# Patient Record
Sex: Female | Born: 1995 | Race: Black or African American | Hispanic: No | Marital: Single | Smoking: Never smoker
Health system: Southern US, Community
[De-identification: ages and names within clinical notes are randomized; demographics above are authoritative.]

## PROBLEM LIST (undated history)

## (undated) DIAGNOSIS — D150 Benign neoplasm of thymus: Secondary | ICD-10-CM

## (undated) DIAGNOSIS — J189 Pneumonia, unspecified organism: Secondary | ICD-10-CM

## (undated) DIAGNOSIS — D4989 Neoplasm of unspecified behavior of other specified sites: Secondary | ICD-10-CM

## (undated) DIAGNOSIS — E282 Polycystic ovarian syndrome: Secondary | ICD-10-CM

## (undated) DIAGNOSIS — G7000 Myasthenia gravis without (acute) exacerbation: Secondary | ICD-10-CM

## (undated) DIAGNOSIS — J40 Bronchitis, not specified as acute or chronic: Secondary | ICD-10-CM

## (undated) HISTORY — DX: Benign neoplasm of thymus: D15.0

## (undated) HISTORY — DX: Neoplasm of unspecified behavior of other specified sites: D49.89

## (undated) HISTORY — PX: OTHER SURGICAL HISTORY: SHX169

## (undated) HISTORY — DX: Myasthenia gravis without (acute) exacerbation: G70.00

## (undated) HISTORY — DX: Polycystic ovarian syndrome: E28.2

---
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
## 2016-02-04 ENCOUNTER — Encounter (HOSPITAL_COMMUNITY): Payer: Self-pay | Admitting: Emergency Medicine

## 2016-02-04 DIAGNOSIS — N946 Dysmenorrhea, unspecified: Secondary | ICD-10-CM | POA: Insufficient documentation

## 2016-02-04 LAB — URINE MICROSCOPIC-ADD ON

## 2016-02-04 LAB — COMPREHENSIVE METABOLIC PANEL
ALK PHOS: 65 U/L (ref 38–126)
ALT: 13 U/L — AB (ref 14–54)
ANION GAP: 7 (ref 5–15)
AST: 21 U/L (ref 15–41)
Albumin: 3.6 g/dL (ref 3.5–5.0)
BUN: 7 mg/dL (ref 6–20)
CALCIUM: 9.1 mg/dL (ref 8.9–10.3)
CHLORIDE: 106 mmol/L (ref 101–111)
CO2: 23 mmol/L (ref 22–32)
CREATININE: 0.7 mg/dL (ref 0.44–1.00)
Glucose, Bld: 170 mg/dL — ABNORMAL HIGH (ref 65–99)
Potassium: 4 mmol/L (ref 3.5–5.1)
Sodium: 136 mmol/L (ref 135–145)
Total Bilirubin: 0.2 mg/dL — ABNORMAL LOW (ref 0.3–1.2)
Total Protein: 7.3 g/dL (ref 6.5–8.1)

## 2016-02-04 LAB — CBC
HCT: 39.4 % (ref 36.0–46.0)
Hemoglobin: 12.8 g/dL (ref 12.0–15.0)
MCH: 27.8 pg (ref 26.0–34.0)
MCHC: 32.5 g/dL (ref 30.0–36.0)
MCV: 85.7 fL (ref 78.0–100.0)
PLATELETS: 278 10*3/uL (ref 150–400)
RBC: 4.6 MIL/uL (ref 3.87–5.11)
RDW: 13.4 % (ref 11.5–15.5)
WBC: 6.1 10*3/uL (ref 4.0–10.5)

## 2016-02-04 LAB — URINALYSIS, ROUTINE W REFLEX MICROSCOPIC
Bilirubin Urine: NEGATIVE
Glucose, UA: NEGATIVE mg/dL
Ketones, ur: 15 mg/dL — AB
Nitrite: NEGATIVE
PROTEIN: 100 mg/dL — AB
Specific Gravity, Urine: 1.009 (ref 1.005–1.030)
pH: 6.5 (ref 5.0–8.0)

## 2016-02-04 LAB — LIPASE, BLOOD: LIPASE: 30 U/L (ref 11–51)

## 2016-02-04 LAB — POC URINE PREG, ED: PREG TEST UR: NEGATIVE

## 2016-02-04 NOTE — ED Triage Notes (Signed)
Pt. reports low abdominal pain with nausea onset today , denies emesis or diarrhea , no fever or chills.

## 2016-02-05 ENCOUNTER — Emergency Department (HOSPITAL_COMMUNITY)
Admission: EM | Admit: 2016-02-05 | Discharge: 2016-02-05 | Disposition: A | Discharge status: Home / Self Care | Payer: PRIVATE HEALTH INSURANCE | Attending: Emergency Medicine | Admitting: Emergency Medicine

## 2016-02-05 DIAGNOSIS — N946 Dysmenorrhea, unspecified: Secondary | ICD-10-CM

## 2016-02-05 MED ORDER — KETOROLAC TROMETHAMINE 60 MG/2ML IM SOLN
60.0000 mg | Freq: Once | INTRAMUSCULAR | Status: AC
  Administered 2016-02-05: 60 mg via INTRAMUSCULAR
  Filled 2016-02-05: qty 2

## 2016-02-05 NOTE — ED Provider Notes (Signed)
[facility] DEPT Provider Note   CSN: [id number] Arrival date & time: 02/04/16  2231     History   Chief Complaint Chief Complaint  Patient presents with  . Abdominal Pain    HPI Suzanne Nguyen is a 20 y.o. female.  HPI   Patient to the ER with PMH of bronchitis and pneumonia.  She has hx of PCOS, says her PCP put her on birth control 1 month ago and this is her first menstrual cycle. She is concerned because she has been having cramping with her periods, while in the shower she passed a large clot and is concerned her so she came to the ER. She has not continued to bleed a lot and she has not had any more significant cramping. She has not had vaginal discharge or n/v/d. She has never had sex before.   Past Medical History:  Diagnosis Date  . Bronchitis   . Pneumonia     There are no active problems to display for this patient.   History reviewed. No pertinent surgical history.  OB History    No data available       Home Medications    Prior to Admission medications   Medication Sig Start Date End Date Taking? Authorizing Provider  acetaminophen (TYLENOL) 500 MG tablet Take 1,000 mg by mouth every 6 (six) hours as needed for mild pain.   Yes Historical Provider, MD  albuterol (PROVENTIL HFA;VENTOLIN HFA) 108 (90 BASE) MCG/ACT inhaler Inhale 2 puffs into the lungs every 4 (four) hours as needed for wheezing or shortness of breath. 04/06/15  Yes Melony Overly, MD  desogestrel-ethinyl estradiol (APRI) 0.15-30 MG-MCG tablet Take 1 tablet by mouth daily.   Yes Historical Provider, MD  Multiple Vitamin (MULTIVITAMIN WITH MINERALS) TABS tablet Take 1 tablet by mouth daily.   Yes Historical Provider, MD    Family History No family history on file.  Social History Social History  Substance Use Topics  . Smoking status: Never Smoker  . Smokeless tobacco: Not on file  . Alcohol use No     Allergies   Review of patient's allergies indicates no known  allergies.   Review of Systems Review of Systems Review of Systems All other systems negative except as documented in the HPI. All pertinent positives and negatives as reviewed in the HPI.   Physical Exam Updated Vital Signs BP 143/93 (BP Location: Left Arm)   Pulse 76   Temp 98.3 F (36.8 C) (Oral)   Resp 16   Ht 5\' 3"  (1.6 m)   Wt 98 kg   LMP 02/02/2016   SpO2 97%   BMI 38.26 kg/m   Physical Exam  Constitutional: She appears well-developed and well-nourished. No distress.  HENT:  Head: Normocephalic and atraumatic.  Eyes: Pupils are equal, round, and reactive to light.  Neck: Normal range of motion. Neck supple.  Cardiovascular: Normal rate and regular rhythm.   Pulmonary/Chest: Effort normal.  Abdominal: Soft.  Neurological: She is alert.  Skin: Skin is warm and dry.  Nursing note and vitals reviewed.    ED Treatments / Results  Labs (all labs ordered are listed, but only abnormal results are displayed) Labs Reviewed  COMPREHENSIVE METABOLIC PANEL - Abnormal; Notable for the following:       Result Value   Glucose, Bld 170 (*)    ALT 13 (*)    Total Bilirubin 0.2 (*)    All other components within normal limits  URINALYSIS, ROUTINE W REFLEX MICROSCOPIC (  NOT AT [facility]) - Abnormal; Notable for the following:    Color, Urine RED (*)    APPearance CLOUDY (*)    Hgb urine dipstick LARGE (*)    Ketones, ur 15 (*)    Protein, ur 100 (*)    Leukocytes, UA SMALL (*)    All other components within normal limits  URINE MICROSCOPIC-ADD ON - Abnormal; Notable for the following:    Squamous Epithelial / LPF 0-5 (*)    Bacteria, UA RARE (*)    All other components within normal limits  WET PREP, GENITAL  LIPASE, BLOOD  CBC  POC URINE PREG, ED  GC/CHLAMYDIA PROBE AMP ([place]) NOT AT [facility]    EKG  EKG Interpretation None       Radiology No results found.  Procedures Procedures (including critical care time)  Medications Ordered in ED Medications   ketorolac (TORADOL) injection 60 mg (not administered)     Initial Impression / Assessment and Plan / ED Course  I have reviewed the triage vital signs and the nursing notes.  Pertinent labs & imaging results that were available during my care of the patient were reviewed by me and considered in my medical decision making (see chart for details).  Clinical Course   Patient says she is afraid a pelvic will hurt and refuses pelvic exam, she also refuses to do single swabs instead of speculum because she said she is afraid it will hurt and says the process "is too weird". Her lab work is unremarkable. I advise the patient to call her PCP on Monday to let them know she is cramping. I recommend Ibuprofen and Tylenol for pain. -- discussed with Dr. Tamera Punt prior to discharge.  Medications  ketorolac (TORADOL) injection 60 mg (not administered)   I discussed results, diagnoses and plan with Henry Bias. They voice there understanding and questions were answered. We discussed follow-up recommendations and return precautions.   Final Clinical Impressions(s) / ED Diagnoses   Final diagnoses:  Dysmenorrhea    New Prescriptions New Prescriptions   No medications on file     Delos Haring, PA-C 02/05/16 0151    Malvin Johns, MD 02/05/16 [phone]

## 2016-07-11 ENCOUNTER — Encounter: Payer: Self-pay | Admitting: Physician Assistant

## 2016-07-11 ENCOUNTER — Ambulatory Visit (INDEPENDENT_AMBULATORY_CARE_PROVIDER_SITE_OTHER): Payer: Managed Care, Other (non HMO) | Admitting: Physician Assistant

## 2016-07-11 VITALS — BP 106/76 | HR 79 | Temp 98.3°F | Resp 20 | Ht 64.0 in | Wt 217.4 lb

## 2016-07-11 DIAGNOSIS — E282 Polycystic ovarian syndrome: Secondary | ICD-10-CM

## 2016-07-11 MED ORDER — DESOGESTREL-ETHINYL ESTRADIOL 0.15-30 MG-MCG PO TABS: 1.0000 | ORAL_TABLET | Freq: Every day | ORAL | 4 refills | Status: DC

## 2016-07-11 NOTE — Patient Instructions (Signed)
My fitness PAL to help with eating   Sign up for mychart as that will be a way for you to contact me if you need me.

## 2016-07-11 NOTE — Progress Notes (Signed)
   Suzanne Nguyen  MRN: [id number] DOB: 10-10-1995  Subjective:  Pt presents to clinic for birth control refill - she is on OCP for PCOS - it controls her menses and helps decrease her flow due to monthly cycles.  She has never been sexually active.  She has not been able to get back with the gyn who started her OCP - she ran out of her OCP in November and she has had really heavy menses since then.  She has started to exercise and she is having chest wall pain with movement that is intermittent and she is interested in getting some guidance on good eating habits.  Review of Systems  Constitutional: Negative for chills and fever.  Genitourinary: Positive for menstrual problem. Negative for dysuria, frequency, urgency and vaginal discharge.   Patient Active Problem List   Diagnosis Date Noted  . Polycystic ovarian syndrome 12/27/2015  . Hirsutism 12/24/2015    Current Outpatient Prescriptions on File Prior to Visit  Medication Sig Dispense Refill  . albuterol (PROVENTIL HFA;VENTOLIN HFA) 108 (90 BASE) MCG/ACT inhaler Inhale 2 puffs into the lungs every 4 (four) hours as needed for wheezing or shortness of breath. 1 Inhaler 0  . Multiple Vitamin (MULTIVITAMIN WITH MINERALS) TABS tablet Take 1 tablet by mouth daily.     No current facility-administered medications on file prior to visit.     No Known Allergies  Pt patients past, family and social history were reviewed and updated.   Objective:  BP 106/76 (BP Location: Left Arm, Patient Position: Sitting, Cuff Size: Large)   Pulse 79   Temp 98.3 F (36.8 C)   Resp 20   Ht 5\' 4"  (1.626 m)   Wt 217 lb 6.4 oz (98.6 kg)   LMP 07/04/2015   SpO2 98%   BMI 37.32 kg/m   Physical Exam  Constitutional: She is oriented to person, place, and time and well-developed, well-nourished, and in no distress.  HENT:  Head: Normocephalic and atraumatic.  Right Ear: Hearing and external ear normal.  Left Ear: Hearing and external ear normal.   Eyes: Conjunctivae are normal.  Neck: Normal range of motion.  Cardiovascular: Normal rate, regular rhythm and normal heart sounds.   No murmur heard. Pulmonary/Chest: Effort normal and breath sounds normal. She has no wheezes.  Neurological: She is alert and oriented to person, place, and time. Gait normal.  Skin: Skin is warm and dry.  Psychiatric: Mood, memory, affect and judgment normal.  Vitals reviewed.   Assessment and Plan :  PCOS (polycystic ovarian syndrome) - Plan: desogestrel-ethinyl estradiol (APRI) 0.15-30 MG-MCG tablet - refill OCP - pt has been out but she can do a quick start and that was discussed with patient - she was given guidance on healthy eating esp for weight loss - she should continue to exercise and try to increase her water as that has helped that pain she has had in the past.  Windell Hummingbird PA-C  [facility] 07/11/2016 6:14 PM

## 2017-08-19 ENCOUNTER — Encounter (HOSPITAL_COMMUNITY): Payer: Self-pay | Admitting: *Deleted

## 2017-08-19 ENCOUNTER — Other Ambulatory Visit: Payer: Self-pay

## 2017-08-19 ENCOUNTER — Emergency Department (HOSPITAL_COMMUNITY)
Admission: EM | Admit: 2017-08-19 | Discharge: 2017-08-19 | Disposition: A | Discharge status: Home / Self Care | Payer: Managed Care, Other (non HMO) | Attending: Emergency Medicine | Admitting: Emergency Medicine

## 2017-08-19 DIAGNOSIS — M6281 Muscle weakness (generalized): Secondary | ICD-10-CM | POA: Diagnosis not present

## 2017-08-19 DIAGNOSIS — Z5321 Procedure and treatment not carried out due to patient leaving prior to being seen by health care provider: Secondary | ICD-10-CM | POA: Insufficient documentation

## 2017-08-19 DIAGNOSIS — R51 Headache: Secondary | ICD-10-CM | POA: Insufficient documentation

## 2017-08-19 NOTE — ED Notes (Signed)
Patient called and no answer. 

## 2017-08-19 NOTE — ED Notes (Signed)
Called patient and no answer.

## 2017-08-19 NOTE — ED Triage Notes (Signed)
Pt states she has possible rt ring trigger finger, rt arm weakness with poor motor skills, which are not new but has not followed up with her PMD, Pt states she has a headache, pt continues to ramble in conversation and difficult to assess the reason she is here, Symptoms since Feb

## 2017-08-21 ENCOUNTER — Observation Stay (HOSPITAL_COMMUNITY)
Admission: EM | Admit: 2017-08-21 | Discharge: 2017-08-24 | Disposition: A | Discharge status: Home / Self Care | Payer: Managed Care, Other (non HMO) | Attending: Internal Medicine | Admitting: Internal Medicine

## 2017-08-21 ENCOUNTER — Emergency Department (HOSPITAL_COMMUNITY): Payer: Managed Care, Other (non HMO)

## 2017-08-21 ENCOUNTER — Encounter (HOSPITAL_COMMUNITY): Payer: Self-pay | Admitting: Emergency Medicine

## 2017-08-21 ENCOUNTER — Other Ambulatory Visit: Payer: Self-pay

## 2017-08-21 DIAGNOSIS — H539 Unspecified visual disturbance: Secondary | ICD-10-CM

## 2017-08-21 DIAGNOSIS — Z6839 Body mass index (BMI) 39.0-39.9, adult: Secondary | ICD-10-CM | POA: Insufficient documentation

## 2017-08-21 DIAGNOSIS — R296 Repeated falls: Secondary | ICD-10-CM | POA: Diagnosis not present

## 2017-08-21 DIAGNOSIS — H02409 Unspecified ptosis of unspecified eyelid: Secondary | ICD-10-CM | POA: Diagnosis present

## 2017-08-21 DIAGNOSIS — H02401 Unspecified ptosis of right eyelid: Principal | ICD-10-CM | POA: Insufficient documentation

## 2017-08-21 DIAGNOSIS — H532 Diplopia: Secondary | ICD-10-CM | POA: Diagnosis present

## 2017-08-21 DIAGNOSIS — R531 Weakness: Secondary | ICD-10-CM | POA: Diagnosis not present

## 2017-08-21 DIAGNOSIS — R2681 Unsteadiness on feet: Secondary | ICD-10-CM | POA: Diagnosis not present

## 2017-08-21 DIAGNOSIS — R222 Localized swelling, mass and lump, trunk: Secondary | ICD-10-CM | POA: Insufficient documentation

## 2017-08-21 DIAGNOSIS — E282 Polycystic ovarian syndrome: Secondary | ICD-10-CM | POA: Diagnosis present

## 2017-08-21 DIAGNOSIS — E669 Obesity, unspecified: Secondary | ICD-10-CM | POA: Diagnosis present

## 2017-08-21 DIAGNOSIS — R29898 Other symptoms and signs involving the musculoskeletal system: Secondary | ICD-10-CM | POA: Diagnosis present

## 2017-08-21 LAB — URINALYSIS, ROUTINE W REFLEX MICROSCOPIC
BILIRUBIN URINE: NEGATIVE
Glucose, UA: NEGATIVE mg/dL
Hgb urine dipstick: NEGATIVE
KETONES UR: NEGATIVE mg/dL
LEUKOCYTES UA: NEGATIVE
NITRITE: NEGATIVE
Protein, ur: NEGATIVE mg/dL
Specific Gravity, Urine: 1.005 (ref 1.005–1.030)
pH: 6 (ref 5.0–8.0)

## 2017-08-21 LAB — RAPID URINE DRUG SCREEN, HOSP PERFORMED
Amphetamines: NOT DETECTED
BENZODIAZEPINES: NOT DETECTED
Barbiturates: NOT DETECTED
COCAINE: NOT DETECTED
OPIATES: NOT DETECTED
Tetrahydrocannabinol: NOT DETECTED

## 2017-08-21 LAB — BASIC METABOLIC PANEL
Anion gap: 9 (ref 5–15)
CO2: 28 mmol/L (ref 22–32)
CREATININE: 0.59 mg/dL (ref 0.44–1.00)
Calcium: 9.7 mg/dL (ref 8.9–10.3)
Chloride: 103 mmol/L (ref 101–111)
GFR calc Af Amer: >60 mL/min (ref >60)
GFR calc non Af Amer: >60 mL/min (ref >60)
GLUCOSE: 105 mg/dL — AB (ref 65–99)
POTASSIUM: 4 mmol/L (ref 3.5–5.1)
SODIUM: 140 mmol/L (ref 135–145)

## 2017-08-21 LAB — CBC
HEMATOCRIT: 40.2 % (ref 36.0–46.0)
Hemoglobin: 13 g/dL (ref 12.0–15.0)
MCH: 27.5 pg (ref 26.0–34.0)
MCHC: 32.3 g/dL (ref 30.0–36.0)
MCV: 85.2 fL (ref 78.0–100.0)
PLATELETS: 284 10*3/uL (ref 150–400)
RBC: 4.72 MIL/uL (ref 3.87–5.11)
RDW: 13.1 % (ref 11.5–15.5)
WBC: 8.2 10*3/uL (ref 4.0–10.5)

## 2017-08-21 LAB — POC URINE PREG, ED: Preg Test, Ur: NEGATIVE

## 2017-08-21 MED ORDER — LORAZEPAM 2 MG/ML IJ SOLN
1.0000 mg | Freq: Once | INTRAMUSCULAR | Status: AC | PRN
  Administered 2017-08-21: 1 mg via INTRAMUSCULAR
  Filled 2017-08-21: qty 1

## 2017-08-21 NOTE — ED Provider Notes (Signed)
Patient placed in Quick Look pathway, seen and evaluated   Chief Complaint:   HPI:   22 year old female presenting with dizziness over the last 4 days, right eyelid lagging, bilateral diplopia and headache over the last 2 days.  She reports 2 falls since the onset of symptoms. She denies hitting her head or LOC. No history of similar.  She denies decreased hearing, tinnitus, neck pain or stiffness, N/V, fever, or chills. No recent trauma or injury.  She was seen earlier today by her PCP and was advised to come to the emergency department for further workup and evaluation.  PCP Dr. Lenox Ahr  ROS: Diplopia, Ptosis in right eye, dizziness, diplopia, headache  Physical Exam:   Gen: No distress  Neuro: Awake and Alert  Skin: Warm    Focused Exam: Symmetric tandem gait. Negative Romberg. 5/5 the bilateral upper and lower extremities.  Sensation is intact throughout. Right eyelid ptosis. PEERL. CN III palsy, cranial nerves are otherwise grossly intact.  Initiation of care has begun. The patient has been counseled on the process, plan, and necessity for staying for the completion/evaluation, and the remainder of the medical screening examination    Joanne Gavel, PA-C 08/21/17 [place], [place], DO 08/22/17 1458

## 2017-08-21 NOTE — ED Provider Notes (Addendum)
11:04 PM Patient unable to tolerate MRI to evaluate for new onset MS; will likely require more extensive sedation for this type of imaging. Case discussed with Dr. Denton Brick of [facility] regarding admission for further work up. TRH to admit and have requested formal Neurology consultation. This was discussed with Dr. Leonel Ramsay who will formally evaluate the patient in the department.  Vitals:   08/21/17 2000 08/21/17 2030 08/21/17 2223 08/21/17 2224  BP: 119/84 120/67 113/66   Pulse:  94  92  Resp:      Temp:      TempSrc:      SpO2: 100% 100%  97%     Antonietta Breach, PA-C 08/21/17 2305   12:11 AM Dr. Leonel Ramsay has seen and evaluated the patient.  He does not see indication for empiric initiation of steroids.  He does think that noncontrast CT may be good to rule out mass, but suspects this will likely be negative.  CT ordered for completion.   Antonietta Breach, PA-C 08/22/17 0012    Margette Fast, MD 08/22/17 1426

## 2017-08-21 NOTE — ED Triage Notes (Addendum)
Pt states that she has been falling lately, fell Friday, Sunday, while trying to get up, went to eye dr today for "droopy eye since Sunday" Dr. Lenox Ahr -- sent here for brain scan.  Right upper eyelid is droopy, states has double vision also

## 2017-08-21 NOTE — ED Notes (Signed)
MRI called pt coming back to room pt crying and could not tolerate MRI, PA informed

## 2017-08-21 NOTE — ED Notes (Signed)
Patient transported to MRI 

## 2017-08-21 NOTE — ED Provider Notes (Signed)
[facility] EMERGENCY DEPARTMENT Provider Note   CSN: [id number] Arrival date & time: 08/21/17  1426     History   Chief Complaint Chief Complaint  Patient presents with  . Ptosis  . Headache    HPI Suzanne Nguyen is a 22 y.o. female ith history of PCOS is sent to the ED by ophthalmologist (Dr. Lenox Ahr) for evaluation of evaluation of right eye lid ptosis and double vision . Patient states that on March 7 she felt like her right eye vision was doubled and blurred, felt like her right eye was crossed.  States she was doing her makeup and noticed that she couldn't focus her vision out of her right eye. Three days later when she was driving again she noticed that she had difficulty focusing both of her eyes on the road, had to keep closing one of her eyes to focus her vision on the road.   Since patient has noticed a right-sided headache described as "pressure". Since January she has noted more right upper extremity weakness, states she has a hard time picking heavy objects with her right hand.  Noticed numbness to her right ring finger, intermittent. Today she tried to pick a quarter off the ground but couldn't grab it with her right hand, felt weak and fell. She has been falling more frequently since January.   No fevers, chills, neck pain or stiffness, chest pain, shortness of breath, nausea, vomiting, unilateral numbness. No history of DVT/PE. She is on oral birth control medications. HPI  Past Medical History:  Diagnosis Date  . Bronchitis   . PCOS (polycystic ovarian syndrome)   . Pneumonia     Patient Active Problem List   Diagnosis Date Noted  . Polycystic ovarian syndrome 12/27/2015  . Hirsutism 12/24/2015    History reviewed. No pertinent surgical history.  OB History    No data available       Home Medications    Prior to Admission medications   Medication Sig Start Date End Date Taking? Authorizing Provider  ibuprofen (ADVIL,MOTRIN)  200 MG tablet Take 200 mg by mouth every 6 (six) hours as needed for mild pain.   Yes [provider]  albuterol (PROVENTIL HFA;VENTOLIN HFA) 108 (90 BASE) MCG/ACT inhaler Inhale 2 puffs into the lungs every 4 (four) hours as needed for wheezing or shortness of breath. Patient not taking: Reported on 08/21/2017 04/06/15   Melony Overly, MD  desogestrel-ethinyl estradiol (APRI) 0.15-30 MG-MCG tablet Take 1 tablet by mouth daily. Patient not taking: Reported on 08/21/2017 07/11/16   Mancel Bale, PA-C    Family History Family History  Problem Relation Age of Onset  . Gastric cancer Mother   . Hypertension Mother   . Diabetes Father   . Hypertension Father   . Heart disease Father   . Cancer Maternal Grandmother   . Heart disease Maternal Grandmother   . Lung cancer Maternal Grandfather   . Heart disease Maternal Grandfather   . Stroke Maternal Grandfather   . Diabetes Maternal Grandfather   . Diabetes Paternal Grandmother     Social History Social History   Tobacco Use  . Smoking status: Never Smoker  . Smokeless tobacco: Never Used  Substance Use Topics  . Alcohol use: No  . Drug use: No     Allergies   Patient has no known allergies.   Review of Systems Review of Systems  Eyes: Positive for visual disturbance.       Right  eye lag  Neurological: Positive for weakness and numbness.  All other systems reviewed and are negative.    Physical Exam Updated Vital Signs BP 120/67   Pulse 94   Temp 98.1 F (36.7 C)   Resp 17   SpO2 100%   Physical Exam  Constitutional: She is oriented to person, place, and time. She appears well-developed and well-nourished. No distress.  Non toxic  HENT:  Head: Normocephalic and atraumatic.  Nose: Nose normal.  Mouth/Throat: No oropharyngeal exudate.  Moist mucous membranes   Eyes: Conjunctivae and EOM are normal. Pupils are equal, round, and reactive to light.  Right eye ptosis. PERRL. No nystagmus.   Neck: Normal  range of motion.  Cardiovascular: Normal rate, regular rhythm and intact distal pulses.  No murmur heard. 2+ DP and radial pulses bilaterally. No LE edema.   Pulmonary/Chest: Effort normal and breath sounds normal. No respiratory distress. She has no wheezes. She has no rales.  Abdominal: Soft. Bowel sounds are normal. There is no tenderness.  No G/R/R. No suprapubic or CVA tenderness.   Musculoskeletal: Normal range of motion. She exhibits no deformity.  Neurological: She is alert and oriented to person, place, and time.  Alert and oriented to self, place, time and event.  Speech is fluent without obvious dysarthria or dysphasia. Strength 5/5 with hand grip and ankle F/E.   Sensation to light touch intact in hands and feet. Normal gait. Normal tandem walk. No pronator drift. No leg drop.  Normal finger-to-nose and finger tapping.  CN I and VIII not tested. CN II-XII grossly intact bilaterally, R eyelid ptosis. Knee DTR symmetric. No ankle clonus.   Skin: Skin is warm and dry. Capillary refill takes less than 2 seconds.  Psychiatric: She has a normal mood and affect. Her behavior is normal. Judgment and thought content normal.  Nursing note and vitals reviewed.    ED Treatments / Results  Labs (all labs ordered are listed, but only abnormal results are displayed) Labs Reviewed  BASIC METABOLIC PANEL - Abnormal; Notable for the following components:      Result Value   Glucose, Bld 105 (*)    BUN <5 (*)    All other components within normal limits  URINALYSIS, ROUTINE W REFLEX MICROSCOPIC - Abnormal; Notable for the following components:   Color, Urine STRAW (*)    All other components within normal limits  CBC  RAPID URINE DRUG SCREEN, HOSP PERFORMED  POC URINE PREG, ED    EKG  EKG Interpretation None       Radiology No results found.  Procedures Procedures (including critical care time)  Medications Ordered in ED Medications  LORazepam (ATIVAN) injection 1 mg  (1 mg Intramuscular Given 08/21/17 2113)     Initial Impression / Assessment and Plan / ED Course  I have reviewed the triage vital signs and the nursing notes.  Pertinent labs & imaging results that were available during my care of the patient were reviewed by me and considered in my medical decision making (see chart for details).    Given constellation of symptoms, concern for MS.  Neuro exam grossly intact but pt has obvious right ptosis. Lab work, urine, urine drug screen reviewed and unremarkable. Awaiting MRI brain without contrast.  Will hand off patient to oncoming EDP who will f/u on MRI results, reassess and determine disposition.   Final Clinical Impressions(s) / ED Diagnoses   Final diagnoses:  None    ED Discharge Orders    None  Kinnie Feil, PA-C 08/21/17 2157    Carmin Muskrat, MD 08/23/17 [phone]

## 2017-08-22 ENCOUNTER — Encounter (HOSPITAL_COMMUNITY): Payer: Self-pay | Admitting: *Deleted

## 2017-08-22 ENCOUNTER — Observation Stay (HOSPITAL_COMMUNITY): Payer: Managed Care, Other (non HMO)

## 2017-08-22 ENCOUNTER — Emergency Department (HOSPITAL_COMMUNITY): Payer: Managed Care, Other (non HMO)

## 2017-08-22 DIAGNOSIS — R29898 Other symptoms and signs involving the musculoskeletal system: Secondary | ICD-10-CM

## 2017-08-22 DIAGNOSIS — H02401 Unspecified ptosis of right eyelid: Secondary | ICD-10-CM

## 2017-08-22 DIAGNOSIS — H02409 Unspecified ptosis of unspecified eyelid: Secondary | ICD-10-CM | POA: Diagnosis present

## 2017-08-22 DIAGNOSIS — H539 Unspecified visual disturbance: Secondary | ICD-10-CM | POA: Diagnosis not present

## 2017-08-22 DIAGNOSIS — H532 Diplopia: Secondary | ICD-10-CM

## 2017-08-22 DIAGNOSIS — E669 Obesity, unspecified: Secondary | ICD-10-CM | POA: Diagnosis present

## 2017-08-22 LAB — TSH: TSH: 0.98 u[IU]/mL (ref 0.350–4.500)

## 2017-08-22 LAB — SEDIMENTATION RATE: SED RATE: 28 mm/h — AB (ref 0–22)

## 2017-08-22 LAB — HIV ANTIBODY (ROUTINE TESTING W REFLEX): HIV Screen 4th Generation wRfx: NONREACTIVE

## 2017-08-22 LAB — C-REACTIVE PROTEIN: CRP: 3.3 mg/dL — AB (ref <1.0)

## 2017-08-22 MED ORDER — ALBUTEROL SULFATE (2.5 MG/3ML) 0.083% IN NEBU: 2.5000 mg | INHALATION_SOLUTION | Freq: Four times a day (QID) | RESPIRATORY_TRACT | Status: DC | PRN

## 2017-08-22 MED ORDER — GADOBENATE DIMEGLUMINE 529 MG/ML IV SOLN
20.0000 mL | Freq: Once | INTRAVENOUS | Status: AC | PRN
  Administered 2017-08-22: 20 mL via INTRAVENOUS

## 2017-08-22 MED ORDER — ENOXAPARIN SODIUM 40 MG/0.4ML ~~LOC~~ SOLN
40.0000 mg | SUBCUTANEOUS | Status: DC
  Administered 2017-08-24: 40 mg via SUBCUTANEOUS
  Filled 2017-08-22 (×2): qty 0.4

## 2017-08-22 MED ORDER — IOPAMIDOL (ISOVUE-370) INJECTION 76%
INTRAVENOUS | Status: AC
  Administered 2017-08-22: 50 mL
  Filled 2017-08-22: qty 50

## 2017-08-22 MED ORDER — ALBUTEROL SULFATE HFA 108 (90 BASE) MCG/ACT IN AERS: 2.0000 | INHALATION_SPRAY | RESPIRATORY_TRACT | Status: DC | PRN

## 2017-08-22 MED ORDER — ENOXAPARIN SODIUM 40 MG/0.4ML ~~LOC~~ SOLN: 40.0000 mg | SUBCUTANEOUS | Status: DC

## 2017-08-22 NOTE — H&P (Addendum)
History and Physical    Suzanne Nguyen DOB: 1996-04-03 DOA: 08/21/2017  PCP: Patient, No Pcp Per   Patient coming from: Home  Chief Complaint: Right eyelid droop, diplopia, right hand weakness and falls  HPI: Suzanne Nguyen is a 22 y.o. female with medical history significant for PCOs, who presented to the ED with complaints of droopiness of the right eye for 5 days duration that has progressively worsened, also with reported double vision.  Also dizziness of 4 days duration with subsequent falls twice over the past 4 days.  Patient also reports a headache over the past 2 days that has improved.  Patient reports a right upper extremity with heaviness that has improved.  Patient was in the ED 317/19 for left.  Was to see an ophthalmologist today for droopiness of the right eye patient was subsequently sent to the ED for brain imaging. Patient denies weakness of her other extremities. Patient denies family history of multiple sclerosis, or similar symptoms in relatives.  No symptoms to suggest recent viral illness.  Patient very occasional alcohol intake, denies IV drug use.  ED Course: Stable vitals, BMP CBC unremarkable, clean UA, UDS negative.  CT head without contrast-partially empty pituitary sella, no acute intracranial abnormality.  MRI could not be obtained-patient claustrophobic, and after 1 mg of Ativan given, which patient said did not help with anxiety.  Neurology was consulted in the ED, recommendations for MRI brain with and without contrast, ESR, CRP, ANA.   Review of Systems: As per HPI otherwise 10 point review of systems negative.   Past Medical History:  Diagnosis Date  . Bronchitis   . PCOS (polycystic ovarian syndrome)   . Pneumonia    History reviewed. No pertinent surgical history.   reports that  has never smoked. she has never used smokeless tobacco. She reports that she does not drink alcohol or use drugs.  No Known Allergies  Family  History  Problem Relation Age of Onset  . Gastric cancer Mother   . Hypertension Mother   . Diabetes Father   . Hypertension Father   . Heart disease Father   . Cancer Maternal Grandmother   . Heart disease Maternal Grandmother   . Lung cancer Maternal Grandfather   . Heart disease Maternal Grandfather   . Stroke Maternal Grandfather   . Diabetes Maternal Grandfather   . Diabetes Paternal Grandmother     Prior to Admission medications   Medication Sig Start Date End Date Taking? Authorizing Provider  ibuprofen (ADVIL,MOTRIN) 200 MG tablet Take 200 mg by mouth every 6 (six) hours as needed for mild pain.   Yes [provider]  albuterol (PROVENTIL HFA;VENTOLIN HFA) 108 (90 BASE) MCG/ACT inhaler Inhale 2 puffs into the lungs every 4 (four) hours as needed for wheezing or shortness of breath. Patient not taking: Reported on 08/21/2017 04/06/15   Melony Overly, MD  desogestrel-ethinyl estradiol (APRI) 0.15-30 MG-MCG tablet Take 1 tablet by mouth daily. Patient not taking: Reported on 08/21/2017 07/11/16   Mancel Bale, PA-C    Physical Exam: Vitals:   08/21/17 2230 08/21/17 2330 08/22/17 0000 08/22/17 0015  BP: 120/62 133/73 (!) 139/96 (!) 128/95  Pulse: 80 91 91 89  Resp:      Temp:      TempSrc:      SpO2: 96% 98% 99% 94%    Constitutional: calm, comfortable, obese  Vitals:   08/21/17 2230 08/21/17 2330 08/22/17 0000 08/22/17 0015  BP: 120/62 133/73 (!) 139/96 Marland Kitchen)  128/95  Pulse: 80 91 91 89  Resp:      Temp:      TempSrc:      SpO2: 96% 98% 99% 94%   Eyes: PERRL, Mild- mod ptosis right eye, and conjunctivae normal ENMT: Mucous membranes are moist. Posterior pharynx clear of any exudate or lesions.Normal dentition.  Neck: normal, supple, no masses, no thyromegaly Respiratory: clear to auscultation bilaterally, no wheezing, no crackles. Normal respiratory effort. No accessory muscle use.  Cardiovascular: Regular rate and rhythm, no murmurs / rubs / gallops. No  extremity edema. 2+ pedal pulses. No carotid bruits.  Abdomen: no tenderness, no masses palpated. No hepatosplenomegaly. Bowel sounds positive.  Musculoskeletal: no clubbing / cyanosis. No joint deformity upper and lower extremities. Good ROM, no contractures. Normal muscle tone.  Skin: no rashes, lesions, ulcers. No induration Neurologic: ptosis of right eye, otherwise no gross cranial nerve abnormalities. Sensation intact, DTR normal. Strength 5/5 in all 4.  Finger to nose intact Psychiatric: Normal judgment and insight. Alert and oriented x 3. Normal mood.   Labs on Admission: I have personally reviewed following labs and imaging studies  CBC: Recent Labs  Lab 08/21/17 1556  WBC 8.2  HGB 13.0  HCT 40.2  MCV 85.2  PLT 449   Basic Metabolic Panel: Recent Labs  Lab 08/21/17 1556  NA 140  K 4.0  CL 103  CO2 28  GLUCOSE 105*  BUN <5*  CREATININE 0.59  CALCIUM 9.7   Urine analysis:    Component Value Date/Time   COLORURINE STRAW (A) 08/21/2017 1800   APPEARANCEUR CLEAR 08/21/2017 1800   LABSPEC 1.005 08/21/2017 1800   PHURINE 6.0 08/21/2017 1800   GLUCOSEU NEGATIVE 08/21/2017 1800   HGBUR NEGATIVE 08/21/2017 1800   BILIRUBINUR NEGATIVE 08/21/2017 1800   KETONESUR NEGATIVE 08/21/2017 1800   PROTEINUR NEGATIVE 08/21/2017 1800   NITRITE NEGATIVE 08/21/2017 1800   LEUKOCYTESUR NEGATIVE 08/21/2017 1800    Radiological Exams on Admission: Ct Head Wo Contrast  Result Date: 08/22/2017 CLINICAL DATA:  Drooping right eyelid and double vision since Sunday. EXAM: CT HEAD WITHOUT CONTRAST TECHNIQUE: Contiguous axial images were obtained from the base of the skull through the vertex without intravenous contrast. COMPARISON:  None. FINDINGS: BRAIN: The ventricles and sulci are normal. No intraparenchymal hemorrhage, mass effect nor midline shift. No acute large vascular territory infarcts. Grey-white matter distinction is maintained. The basal ganglia are unremarkable. No abnormal  extra-axial fluid collections. Basal cisterns are not effaced and midline. The brainstem and cerebellar hemispheres are without acute abnormalities. Anatomic variant partially empty pituitary sella. VASCULAR: Unremarkable. SKULL/SOFT TISSUES: No skull fracture. No significant soft tissue swelling. ORBITS/SINUSES: The included ocular globes and orbital contents are normal.The mastoid air cells are clear. The included paranasal sinuses are well-aerated. OTHER: None. IMPRESSION: Anatomic variant partially empty pituitary sella. No acute intracranial abnormality. Electronically Signed   By: Ashley Royalty M.D.   On: 08/22/2017 01:21    EKG: None  Assessment/Plan Principal Problem:   Right hand weakness Active Problems:   Ptosis- right eye   Diplopia   Obesity (BMI 35.0-39.9 without comorbidity)   Right hand weakness-resolving but with persistent ptosis, also diplopia, and falls.  Differentials include multiple sclerosis-and denies family history of, autoimmune disease.  Head CT negative for acute abnormality, partially empty sella.  MRI not obtained due to claustrophobia.  - Neuro recommendations appreciated- ANA, CRP, ESR, brain MRI w wo contrast - Pt would like to try MRI brain in a.m, '1mg'$  IM Ativan was  not effective. - PT eval   DVT prophylaxis: Lovenox Code Status: Full Family Communication: None at bedside  Disposition Plan: > 1 day Consults called: Neurology Admission status: Obs, med surg   Bethena Roys MD [facility] Pager [phone] From 6PM-2AM.  Otherwise please contact night-coverage [email or web address] Password [email or web address]  08/22/2017, 1:09 AM

## 2017-08-22 NOTE — ED Notes (Signed)
Patient transported to CT 

## 2017-08-22 NOTE — Progress Notes (Signed)
Patient admitted after midnight, please see H&P.  MRI negative except partially empty sella.  CT scan of neck pending.  Multiple lab studies pending.  Patient is a Electronics engineer.  PT/OT pending Denies chiropractor visits.  Eulogio Bear DO

## 2017-08-22 NOTE — Consult Note (Addendum)
Neurology Consultation Reason for Consult: Facial weakness Referring Physician: Vanita Panda, R  CC: Facial weakness  History is obtained from: Patient  HPI: Suzanne Nguyen is a 22 y.o. female he states that she has been having some unusual symptoms for the past few weeks.  Around March 1, she noticed that her face that seem to be completely normal.  Over the last few days, her right eyelid has been drooping and she has been complaining of some diplopia.  She does have a history of migraines and has had increased frequency in these migraines over the past few weeks.  She also noticed that she had some numbness in her right hand as well.  She denies any history of vision changes suggestive of optic neuritis, episodes of numbness or weakness, Lhermitte's sign.  She denies any history of recent illness.  ROS: A 14 point ROS was performed and is negative except as noted in the HPI.   Past Medical History:  Diagnosis Date  . Bronchitis   . PCOS (polycystic ovarian syndrome)   . Pneumonia      Family History  Problem Relation Age of Onset  . Gastric cancer Mother   . Hypertension Mother   . Diabetes Father   . Hypertension Father   . Heart disease Father   . Cancer Maternal Grandmother   . Heart disease Maternal Grandmother   . Lung cancer Maternal Grandfather   . Heart disease Maternal Grandfather   . Stroke Maternal Grandfather   . Diabetes Maternal Grandfather   . Diabetes Paternal Grandmother      Social History:  reports that  has never smoked. she has never used smokeless tobacco. She reports that she does not drink alcohol or use drugs.   Exam: Current vital signs: BP (!) 128/95   Pulse 89   Temp 98.1 F (36.7 C)   Resp 17   SpO2 94%  Vital signs in last 24 hours: Temp:  [98.1 F (36.7 C)] 98.1 F (36.7 C) (03/19 1717) Pulse Rate:  [68-94] 89 (03/20 0015) Resp:  [17] 17 (03/19 1534) BP: (101-141)/(57-96) 128/95 (03/20 0015) SpO2:  [94 %-100 %] 94 %  (03/20 0015)   Physical Exam  Constitutional: Appears well-developed and well-nourished.  Psych: Affect appropriate to situation Eyes: No scleral injection HENT: No OP obstrucion Head: Normocephalic.  Cardiovascular: Normal rate and regular rhythm.  Respiratory: Effort normal, non-labored breathing GI: Soft.  No distension. There is no tenderness.  Skin: WDI  Neuro: Mental Status: Patient is awake, alert, oriented to person, place, month, year, and situation. Patient is able to give a clear and coherent history. No signs of aphasia or neglect Cranial Nerves: II: Visual Fields are full. Pupils are equal, round, and reactive to light.   III,IV, VI: She has impaired adduction of the left eye when looking to the right. V: Facial sensation is symmetric to temperature VII: Facial movement with mild forehead, as well as lower facial weakness VIII: hearing is intact to voice X: Uvula elevates symmetrically XI: Shoulder shrug is symmetric. XII: tongue is midline without atrophy or fasciculations.  Motor: Tone is normal. Bulk is normal.  She has decreased strength in her left triceps, otherwise good strength throughout Sensory: Sensation is diminished on the ulnar aspect of the right hand. Deep Tendon Reflexes: 2+ and symmetric in the biceps and patellae.  Plantars: Toes are downgoing bilaterally.  Cerebellar: FNF and HKS are intact bilaterally   I have reviewed labs in epic and the results pertinent to  this consultation are: BMP-unremarkable  I have reviewed the images obtained:CT head - no acute findings.    Impression: 22 year old female with vague sensory complaints as well as facial weakness. Possibilities include multiple sclerosis, sarcoid, Autoimmune disease. MRI would be the most helpful study here, but unfortunately she is not able to tolerate it due to claustrophobia.  Recommendations: 1) MRI brain with and without contrast 2) ESR, CRP, ANA, SSA, SSB, ACE 3) further  recommendations based on imaging results   Roland Rack, MD [facility] [phone]  If 7pm- 7am, please page neurology on call as listed in [place].

## 2017-08-22 NOTE — ED Notes (Addendum)
Informed pt that IV is normally required to go up to floor, pt does not need any IV medications at this time and does not want IV placed. Pt states that if she needs an IV later for medications she will accept at the floor

## 2017-08-22 NOTE — ED Notes (Signed)
Patient transported to MRI 

## 2017-08-22 NOTE — Progress Notes (Addendum)
Neurology Progress Note  S:// Doing well this morning. Feels that her double vision and drooping eyelid have not improved. No other complaints.   O:// Current vital signs: Vitals:   08/22/17 0600 08/22/17 0700  BP: 109/78 125/83  Pulse: 84 95  Resp: 14 16  Temp:    SpO2: 97% 98%    Vital signs in last 24 hours: Temp:  [98.1 F (36.7 C)] 98.1 F (36.7 C) (03/19 1717) Pulse Rate:  [68-95] 95 (03/20 0700) Resp:  [14-17] 16 (03/20 0700) BP: (101-141)/(57-96) 125/83 (03/20 0700) SpO2:  [94 %-100 %] 98 % (03/20 0700)  GENERAL: Awake, alert in NAD HEENT: - Normocephalic and atraumatic, dry mm, no LN++, Prominent thyroid LUNGS - Breathing comfortably on RA ABDOMEN - Soft, nontender, nondistended with normoactive BS Ext: warm, well perfused, intact peripheral pulses, no edema  NEURO:  Mental Status: AA&Ox3  Language: speech is fluent.  Naming, repetition, and comprehension intact. Cranial Nerves: Right ptosis. PERRL. Dysconjugate gaze, visual fields full, no facial asymmetry, facial sensation intact, hearing intact, tongue/uvula/soft palate midline, normal sternocleidomastoid and trapezius muscle strength. No evidence of tongue atrophy or fibrillations Motor: 5/5 upper and lower extremity strength bilaterally  Tone: is normal and bulk is normal Sensation- Intact to light touch bilaterally Coordination: FTN intact bilaterally, no ataxia in BLE. Gait- normal   Medications No current facility-administered medications on file prior to encounter.    Current Outpatient Medications on File Prior to Encounter  Medication Sig Dispense Refill  . ibuprofen (ADVIL,MOTRIN) 200 MG tablet Take 200 mg by mouth every 6 (six) hours as needed for mild pain.    Marland Kitchen albuterol (PROVENTIL HFA;VENTOLIN HFA) 108 (90 BASE) MCG/ACT inhaler Inhale 2 puffs into the lungs every 4 (four) hours as needed for wheezing or shortness of breath. (Patient not taking: Reported on 08/21/2017) 1 Inhaler 0  .  desogestrel-ethinyl estradiol (APRI) 0.15-30 MG-MCG tablet Take 1 tablet by mouth daily. (Patient not taking: Reported on 08/21/2017) 3 Package 4    Labs CBC    Component Value Date/Time   WBC 8.2 08/21/2017 1556   RBC 4.72 08/21/2017 1556   HGB 13.0 08/21/2017 1556   HCT 40.2 08/21/2017 1556   PLT 284 08/21/2017 1556   MCV 85.2 08/21/2017 1556   MCH 27.5 08/21/2017 1556   MCHC 32.3 08/21/2017 1556   RDW 13.1 08/21/2017 1556    CMP     Component Value Date/Time   NA 140 08/21/2017 1556   K 4.0 08/21/2017 1556   CL 103 08/21/2017 1556   CO2 28 08/21/2017 1556   GLUCOSE 105 (H) 08/21/2017 1556   BUN <5 (L) 08/21/2017 1556   CREATININE 0.59 08/21/2017 1556   CALCIUM 9.7 08/21/2017 1556   PROT 7.3 02/04/2016 2306   ALBUMIN 3.6 02/04/2016 2306   AST 21 02/04/2016 2306   ALT 13 (L) 02/04/2016 2306   ALKPHOS 65 02/04/2016 2306   BILITOT 0.2 (L) 02/04/2016 2306   GFRNONAA >60 08/21/2017 1556   GFRAA >60 08/21/2017 1556     Imaging I have reviewed images in epic and the results pertinent to this consultation are:  CT-scan of the brain IMPRESSION: Anatomic variant partially empty pituitary sella. No acute intracranial abnormality  MRI examination of the brain IMPRESSION: 1. Negative brain MRI. No acute intracranial abnormality identified. 2. Partially empty sella.  Assessment: 22 obese F with pmhx of PCOS & migraines presenting with complaints of droopy eyelid and blurry vision x 2 weeks. On exam she has right ptosis,  dysconjugate gaze, and prominent thyroid. CT and MRI are negative for any findings to explain her symptoms. CRP and SED rate are mildly elevated. CBC and CMP unremarkable. Other work up including sjogrens antibodies, angiotensin converting enzyme, ANA, and HIV ab are pending.   Impression: Unclear etiology of patient's symptoms. Unlikely MS with negative MRI. Possible complex migraine vs myasthenia gravis as patients symptoms progressively worsen throughout  the day.   Recommendations: -- F/u ANA, SSA, SSB, ACE, HIV -- TSH  -- Acetylcholine Receptor abs  -- CTA Head & Neck r/o aneurysm, dissection  -- Will follow   Velna Ochs, M.D. - PGY2 08/22/2017, 9:18 AM    Attending addendum I have seen and examined the patient with the resident this morning. Briefly 22 year old female who has been having unusual symptoms of right eyelid drooping, diplopia and right facial weakness along with increased frequency of her migraines, sent to the hospital for further evaluation from ophthalmologist. On further questioning, she says that she has some numbness in her right hand as well as her symptoms tend to get worse as the day progresses long.  Especially her ptosis and diplopia do tend to get worse with activity and with time.  She says she is at her best when she wakes up in the morning and as the day progresses, her weakness progresses. She does report that most of the times the symptoms have been associated with a headache. Her examination is significant for right eye ptosis, disconjugate gaze with inability to Adduct the left eye causing diplopia, questionable mild right full face weakness compared to the left with decreased sensation on the right hand mainly in the ulnar distribution. She is currently being worked up for autoimmune versus MS etiology for her symptoms. MRI of the brain was done.  I have personally reviewed the MRI of the brain and it is not consistent with multiple sclerosis.  It is essentially an unremarkable normal looking MRI of the brain. Given the symptoms of her ptosis and diplopia, I would favor looking at her head and neck vasculature for dissection or aneurysm. I would also check in addition to the autoimmune panel, TSH and acetylcholine receptor antibodies   Impression -Less likely MS -Evaluate for cervical arterial dissection or cerebral aneurysm - Evaluate for myasthenia gravis -Evaluate for autoimmune  disorders  Recommendations I discussed the recommendation of the resident and they are as above in the resident note. We will follow-up after the testing becomes available.  -- Amie Portland, MD [facility] Pager: [phone] If 7pm to 7am, please call on call as listed on AMION.

## 2017-08-22 NOTE — Plan of Care (Signed)
  Activity: Risk for activity intolerance will decrease 08/22/2017 1119 - Progressing by Williams Che, RN   Nutrition: Adequate nutrition will be maintained 08/22/2017 1119 - Progressing by Williams Che, RN   Elimination: Will not experience complications related to bowel motility 08/22/2017 1119 - Progressing by Williams Che, RN   Safety: Ability to remain free from injury will improve 08/22/2017 1119 - Progressing by Williams Che, RN

## 2017-08-22 NOTE — Evaluation (Addendum)
Physical Therapy Evaluation Patient Details Name: Suzanne Nguyen MRN: [id number] DOB: 1996-03-16 Today's Date: 08/22/2017   History of Present Illness  Pt is a 22 y/o female admitted secondary to R hand numbness, Ptosis of R eye, diplopia, and multiple falls. CT and MRI negative for acute abnormality, however, CT angio revealed Anterior mediastinal mass, most consistent with thymoma; supports the clinical concern for myasthenia gravis. PMH includes polycystic ovarian syndrome.   Clinical Impression  Pt admitted secondary to problem above with deficits below. PTA, pt very independent and attending college. Upon eval, pt reliant on RW and min A secondary to unsteadiness, and also presenting with visual deficits (diplopia and R eye ptosis) which limited mobility tolerance. Pt also presenting with R hand numbness/weakness. Pt with multiple falls recently at school secondary to BLE weakness, so feel pt is a fall risk. Feel pt is an excellent CIR candidate given current deficits, to increase independence to PLOF. She is very motivated to regain independence and return to school. Will continue to follow acutely to maximize functional mobility independence and safety.     Follow Up Recommendations CIR    Equipment Recommendations  Other (comment)(TBD )    Recommendations for Other Services Rehab consult;OT consult     Precautions / Restrictions Precautions Precautions: Fall Precaution Comments: Has had multiple falls at home when bending down. Reports feeling like her legs are giving out on her.  Restrictions Weight Bearing Restrictions: No      Mobility  Bed Mobility Overal bed mobility: Needs Assistance Bed Mobility: Supine to Sit;Sit to Supine     Supine to sit: Supervision Sit to supine: Supervision   General bed mobility comments: Supervision for safety.   Transfers Overall transfer level: Needs assistance Equipment used: Rolling walker (2 wheeled) Transfers: Sit to/from  Stand Sit to Stand: Min guard         General transfer comment: Min guard for steadying assist.   Ambulation/Gait Ambulation/Gait assistance: Min assist Ambulation Distance (Feet): 50 Feet Assistive device: Rolling walker (2 wheeled) Gait Pattern/deviations: Step-through pattern;Decreased stride length;Drifts right/left;Narrow base of support Gait velocity: Decreased  Gait velocity interpretation: Below normal speed for age/gender General Gait Details: Slow, very guarded gait. Pt with very narrow BOS, almost scissoring type gait. Unsteadiness noted and required use of RW and min A. Pt complaining of diplopia and dizziness which limited gait distance. Verbal cues for increasing width of BOS and cues for sequencing using RW.   Stairs            Wheelchair Mobility    Modified Rankin (Stroke Patients Only)       Balance Overall balance assessment: Needs assistance Sitting-balance support: No upper extremity supported;Feet supported Sitting balance-Leahy Scale: Fair     Standing balance support: Bilateral upper extremity supported;During functional activity Standing balance-Leahy Scale: Poor Standing balance comment: Reliant on BUE support.                              Pertinent Vitals/Pain Pain Assessment: No/denies pain    Home Living Family/patient expects to be discharged to:: Private residence Living Arrangements: Non-relatives/Friends Available Help at Discharge: Friend(s);Available PRN/intermittently;Family Type of Home: Other(Comment)(dorm at [employer] ) Home Access: Elevator     Home Layout: One level Home Equipment: None Additional Comments: Reports she could go stay with other family if needed.     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 Noted decreased grip strength.  RUE Sensation: decreased light touch    Lower Extremity Assessment Lower Extremity Assessment: Generalized weakness    Cervical / Trunk Assessment Cervical / Trunk Assessment: Normal  Communication   Communication: No difficulties  Cognition Arousal/Alertness: Awake/alert Behavior During Therapy: WFL for tasks assessed/performed Overall Cognitive Status: Within Functional Limits for tasks assessed                                        General Comments General comments (skin integrity, edema, etc.): Pt with noted ptosis of R eye; pt reports it had gotten worse as the day went on. Also reports trouble focusing and double vision. Will need more in depth visual testing.     Exercises     Assessment/Plan    PT Assessment Patient needs continued PT services  PT Problem List Decreased strength;Decreased balance;Decreased activity tolerance;Decreased mobility;Decreased knowledge of use of DME;Impaired sensation       PT Treatment Interventions DME instruction;Gait training;Stair training;Functional mobility training;Therapeutic activities;Neuromuscular re-education;Therapeutic exercise;Balance training;Patient/family education    PT Goals (Current goals can be found in the Care Plan section)  Acute Rehab PT Goals Patient Stated Goal: to get better  PT Goal Formulation: With patient Time For Goal Achievement: 09/05/17 Potential to Achieve Goals: Good    Frequency Min 3X/week   Barriers to discharge        Co-evaluation               AM-PAC PT "6 Clicks" Daily Activity  Outcome Measure Difficulty turning over in bed (including adjusting bedclothes, sheets and blankets)?: A Little Difficulty moving from lying on back to sitting on the side of the bed? : A Little Difficulty sitting down on and standing up from a chair with  arms (e.g., wheelchair, bedside commode, etc,.)?: Unable Help needed moving to and from a bed to chair (including a wheelchair)?: A Little Help needed walking in hospital room?: A Little Help needed climbing 3-5 steps with a railing? : A Lot 6 Click Score: 15    End of Session Equipment Utilized During Treatment: Gait belt Activity Tolerance: Other (comment)(limited by dizziness and double vision ) Patient left: in bed;with call bell/phone within reach Nurse Communication: Mobility status PT Visit Diagnosis: Unsteadiness on feet (R26.81);Other abnormalities of gait and mobility (R26.89);Other symptoms and signs involving the nervous system (R29.898);Difficulty in walking, not elsewhere classified (R26.2)    Time: 2694-8546 PT Time Calculation (min) (ACUTE ONLY): 23 min   Charges:   PT Evaluation $PT Eval Moderate Complexity: 1 Mod PT Treatments $Gait Training: 8-22 mins   PT G Codes:        Leighton Ruff, PT, DPT  Acute Rehabilitation Services  Pager: [phone]   Rudean Hitt 08/22/2017, 6:53 PM

## 2017-08-23 DIAGNOSIS — H02401 Unspecified ptosis of right eyelid: Secondary | ICD-10-CM | POA: Diagnosis not present

## 2017-08-23 DIAGNOSIS — E669 Obesity, unspecified: Secondary | ICD-10-CM | POA: Diagnosis not present

## 2017-08-23 DIAGNOSIS — H532 Diplopia: Secondary | ICD-10-CM | POA: Diagnosis not present

## 2017-08-23 DIAGNOSIS — R29898 Other symptoms and signs involving the musculoskeletal system: Secondary | ICD-10-CM | POA: Diagnosis not present

## 2017-08-23 LAB — ANA W/REFLEX IF POSITIVE: Anti Nuclear Antibody(ANA): NEGATIVE

## 2017-08-23 LAB — SJOGRENS SYNDROME-B EXTRACTABLE NUCLEAR ANTIBODY: SSB (La) (ENA) Antibody, IgG: <0.2 AI (ref 0.0–0.9)

## 2017-08-23 LAB — ANGIOTENSIN CONVERTING ENZYME: Angiotensin-Converting Enzyme: 62 U/L (ref 14–82)

## 2017-08-23 LAB — SJOGRENS SYNDROME-A EXTRACTABLE NUCLEAR ANTIBODY

## 2017-08-23 MED ORDER — PYRIDOSTIGMINE BROMIDE 60 MG PO TABS
30.0000 mg | ORAL_TABLET | Freq: Three times a day (TID) | ORAL | Status: DC
  Administered 2017-08-23 – 2017-08-24 (×4): 30 mg via ORAL
  Filled 2017-08-23 (×5): qty 0.5

## 2017-08-23 NOTE — Progress Notes (Signed)
Physical Therapy Treatment Patient Details Name: Suzanne Nguyen MRN: [id number] DOB: 04-25-1996 Today's Date: 08/23/2017    History of Present Illness Pt is a 22 y/o female admitted secondary to R hand numbness, Ptosis of R eye, diplopia, and multiple falls. CT and MRI negative for acute abnormality, however, CT angio revealed Anterior mediastinal mass, most consistent with thymoma; supports the clinical concern for myasthenia gravis. PMH includes polycystic ovarian syndrome.     PT Comments    Patient is progressing well toward mobility goals. Pt scored 19/24 on DGI. Pt continues to present with R ptosis and reported feeling of "lock jaw" at times. Pt did report that her vision appears to have improved and with no c/o diplopia this session. Recommending Outpatient PT upon d/c.    Follow Up Recommendations  Outpatient PT     Equipment Recommendations  None recommended by PT    Recommendations for Other Services Rehab consult;OT consult     Precautions / Restrictions Precautions Precautions: Fall Precaution Comments: Has had multiple falls at home when bending down.   Restrictions Weight Bearing Restrictions: No    Mobility  Bed Mobility Overal bed mobility: Independent             General bed mobility comments: HOB elevated, no assist needed   Transfers Overall transfer level: Independent   Transfers: Sit to/from Stand Sit to Stand: Supervision         General transfer comment: supervision for safety, stood from EOB and toilet   Ambulation/Gait Ambulation/Gait assistance: Modified independent (Device/Increase time) Ambulation Distance (Feet): 220 Feet Assistive device: Rolling walker (2 wheeled) Gait Pattern/deviations: Step-through pattern;Decreased stride length   Gait velocity interpretation: Below normal speed for age/gender General Gait Details: guarded movements; no LOB noted   Stairs Stairs: Yes   Stair Management: One rail Left;Alternating  pattern;Forwards Number of Stairs: 6    Wheelchair Mobility    Modified Rankin (Stroke Patients Only)       Balance Overall balance assessment: Needs assistance Sitting-balance support: No upper extremity supported;Feet supported Sitting balance-Leahy Scale: Good     Standing balance support: Bilateral upper extremity supported;During functional activity Standing balance-Leahy Scale: Fair Standing balance comment: pt completing functional mobility at supervision-minguard level                  Standardized Balance Assessment Standardized Balance Assessment : Dynamic Gait Index   Dynamic Gait Index Level Surface: Normal Change in Gait Speed: Mild Impairment Gait with Horizontal Head Turns: Mild Impairment Gait with Vertical Head Turns: Mild Impairment Gait and Pivot Turn: Normal Step Over Obstacle: Mild Impairment Step Around Obstacles: Normal Steps: Mild Impairment Total Score: 19      Cognition Arousal/Alertness: Awake/alert Behavior During Therapy: WFL for tasks assessed/performed Overall Cognitive Status: Within Functional Limits for tasks assessed                                        Exercises      General Comments        Pertinent Vitals/Pain Pain Assessment: No/denies pain    Home Living Family/patient expects to be discharged to:: Private residence Living Arrangements: Non-relatives/Friends Available Help at Discharge: Friend(s);Available PRN/intermittently;Family Type of Home: Other(Comment)(dorm at [employer]) Home Access: Elevator   Home Layout: One level Home Equipment: None Additional Comments: Reports she could go stay with other family if needed.     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    PT Goals (current goals can now be found in the care plan section) Acute Rehab PT Goals Patient Stated Goal: to address visual deficits  PT Goal Formulation: With  patient Time For Goal Achievement: 09/05/17 Potential to Achieve Goals: Good Progress towards PT goals: Progressing toward goals    Frequency    Min 3X/week      PT Plan Discharge plan needs to be updated    Co-evaluation              AM-PAC PT "6 Clicks" Daily Activity  Outcome Measure  Difficulty turning over in bed (including adjusting bedclothes, sheets and blankets)?: None Difficulty moving from lying on back to sitting on the side of the bed? : None Difficulty sitting down on and standing up from a chair with arms (e.g., wheelchair, bedside commode, etc,.)?: A Little Help needed moving to and from a bed to chair (including a wheelchair)?: None Help needed walking in hospital room?: None Help needed climbing 3-5 steps with a railing? : A Little 6 Click Score: 22    End of Session Equipment Utilized During Treatment: Gait belt Activity Tolerance: Patient tolerated treatment well Patient left: with call bell/phone within reach Nurse Communication: Mobility status PT Visit Diagnosis: Unsteadiness on feet (R26.81);Other abnormalities of gait and mobility (R26.89);Other symptoms and signs involving the nervous system (R29.898);Difficulty in walking, not elsewhere classified (R26.2)     Time: 1610-9604 PT Time Calculation (min) (ACUTE ONLY): 12 min  Charges:  $Gait Training: 8-22 mins                    G Codes:       Earney Navy, PTA Pager: [phone]     Darliss Cheney 08/23/2017, 4:13 PM

## 2017-08-23 NOTE — Evaluation (Signed)
Occupational Therapy Evaluation Patient Details Name: Suzanne Nguyen MRN: [id number] DOB: 1996-01-16 Today's Date: 08/23/2017    History of Present Illness Pt is a 22 y/o female admitted secondary to R hand numbness, Ptosis of R eye, diplopia, and multiple falls. CT and MRI negative for acute abnormality, however, CT angio revealed Anterior mediastinal mass, most consistent with thymoma; supports the clinical concern for myasthenia gravis. PMH includes polycystic ovarian syndrome.    Clinical Impression   This 22 y/o F presents with the above. 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 Pt completing room level functional mobility without AD with overall supervision-minguard, requires minguard assist for LB ADLs. Pt presenting with visual deficits including ptosis and reports of dipolopia impacting her overall performance. Will benefit from continued acute OT services and recommend additional OT services after discharge to address pt's visual deficits, as well as to maximize her overall safety and independence with ADLs and mobility.     Follow Up Recommendations  Outpatient OT(outpatient neuro)    Equipment Recommendations  None recommended by OT           Precautions / Restrictions Precautions Precautions: Fall Precaution Comments: Has had multiple falls at home when bending down. Reports feeling like her legs are giving out on her.  Restrictions Weight Bearing Restrictions: No      Mobility Bed Mobility Overal bed mobility: Independent             General bed mobility comments: HOB elevated, no assist needed   Transfers Overall transfer level: Independent   Transfers: Sit to/from Stand Sit to Stand: Supervision         General transfer comment: supervision for safety, stood from EOB and toilet     Balance Overall balance assessment: Needs assistance Sitting-balance support: No upper extremity supported;Feet  supported Sitting balance-Leahy Scale: Good     Standing balance support: Bilateral upper extremity supported;During functional activity Standing balance-Leahy Scale: Fair Standing balance comment: pt completing functional mobility at supervision-minguard level                  Standardized Balance Assessment Standardized Balance Assessment : Dynamic Gait Index   Dynamic Gait Index Level Surface: Normal Change in Gait Speed: Mild Impairment Gait with Horizontal Head Turns: Mild Impairment Gait with Vertical Head Turns: Mild Impairment Gait and Pivot Turn: Normal Step Over Obstacle: Mild Impairment Step Around Obstacles: Normal Steps: Mild Impairment Total Score: 19     ADL either performed or assessed with clinical judgement   ADL Overall ADL's : Needs assistance/impaired Eating/Feeding: Modified independent   Grooming: Standing;Min guard   Upper Body Bathing: Sitting;Min guard   Lower Body Bathing: Min guard;Sit to/from stand   Upper Body Dressing : Min guard;Sitting   Lower Body Dressing: Min guard;Sit to/from stand   Toilet Transfer: Min guard;Ambulation;Regular Museum/gallery exhibitions officer and Hygiene: Min guard;Sit to/from stand Toileting - Clothing Manipulation Details (indicate cue type and reason): pt performing clothing management and peri-care with MinGuard assist      Functional mobility during ADLs: Min guard General ADL Comments: pt completing room level functional mobility, toileting, and standing grooming ADLs with Minguard throughout without AD; pt's mother calling during session with questions regarding POC and pt hospital stay, provided explanation/education regarding reason behind OT/PT consult and therapy's purpose in relation to POC, pt's mother verbalizing preference not to proceed with therapy at this time until clarification obtained regarding Pt's diagnosis, RN notified/MD paged  to answer additional questions PRN; provided pt  with option to complete OT eval (to complete visual assessment as pt with reports of visual deficits) and pt agreeable to complete this aspect of eval; pt reports mostly wanting to address visual deficits at this time      Vision Baseline Vision/History: Wears glasses Wears Glasses: (during computer/phone use ) Patient Visual Report: Diplopia;Other (comment)(ptosis ) Vision Assessment?: Yes Eye Alignment: Impaired (comment)(ptosis of R eye ) Ocular Range of Motion: Restricted on the right Alignment/Gaze Preference: Within Defined Limits Tracking/Visual Pursuits: Decreased smoothness of horizontal tracking;Impaired - to be further tested in functional context(R eye during horizontal tracking) Visual Fields: No apparent deficits Diplopia Assessment: Disappears with one eye closed(as well as when pt is able to maintain R eye completely open )                Pertinent Vitals/Pain Pain Assessment: No/denies pain     Hand Dominance Right   Extremity/Trunk Assessment Upper Extremity Assessment Upper Extremity Assessment: RUE deficits/detail;LUE deficits/detail RUE Deficits / Details: pt reports recently having difficulty grasping items, though reports this has improved; denies numbness/tingling; grip strength equal in R/L UE and finger opposition appears WFL LUE Deficits / Details: pt reports recently having difficulty grasping items, though reports this has improved; denies numbness/tingling; grip strength equal in R/L UE and finger opposition appears [facility]   Lower Extremity Assessment Lower Extremity Assessment: Defer to PT evaluation   Cervical / Trunk Assessment Cervical / Trunk Assessment: Normal   Communication Communication Communication: No difficulties   Cognition Arousal/Alertness: Awake/alert Behavior During Therapy: WFL for tasks assessed/performed Overall Cognitive Status: Within Functional Limits for tasks assessed                                                       Home Living Family/patient expects to be discharged to:: Private residence Living Arrangements: Non-relatives/Friends Available Help at Discharge: Friend(s);Available PRN/intermittently;Family Type of Home: Other(Comment)(dorm at [employer]) Home Access: Elevator     Home Layout: One level     Bathroom Shower/Tub: Occupational psychologist: Standard     Home Equipment: None   Additional Comments: Reports she could go stay with other family if needed.       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         OT Problem List: Decreased strength;Impaired vision/perception      OT Treatment/Interventions: Therapeutic activities;Balance training;Visual/perceptual remediation/compensation    OT Goals(Current goals can be found in the care plan section) Acute Rehab OT Goals Patient Stated Goal: to address visual deficits  OT Goal Formulation: With patient Time For Goal Achievement: 09/06/17 Potential to Achieve Goals: Good  OT Frequency: Min 2X/week                             AM-PAC PT "6 Clicks" Daily Activity     Outcome Measure Help from another person eating meals?: None Help from another person taking care of personal grooming?: None Help from another person toileting, which includes using toliet, bedpan, or urinal?: A Little Help from another person bathing (including washing, rinsing, drying)?: A Little Help from another person to put on and taking off regular upper  body clothing?: None Help from another person to put on and taking off regular lower body clothing?: A Little 6 Click Score: 21   End of Session Nurse Communication: Mobility status;Other (comment)(pt mother with questions)  Activity Tolerance: Patient tolerated treatment well Patient left: Other (comment);with call bell/phone within reach(sitting EOB )  OT Visit Diagnosis: Other symptoms  and signs involving the nervous system (R29.898)                Time: 1054(and 1430-1439 (visual assessment))-1112 OT Time Calculation (min): 18 min Charges:  OT General Charges $OT Visit: 1 Visit OT Evaluation $OT Eval Moderate Complexity: 1 Mod G-Codes:     Lou Cal, OT Pager [phone] 08/23/2017   Raymondo Band 08/23/2017, 4:35 PM

## 2017-08-23 NOTE — Progress Notes (Addendum)
Inpatient Rehabilitation  Per PT request, patient was screened by Gunnar Fusi for appropriateness for an Inpatient Acute Rehab consult.  At this time we are recommending an Inpatient Rehab consult.  Text paged MD to notify; please order if you are agreeable.    Addendum: Discussed case with attending MD and will hold on consult at this time to better determine therapy needs after new treatment is initiated.    Carmelia Roller., CCC/SLP Admission Coordinator  [place]  Cell [phone]

## 2017-08-23 NOTE — Progress Notes (Addendum)
PROGRESS NOTE    Suzanne Nguyen  NIO:[id number] DOB: Jan 03, 1996 DOA: 08/21/2017 PCP: Patient, No Pcp Per   Outpatient Specialists: Dr. Posey Pronto (opthamologist)     Brief Narrative:  Suzanne Nguyen is a 22 y.o. 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.  She has past medical history significant for PCOs, who presented to the ED with complaints of droopiness of the right eye for 5 days duration that has progressively worsened, also with reported double vision.  Also, has had dizziness of 4 days duration with subsequent falls twice over the past 4 days.  Patient also reports a headache over the past 2 days that has improved. Patient went to see an ophthalmologist  for droopiness of the right eye and was subsequently sent to the ED for brain imaging. 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  Labs were sent for work up of suspected myasthenia gravis but will not result until Monday.  Spoke with neurology and plan is to do trial of mestinon and watch for improvement.       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  If not, will need further work up -currently not safe to go home as falling and unable to get around.  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 for improvement  Obesity BMI: 37.44    DVT prophylaxis:  Lovenox   Code Status: Full Code   Family Communication: Spoke with mom on phone 3/20  Disposition Plan:     Consultants:  neuro  Subjective: Had some dizziness when she awoke this  AM  Objective: Vitals:   08/22/17 0700 08/22/17 1100 08/22/17 1950 08/23/17 0457  BP: 125/83 114/76 130/74 (!) 104/50  Pulse: 95 95 70 73  Resp: 16 16 16 16   Temp:  98.2 F (36.8 C) 98 F (36.7 C) 98.4 F (36.9 C)  TempSrc:  Oral Oral Oral  SpO2: 98% 100% 100% 100%   No intake or output data in the 24 hours ending 08/23/17 0902 There were no vitals filed for this visit.  Examination:  General exam: on side of bed, watching you tube vidoes  Respiratory system: no wheezing, no increased work of breathing Cardiovascular system: rrr Gastrointestinal system: +BS, soft Central nervous system: Alert Extremities: moves all 4 ext Skin: No rashes, lesions or ulcers Psychiatry: Judgement and insight appear normal. Mood & affect appropriate.     Data Reviewed: I have personally reviewed following labs and imaging studies  CBC: Recent Labs  Lab 08/21/17 1556  WBC 8.2  HGB 13.0  HCT 40.2  MCV 85.2  PLT 818   Basic Metabolic Panel: Recent Labs  Lab 08/21/17 1556  NA 140  K 4.0  CL 103  CO2 28  GLUCOSE 105*  BUN <5*  CREATININE 0.59  CALCIUM 9.7   GFR: Estimated Creatinine Clearance: 131.7 mL/min (by C-G formula based on SCr of 0.59 mg/dL). Liver Function Tests: No results for input(s): AST, ALT, ALKPHOS, BILITOT, PROT, ALBUMIN in the last 168 hours. No results for input(s): LIPASE, AMYLASE in the last 168 hours. No  results for input(s): AMMONIA in the last 168 hours. Coagulation Profile: No results for input(s): INR, PROTIME in the last 168 hours. Cardiac Enzymes: No results for input(s): CKTOTAL, CKMB, CKMBINDEX, TROPONINI in the last 168 hours. BNP (last 3 results) No results for input(s): PROBNP in the last 8760 hours. HbA1C: No results for input(s): HGBA1C in the last 72 hours. CBG: No results for input(s): GLUCAP in the last 168 hours. Lipid Profile: No results for input(s): CHOL, HDL, LDLCALC, TRIG, CHOLHDL, LDLDIRECT in the last 72 hours. Thyroid  Function Tests: Recent Labs    08/22/17 1126  TSH 0.980   Anemia Panel: No results for input(s): VITAMINB12, FOLATE, FERRITIN, TIBC, IRON, RETICCTPCT in the last 72 hours. Urine analysis:    Component Value Date/Time   COLORURINE STRAW (A) 08/21/2017 1800   APPEARANCEUR CLEAR 08/21/2017 1800   LABSPEC 1.005 08/21/2017 1800   PHURINE 6.0 08/21/2017 1800   GLUCOSEU NEGATIVE 08/21/2017 1800   HGBUR NEGATIVE 08/21/2017 1800   BILIRUBINUR NEGATIVE 08/21/2017 1800   KETONESUR NEGATIVE 08/21/2017 1800   PROTEINUR NEGATIVE 08/21/2017 1800   NITRITE NEGATIVE 08/21/2017 1800   LEUKOCYTESUR NEGATIVE 08/21/2017 1800     )No results found for this or any previous visit (from the past 240 hour(s)).    Anti-infectives (From admission, onward)   None       Radiology Studies: Ct Angio Head W Or Wo Contrast  Result Date: 08/22/2017 CLINICAL DATA:  Right eye ptosis and disc conjugate gaze. Right facial weakness. History of migraines. EXAM: CT ANGIOGRAPHY HEAD AND NECK TECHNIQUE: Multidetector CT imaging of the head and neck was performed using the standard protocol during bolus administration of intravenous contrast. Multiplanar CT image reconstructions and MIPs were obtained to evaluate the vascular anatomy. Carotid stenosis measurements (when applicable) are obtained utilizing NASCET criteria, using the distal internal carotid diameter as the denominator. CONTRAST:  44mL ISOVUE-370 IOPAMIDOL (ISOVUE-370) INJECTION 76% COMPARISON:  Brain MRI 08/22/2017 FINDINGS: CTA NECK FINDINGS Aortic arch: There is no calcific atherosclerosis of the aortic arch. There is no aneurysm, dissection or hemodynamically significant stenosis of the visualized ascending aorta and aortic arch. Conventional 3 vessel aortic branching pattern. The visualized proximal subclavian arteries are widely patent. Right carotid system: --Common carotid artery: Widely patent origin without common carotid artery dissection or  aneurysm. --Internal carotid artery: No dissection, occlusion or aneurysm. No hemodynamically significant stenosis. --External carotid artery: No acute abnormality. Left carotid system: --Common carotid artery: Widely patent origin without common carotid artery dissection or aneurysm. --Internal carotid artery:No dissection, occlusion or aneurysm. No hemodynamically significant stenosis. --External carotid artery: No acute abnormality. Vertebral arteries: Left dominant configuration. Both origins are normal. No dissection, occlusion or flow-limiting stenosis to the vertebrobasilar confluence. Skeleton: There is no bony spinal canal stenosis. No lytic or blastic lesion. Other neck: The nasopharynx is clear. The oropharynx and hypopharynx are normal. The epiglottis is normal. The supraglottic larynx, glottis and subglottic larynx are normal. No retropharyngeal collection. The parapharyngeal spaces are preserved. The parotid and submandibular glands are normal. No sialolithiasis or salivary ductal dilatation. The thyroid gland is normal. There is no cervical lymphadenopathy. Upper chest: There is a homogeneous predominantly hypoattenuating anterior mediastinal mass that measures 9.9 x 5.0 cm. This is incompletely visualized, as it is at the bottom of the field of view. CTA HEAD FINDINGS Anterior circulation: --Intracranial internal carotid arteries: Normal. --Anterior cerebral arteries: Normal. --Middle cerebral arteries: Normal. --Posterior communicating arteries: Present bilaterally. Posterior circulation: --Basilar artery: Normal. --Posterior cerebral arteries: Normal. --Superior  cerebellar arteries: Normal. --Inferior cerebellar arteries: Normal anterior and posterior inferior cerebellar arteries. Venous sinuses: As permitted by contrast timing, patent. Anatomic variants: None Delayed phase: No parenchymal contrast enhancement. Review of the MIP images confirms the above findings. IMPRESSION: 1. Anterior  mediastinal mass, most consistent with thymoma. This supports the clinical concern for myasthenia gravis. 2. No arterial abnormality of the head or neck. 3. No intracranial abnormality. Electronically Signed   By: Ulyses Jarred M.D.   On: 08/22/2017 14:00   Ct Head Wo Contrast  Result Date: 08/22/2017 CLINICAL DATA:  Drooping right eyelid and double vision since Sunday. EXAM: CT HEAD WITHOUT CONTRAST TECHNIQUE: Contiguous axial images were obtained from the base of the skull through the vertex without intravenous contrast. COMPARISON:  None. FINDINGS: BRAIN: The ventricles and sulci are normal. No intraparenchymal hemorrhage, mass effect nor midline shift. No acute large vascular territory infarcts. Grey-white matter distinction is maintained. The basal ganglia are unremarkable. No abnormal extra-axial fluid collections. Basal cisterns are not effaced and midline. The brainstem and cerebellar hemispheres are without acute abnormalities. Anatomic variant partially empty pituitary sella. VASCULAR: Unremarkable. SKULL/SOFT TISSUES: No skull fracture. No significant soft tissue swelling. ORBITS/SINUSES: The included ocular globes and orbital contents are normal.The mastoid air cells are clear. The included paranasal sinuses are well-aerated. OTHER: None. IMPRESSION: Anatomic variant partially empty pituitary sella. No acute intracranial abnormality. Electronically Signed   By: Ashley Royalty M.D.   On: 08/22/2017 01:21   Ct Angio Neck W Or Wo Contrast  Result Date: 08/22/2017 CLINICAL DATA:  Right eye ptosis and disc conjugate gaze. Right facial weakness. History of migraines. EXAM: CT ANGIOGRAPHY HEAD AND NECK TECHNIQUE: Multidetector CT imaging of the head and neck was performed using the standard protocol during bolus administration of intravenous contrast. Multiplanar CT image reconstructions and MIPs were obtained to evaluate the vascular anatomy. Carotid stenosis measurements (when applicable) are obtained  utilizing NASCET criteria, using the distal internal carotid diameter as the denominator. CONTRAST:  29mL ISOVUE-370 IOPAMIDOL (ISOVUE-370) INJECTION 76% COMPARISON:  Brain MRI 08/22/2017 FINDINGS: CTA NECK FINDINGS Aortic arch: There is no calcific atherosclerosis of the aortic arch. There is no aneurysm, dissection or hemodynamically significant stenosis of the visualized ascending aorta and aortic arch. Conventional 3 vessel aortic branching pattern. The visualized proximal subclavian arteries are widely patent. Right carotid system: --Common carotid artery: Widely patent origin without common carotid artery dissection or aneurysm. --Internal carotid artery: No dissection, occlusion or aneurysm. No hemodynamically significant stenosis. --External carotid artery: No acute abnormality. Left carotid system: --Common carotid artery: Widely patent origin without common carotid artery dissection or aneurysm. --Internal carotid artery:No dissection, occlusion or aneurysm. No hemodynamically significant stenosis. --External carotid artery: No acute abnormality. Vertebral arteries: Left dominant configuration. Both origins are normal. No dissection, occlusion or flow-limiting stenosis to the vertebrobasilar confluence. Skeleton: There is no bony spinal canal stenosis. No lytic or blastic lesion. Other neck: The nasopharynx is clear. The oropharynx and hypopharynx are normal. The epiglottis is normal. The supraglottic larynx, glottis and subglottic larynx are normal. No retropharyngeal collection. The parapharyngeal spaces are preserved. The parotid and submandibular glands are normal. No sialolithiasis or salivary ductal dilatation. The thyroid gland is normal. There is no cervical lymphadenopathy. Upper chest: There is a homogeneous predominantly hypoattenuating anterior mediastinal mass that measures 9.9 x 5.0 cm. This is incompletely visualized, as it is at the bottom of the field of view. CTA HEAD FINDINGS Anterior  circulation: --Intracranial internal carotid arteries: Normal. --Anterior cerebral arteries: Normal. --  Middle cerebral arteries: Normal. --Posterior communicating arteries: Present bilaterally. Posterior circulation: --Basilar artery: Normal. --Posterior cerebral arteries: Normal. --Superior cerebellar arteries: Normal. --Inferior cerebellar arteries: Normal anterior and posterior inferior cerebellar arteries. Venous sinuses: As permitted by contrast timing, patent. Anatomic variants: None Delayed phase: No parenchymal contrast enhancement. Review of the MIP images confirms the above findings. IMPRESSION: 1. Anterior mediastinal mass, most consistent with thymoma. This supports the clinical concern for myasthenia gravis. 2. No arterial abnormality of the head or neck. 3. No intracranial abnormality. Electronically Signed   By: Ulyses Jarred M.D.   On: 08/22/2017 14:00   Mr Jeri Cos ZE Contrast  Result Date: 08/22/2017 CLINICAL DATA:  Initial evaluation for acute dizziness, diplopia, right eyelid lacking, headache. EXAM: MRI HEAD WITHOUT AND WITH CONTRAST TECHNIQUE: Multiplanar, multiecho pulse sequences of the brain and surrounding structures were obtained without and with intravenous contrast. CONTRAST:  63mL MULTIHANCE GADOBENATE DIMEGLUMINE 529 MG/ML IV SOLN COMPARISON:  Prior CT from earlier the same day. FINDINGS: Brain: Cerebral volume within normal limits. No focal parenchymal signal abnormality. No abnormal foci of restricted diffusion to suggest acute or subacute ischemia. Gray-white matter differentiation maintained. No encephalomalacia to suggest chronic infarction. No mass lesion, midline shift or mass effect. No hydrocephalus. No extra-axial fluid collection. Major dural sinuses grossly patent. No abnormal enhancement. Incidental note made of a partially empty sella. Suprasellar region normal. Midline structures intact. Vascular: Major intracranial vascular flow voids are maintained and are normal in  appearance. Skull and upper cervical spine: Craniocervical junction within normal limits. Upper cervical spine unremarkable. Bone marrow signal intensity within normal limits. No scalp soft tissue abnormality. Sinuses/Orbits: Globes and orbital soft tissues within normal limits. Paranasal sinuses are clear. No mastoid effusion. Inner ear structures normal. Other: None. IMPRESSION: 1. Negative brain MRI. No acute intracranial abnormality identified. 2. Partially empty sella. Electronically Signed   By: Jeannine Boga M.D.   On: 08/22/2017 06:00        Scheduled Meds: . enoxaparin (LOVENOX) injection  40 mg Subcutaneous Q24H   Continuous Infusions:   LOS: 0 days    Time spent: 35 min    Geradine Girt, DO [facility] Pager [phone]  If 7PM-7AM, please contact night-coverage [email or web address] Password [email or web address] 08/23/2017, 9:02 AM

## 2017-08-23 NOTE — Progress Notes (Addendum)
Neurology Progress Note   S:// No acute changes.   O:// Current vital signs: BP (!) 104/50 (BP Location: Right Arm)   Pulse 73   Temp 98.4 F (36.9 C) (Oral)   Resp 16   SpO2 100%  Vital signs in last 24 hours: Temp:  [98 F (36.7 C)-98.4 F (36.9 C)] 98.4 F (36.9 C) (03/21 0457) Pulse Rate:  [70-95] 73 (03/21 0457) Resp:  [16] 16 (03/21 0457) BP: (104-130)/(50-76) 104/50 (03/21 0457) SpO2:  [100 %] 100 % (03/21 0457) Unchanged exam  GENERAL: Awake, alert in NAD HEENT: - Normocephalic and atraumatic, dry mm, no LN++, Prominent thyroid LUNGS - Breathing comfortably on RA ABDOMEN - Soft, nontender, nondistended with normoactive BS Ext: warm, well perfused, intact peripheral pulses, no edema  NEURO:  Mental Status: AA&Ox3  Language: speech is fluent.  Naming, repetition, and comprehension intact. Cranial Nerves: Right ptosis. PERRL. Dysconjugate gaze with inability to Adduct the left eye, visual fields full, no facial asymmetry, facial sensation intact, hearing intact, tongue/uvula/soft palate midline, normal sternocleidomastoid and trapezius muscle strength. No evidence of tongue atrophy or fibrillations Motor: 5/5 upper and lower extremity strength bilaterally  Tone: is normal and bulk is normal Sensation- Intact to light touch bilaterally Coordination: FTN intact bilaterally, no ataxia in BLE. Gait- normal   Medications  Current Facility-Administered Medications:  .  albuterol (PROVENTIL) (2.5 MG/3ML) 0.083% nebulizer solution 2.5 mg, 2.5 mg, Nebulization, Q6H PRN, Vann, Jessica U, DO .  enoxaparin (LOVENOX) injection 40 mg, 40 mg, Subcutaneous, Q24H, Greta Doom, MD Labs CBC    Component Value Date/Time   WBC 8.2 08/21/2017 1556   RBC 4.72 08/21/2017 1556   HGB 13.0 08/21/2017 1556   HCT 40.2 08/21/2017 1556   PLT 284 08/21/2017 1556   MCV 85.2 08/21/2017 1556   MCH 27.5 08/21/2017 1556   MCHC 32.3 08/21/2017 1556   RDW 13.1 08/21/2017 1556     CMP     Component Value Date/Time   NA 140 08/21/2017 1556   K 4.0 08/21/2017 1556   CL 103 08/21/2017 1556   CO2 28 08/21/2017 1556   GLUCOSE 105 (H) 08/21/2017 1556   BUN <5 (L) 08/21/2017 1556   CREATININE 0.59 08/21/2017 1556   CALCIUM 9.7 08/21/2017 1556   PROT 7.3 02/04/2016 2306   ALBUMIN 3.6 02/04/2016 2306   AST 21 02/04/2016 2306   ALT 13 (L) 02/04/2016 2306   ALKPHOS 65 02/04/2016 2306   BILITOT 0.2 (L) 02/04/2016 2306   GFRNONAA >60 08/21/2017 1556   GFRAA >60 08/21/2017 1556   Imaging I have reviewed images in epic and the results pertinent to this consultation are:  CT-scan of the brain IMPRESSION: Anatomic variant partially empty pituitary sella. No acute intracranial abnormality.  MRI examination of the brain IMPRESSION: 1. Negative brain MRI. No acute intracranial abnormality identified. 2. Partially empty sella.  CTA Head & Neck IMPRESSION: 1. Anterior mediastinal mass, most consistent with thymoma. This supports the clinical concern for myasthenia gravis. 2. No arterial abnormality of the head or neck. 3. No intracranial abnormality.  Assessment: 22 yo F presenting with right ptosis, diplopia, right facial weakness, and increased frequency of her migraines x 2 weeks. On further questioning, patient reported that her symptoms and blurry vision get worse as the day progresses. Especially her ptosis and diplopia tend to get worse with activity and time. 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 Work up for myasthenia gravis  pending. Ab results wont be available till Monday.  Impression: Suspect myasthenia gravis   Recommendations: -- Will start trial of mestinon - 30 mg q8h x 3 doses total. Will reassess after that. -- She will need to f/u with Dr. Posey Pronto at [facility] on discharge. -- F/u acetylcholine receptor abs -- ANA, SSA, SSB ab pending as well. HIV non reactive, ACE  within normal limits  Velna Ochs, M.D. - PGY2 Pager: [phone] 08/23/2017, 9:33 AM   Attending Neurohospitalist Addendum Patient seen and examined with APP/Resident. Agree with the history and physical as documented above. Agree with the plan as documented, which I helped formulate. I have independently reviewed the chart, obtained history, review of systems and examined the patient.I have personally reviewed pertinent head/neck/spine imaging (CT/MRI).  No aneurysm or dissection on CTA. MRI-no evidence of demyelination or brainstem lesion Possibly thymoma.  I have edited the note above in addition to my addendum below.  Spoke to mother on phone in detail for over 20 min. Explained differentials and plan for mestinon trial. Also told her about thymoma and possible surgical option. She for now is very against concept of mediastinal surgery. Will need to f/u as outpatient. Will also attempt bedside icepack test to assess response. If she has positiv response to mestinon, will discharhe her on mestinon as well as on low dose Prednisone. Will follow.  Please feel free to call with any questions. --- Amie Portland, MD [facility] Pager: [phone]  If 7pm to 7am, please call on call as listed on AMION.

## 2017-08-24 ENCOUNTER — Encounter: Payer: Self-pay | Admitting: Neurology

## 2017-08-24 DIAGNOSIS — E282 Polycystic ovarian syndrome: Secondary | ICD-10-CM | POA: Diagnosis not present

## 2017-08-24 DIAGNOSIS — R29898 Other symptoms and signs involving the musculoskeletal system: Secondary | ICD-10-CM | POA: Diagnosis not present

## 2017-08-24 DIAGNOSIS — H532 Diplopia: Secondary | ICD-10-CM | POA: Diagnosis not present

## 2017-08-24 DIAGNOSIS — H02401 Unspecified ptosis of right eyelid: Secondary | ICD-10-CM | POA: Diagnosis not present

## 2017-08-24 DIAGNOSIS — E669 Obesity, unspecified: Secondary | ICD-10-CM | POA: Diagnosis not present

## 2017-08-24 MED ORDER — PYRIDOSTIGMINE BROMIDE 60 MG PO TABS: 30.0000 mg | ORAL_TABLET | Freq: Three times a day (TID) | ORAL | 0 refills | Status: DC

## 2017-08-24 MED ORDER — PREDNISONE 10 MG PO TABS: 10.0000 mg | ORAL_TABLET | Freq: Every day | ORAL | 0 refills | Status: DC

## 2017-08-24 MED ORDER — PREDNISONE 10 MG PO TABS
10.0000 mg | ORAL_TABLET | Freq: Every day | ORAL | Status: DC
  Filled 2017-08-24: qty 1

## 2017-08-24 NOTE — Progress Notes (Signed)
Occupational Therapy Treatment Patient Details Name: Suzanne Nguyen MRN: [id number] DOB: 12-29-1995 Today's Date: 08/24/2017    History of present illness Pt is a 22 y/o female admitted secondary to R hand numbness, Ptosis of R eye, diplopia, and multiple falls. CT and MRI negative for acute abnormality, however, CT angio revealed Anterior mediastinal mass, most consistent with thymoma; supports the clinical concern for myasthenia gravis. PMH includes polycystic ovarian syndrome.    OT comments  Pt reports feeling better today and being more independent compared to yesterday. Pt reports "I can feel my fingers and I dont have the numbness"  Follow Up Recommendations  Outpatient OT    Equipment Recommendations  None recommended by OT    Recommendations for Other Services      Precautions / Restrictions Precautions Precautions: Fall Precaution Comments: Has had multiple falls at home when bending down. Reports feeling like her legs are giving out on her.        Mobility Bed Mobility Overal bed mobility: Independent                Transfers Overall transfer level: Modified independent                    Balance                                           ADL either performed or assessed with clinical judgement   ADL Overall ADL's : Needs assistance/impaired     Grooming: Wash/dry hands;Standing;Modified independent                                 General ADL Comments: Pt notes seeing double in R lower quadrant this session. pt with glasses on computer task for homework. pt able to use tape on R lens to fix diplopia. pt reports no diplopia at this time. pt educated on taping, provided tape and provided a gold pair of glasses for class. pt educated on study strategies because she self reports feeling stressed about two pending test. pt advised to use classmates to have them read to her and verbalize for studying. Pt has mom at  bedside and advised to let mom discuss informaotion to limit TV and computer screen time. Pt advised to take time to actually close eyes and sit and rest them.      Vision   Additional Comments: see above glasses taping   Perception     Praxis      Cognition Arousal/Alertness: Awake/alert Behavior During Therapy: WFL for tasks assessed/performed Overall Cognitive Status: Within Functional Limits for tasks assessed                                          Exercises     Shoulder Instructions       General Comments      Pertinent Vitals/ Pain       Pain Assessment: No/denies pain  Home Living                                          Prior Functioning/Environment  Frequency  Min 2X/week        Progress Toward Goals  OT Goals(current goals can now be found in the care plan section)  Progress towards OT goals: Progressing toward goals  Acute Rehab OT Goals Patient Stated Goal: to address visual deficits  OT Goal Formulation: With patient Time For Goal Achievement: 09/06/17 Potential to Achieve Goals: Good ADL Goals Pt Will Perform Grooming: with modified independence;standing Pt Will Perform Lower Body Bathing: with modified independence;sit to/from stand Pt Will Perform Lower Body Dressing: with modified independence;sit to/from stand  Plan Discharge plan remains appropriate    Co-evaluation                 AM-PAC PT "6 Clicks" Daily Activity     Outcome Measure   Help from another person eating meals?: None Help from another person taking care of personal grooming?: None Help from another person toileting, which includes using toliet, bedpan, or urinal?: A Little Help from another person bathing (including washing, rinsing, drying)?: A Little Help from another person to put on and taking off regular upper body clothing?: None Help from another person to put on and taking off regular lower body  clothing?: A Little 6 Click Score: 21    End of Session Equipment Utilized During Treatment: Gait belt  OT Visit Diagnosis: Other symptoms and signs involving the nervous system (R29.898)   Activity Tolerance Patient tolerated treatment well   Patient Left in bed;with call bell/phone within reach;with family/visitor present   Nurse Communication Mobility status;Precautions        Time: 1200(1200)-1221 OT Time Calculation (min): 21 min  Charges: OT General Charges $OT Visit: 1 Visit OT Treatments $Self Care/Home Management : 8-22 mins   Jeri Modena   OTR/L Pager: [phone] Office: [phone] .    Parke Poisson B 08/24/2017, 3:28 PM

## 2017-08-24 NOTE — Progress Notes (Addendum)
Neurology Progress Note   S:// Patient reports improvement in her ptosis and diplopia with initiation of mestinon. Mother at bedside. + Ice pack test yesterday evening.   O:// Current vital signs: BP 105/60 (BP Location: Right Arm)   Pulse 82   Temp 97.8 F (36.6 C) (Oral)   Resp 16   SpO2 100%  Vital signs in last 24 hours: Temp:  [97.5 F (36.4 C)-98.1 F (36.7 C)] 97.8 F (36.6 C) (03/22 0400) Pulse Rate:  [66-82] 82 (03/22 0400) Resp:  [16-18] 16 (03/22 0400) BP: (105-134)/(60-80) 105/60 (03/22 0400) SpO2:  [100 %] 100 % (03/22 0400)   GENERAL: Awake, alert in NAD HEENT: - Normocephalic and atraumatic, dry mm, no LN++ LUNGS -Breathing comfortably on RA ABDOMEN - Soft, nontender, nondistended with normoactive BS Ext: warm, well perfused, intact peripheral pulses,no edema  NEURO:  Mental Status: AA&Ox3  Language: speech isfluent.Naming, repetition, and comprehension intact. Cranial Nerves:Mild right ptosis, improved from yesterday.PERRL.Dysconjugate gaze with inability to adduct the left eye has improved, visual fields full, no facial asymmetry, facial sensation intact, hearing intact, tongue/uvula/soft palate midline, normal sternocleidomastoid and trapezius muscle strength. No evidence of tongue atrophy or fibrillations Motor:5/5 upper and lower extremity strength bilaterally Tone: is normal and bulk is normal Sensation- Intact to light touch bilaterally Coordination: FTN intact bilaterally, no ataxia in BLE. Gait-normal   Medications  Current Facility-Administered Medications:  .  albuterol (PROVENTIL) (2.5 MG/3ML) 0.083% nebulizer solution 2.5 mg, 2.5 mg, Nebulization, Q6H PRN, Vann, Jessica U, DO .  enoxaparin (LOVENOX) injection 40 mg, 40 mg, Subcutaneous, Q24H, Greta Doom, MD, 40 mg at 08/24/17 1030 .  [START ON 08/25/2017] predniSONE (DELTASONE) tablet 10 mg, 10 mg, Oral, Q breakfast, Guilloud, Hoyle Sauer, MD .  pyridostigmine (MESTINON)  tablet 30 mg, 30 mg, Oral, Q8H, Amie Portland, MD, 30 mg at 08/24/17 0641 Labs CBC    Component Value Date/Time   WBC 8.2 08/21/2017 1556   RBC 4.72 08/21/2017 1556   HGB 13.0 08/21/2017 1556   HCT 40.2 08/21/2017 1556   PLT 284 08/21/2017 1556   MCV 85.2 08/21/2017 1556   MCH 27.5 08/21/2017 1556   MCHC 32.3 08/21/2017 1556   RDW 13.1 08/21/2017 1556    CMP     Component Value Date/Time   NA 140 08/21/2017 1556   K 4.0 08/21/2017 1556   CL 103 08/21/2017 1556   CO2 28 08/21/2017 1556   GLUCOSE 105 (H) 08/21/2017 1556   BUN <5 (L) 08/21/2017 1556   CREATININE 0.59 08/21/2017 1556   CALCIUM 9.7 08/21/2017 1556   PROT 7.3 02/04/2016 2306   ALBUMIN 3.6 02/04/2016 2306   AST 21 02/04/2016 2306   ALT 13 (L) 02/04/2016 2306   ALKPHOS 65 02/04/2016 2306   BILITOT 0.2 (L) 02/04/2016 2306   GFRNONAA >60 08/21/2017 1556   GFRAA >60 08/21/2017 1556     Imaging I have reviewed images in epic and the results pertinent to this consultation are:  CT-scan of the brain IMPRESSION: Anatomic variant partially empty pituitary sella. No acute intracranial abnormality.  MRI examination of the brain IMPRESSION: 1. Negative brain MRI. No acute intracranial abnormality identified. 2. Partially empty sella.  CTA Head & Neck IMPRESSION: 1. Anterior mediastinal mass, most consistent with thymoma. This supports the clinical concern for myasthenia gravis. 2. No arterial abnormality of the head or neck. 3. No intracranial abnormality.  Assessment: 22 yo F presenting with right ptosis, diplopia, right facial weakness, and increased frequency of her migraines x  2 weeks. On further questioning, patient reported that her symptoms and blurry vision get worse as the day progresses. Especially her ptosis and diplopia tend to get worse with activity and time. 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 Work up for myasthenia gravis  pending. Ab results wont be available until next week. Mestinon therapy started 2/21 with immediate improvement in patient's symptoms. Also with + ice pack test.   Impression: Suspect myasthenia gravis   Recommendations: --Continue mestinon 30 mg qh8 --Start prednisone 10 mg daily indefinitely until follow up with Dr. Posey Pronto at [facility]. Referral placed. Pt provided with practice phone number. --F/u acetylcholine receptor abs --pt and mother reluctant for thymectomy but will probably see surgery if outpatient neurologist also agrees --Other work up ordered on admission has remained negative / non reactive (ANA, SSA, SSB ab, HIV, and ACE level within normal limits) --Ophtho follow up for possible corrective lenses to aid resolution of diplopia as symptoms of diplopia are a hinderance to perform school work. She will return to her outpatient ophthalmologist --She has requested note for school absence. Primary team to provide.   Discussed with Dr. Wynelle Cleveland in person.  Please call neurology with questions. We will be available as needed.  Velna Ochs, M.D. - PGY2 08/24/2017, 11:10 AM   Attending Neurohospitalist Addendum Patient seen and examined with Dr. Philipp Ovens, medical resident. Agree with the history and physical as documented above. Agree with the plan as documented, which I helped formulate and I have edited the note.  I have independently reviewed the chart, obtained history, review of systems and examined the patient.I have personally reviewed pertinent head/neck/spine imaging (CT/MRI). Please feel free to call with any questions.  --- Amie Portland, MD [facility] Pager: [phone] If 7pm to 7am, please call on call as listed on AMION.

## 2017-08-24 NOTE — Discharge Summary (Signed)
Physician Discharge Summary  Suzanne Nguyen DGU:[id number] DOB: 1995-07-21 DOA: 08/21/2017  PCP: Patient, No Pcp Per  Admit date: 08/21/2017 Discharge date: 08/24/2017  Admitted From: home  Disposition: home  Recommendations for Outpatient Follow-up:  - Acetylcholine receptor antibodies pending  Discharge Condition:  stable   CODE STATUS:  Full code Consultations:  neuro    Discharge Diagnoses:  Principal Problem:   Ptosis- right eye Active Problems:   Polycystic ovarian syndrome   Diplopia   Obesity (BMI 35.0-39.9 without comorbidity)     Brief Summary: 22 y/o female with h/o PCOS who presents to the ER for right eye drooping, diplopia, headache and unsteady gait causing falls. Initial suspicion was Myasthenia gravis.  CT head without contrast:  Anatomic variant partially empty pituitary sella  MRI recommended by neurologist.  MRI:   No acute intracranial abnormality identified.   Partially empty sella.  CTA head and neck subsequently done:  1. Anterior mediastinal mass, most consistent with thymoma. This supports the clinical concern for myasthenia gravis. 2. No arterial abnormality of the head or neck. 3. No intracranial abnormality.   Hospital Course:  Right eye ptosis, diplopia, difficult ambulating with unsteady gait Anterior mediastinal mass- possibly Thymoma - neuro recommended trial of mestinon (started yesterday) which has helped her symptoms improve (not yet resolved) -  Dr Rory Percy recommends to continue Mestinon 30 mg Q8 hrs and Prednisone 10 mg daily until she follows up with Dr Posey Pronto- he has send an ambulatory referral for appt - Acetylcholine receptor antibodies pending - outpt OT and PT recommended which has been arranged     Ref. Range 02/04/2016 23:06 08/21/2017 15:56 08/21/2017 18:00 08/21/2017 18:05 08/22/2017 01:19 08/22/2017 02:05 08/22/2017 02:55 08/22/2017 11:26  CRP Latest Ref Range: <1.0 mg/dL     3.3 (H)     Sed Rate Latest Ref Range: 0 -  22 mm/hr     28 (H)     TSH Latest Ref Range: 0.350 - 4.500 uIU/mL        0.980  Anit Nuclear Antibody(ANA) Latest Ref Range: Negative      Negative     Angiotensin-Converting Enzyme Latest Ref Range: 14 - 82 U/L       62   SSA (Ro) (ENA) Antibody, IgG Latest Ref Range: 0.0 - 0.9 AI       <0.2   SSB (La) (ENA) Antibody, IgG Latest Ref Range: 0.0 - 0.9 AI       <0.2   Strep A Culture Unknown          HIV Screen 4th Generation wRfx Latest Ref Range: Non Reactive       Non Reactive     Discharge Exam: Vitals:   08/23/17 2043 08/24/17 0400  BP: 134/80 105/60  Pulse: 66 82  Resp: 16 16  Temp: 98.1 F (36.7 C) 97.8 F (36.6 C)  SpO2: 100% 100%   Vitals:   08/23/17 0457 08/23/17 1345 08/23/17 2043 08/24/17 0400  BP: (!) 104/50 122/75 134/80 105/60  Pulse: 73 81 66 82  Resp: 16 18 16 16   Temp: 98.4 F (36.9 C) (!) 97.5 F (36.4 C) 98.1 F (36.7 C) 97.8 F (36.6 C)  TempSrc: Oral Oral Oral Oral  SpO2: 100% 100% 100% 100%    General: Pt is alert, awake, not in acute distress Cardiovascular: RRR, S1/S2 +, no rubs, no gallops Respiratory: CTA bilaterally, no wheezing, no rhonchi Abdominal: Soft, NT, ND, bowel sounds + Extremities: no edema, no cyanosis   Discharge Instructions  Discharge Instructions    Ambulatory referral to Neurology   Complete by:  As directed    An appointment is requested in approximately: 2 weeks   Diet - low sodium heart healthy   Complete by:  As directed    Increase activity slowly   Complete by:  As directed      Allergies as of 08/24/2017   No Known Allergies     Medication List    TAKE these medications   ibuprofen 200 MG tablet Commonly known as:  ADVIL,MOTRIN Take 200 mg by mouth every 6 (six) hours as needed for mild pain.   predniSONE 10 MG tablet Commonly known as:  DELTASONE Take 1 tablet (10 mg total) by mouth daily with breakfast. Start taking on:  08/25/2017   pyridostigmine 60 MG tablet Commonly known as:  MESTINON Take  0.5 tablets (30 mg total) by mouth every 8 (eight) hours.      Follow-up Information    Alda Berthold, DO. Schedule an appointment as soon as possible for a visit.   Specialty:  Neurology Contact information: [address] [place] [id number] [phone]        [facility] .   Contact information: [address] [place] [address] [place] Follow up.   Specialty:  Rehabilitation Why:  Someone from Outpatient rehab will contact you to arrange an appointment date and time. If you have not heard from them within 48 business hours please call [phone] Contact information: [place] [phone]         No Known Allergies   Procedures/Studies:    Ct Angio Head W Or Wo Contrast  Result Date: 08/22/2017 CLINICAL DATA:  Right eye ptosis and disc conjugate gaze. Right facial weakness. History of migraines. EXAM: CT ANGIOGRAPHY HEAD AND NECK TECHNIQUE: Multidetector CT imaging of the head and neck was performed using the standard protocol during bolus administration of intravenous contrast. Multiplanar CT image reconstructions and MIPs were obtained to evaluate the vascular anatomy. Carotid stenosis measurements (when applicable) are obtained utilizing NASCET criteria, using the distal internal carotid diameter as the denominator. CONTRAST:  50mL ISOVUE-370 IOPAMIDOL (ISOVUE-370) INJECTION 76% COMPARISON:  Brain MRI 08/22/2017 FINDINGS: CTA NECK FINDINGS Aortic arch: There is no calcific atherosclerosis of the aortic arch. There is no aneurysm, dissection or hemodynamically significant stenosis of the visualized ascending aorta and aortic arch. Conventional 3 vessel aortic branching pattern. The visualized proximal subclavian arteries are widely patent. Right carotid system: --Common  carotid artery: Widely patent origin without common carotid artery dissection or aneurysm. --Internal carotid artery: No dissection, occlusion or aneurysm. No hemodynamically significant stenosis. --External carotid artery: No acute abnormality. Left carotid system: --Common carotid artery: Widely patent origin without common carotid artery dissection or aneurysm. --Internal carotid artery:No dissection, occlusion or aneurysm. No hemodynamically significant stenosis. --External carotid artery: No acute abnormality. Vertebral arteries: Left dominant configuration. Both origins are normal. No dissection, occlusion or flow-limiting stenosis to the vertebrobasilar confluence. Skeleton: There is no bony spinal canal stenosis. No lytic or blastic lesion. Other neck: The nasopharynx is clear. The oropharynx and hypopharynx are normal. The epiglottis is normal. The supraglottic larynx, glottis and subglottic larynx are normal. No retropharyngeal collection. The parapharyngeal spaces are preserved. The parotid and submandibular glands are normal. No sialolithiasis or salivary ductal dilatation. The thyroid gland is normal. There is  no cervical lymphadenopathy. Upper chest: There is a homogeneous predominantly hypoattenuating anterior mediastinal mass that measures 9.9 x 5.0 cm. This is incompletely visualized, as it is at the bottom of the field of view. CTA HEAD FINDINGS Anterior circulation: --Intracranial internal carotid arteries: Normal. --Anterior cerebral arteries: Normal. --Middle cerebral arteries: Normal. --Posterior communicating arteries: Present bilaterally. Posterior circulation: --Basilar artery: Normal. --Posterior cerebral arteries: Normal. --Superior cerebellar arteries: Normal. --Inferior cerebellar arteries: Normal anterior and posterior inferior cerebellar arteries. Venous sinuses: As permitted by contrast timing, patent. Anatomic variants: None Delayed phase: No parenchymal contrast enhancement. Review  of the MIP images confirms the above findings. IMPRESSION: 1. Anterior mediastinal mass, most consistent with thymoma. This supports the clinical concern for myasthenia gravis. 2. No arterial abnormality of the head or neck. 3. No intracranial abnormality. Electronically Signed   By: Ulyses Jarred M.D.   On: 08/22/2017 14:00   Ct Head Wo Contrast  Result Date: 08/22/2017 CLINICAL DATA:  Drooping right eyelid and double vision since Sunday. EXAM: CT HEAD WITHOUT CONTRAST TECHNIQUE: Contiguous axial images were obtained from the base of the skull through the vertex without intravenous contrast. COMPARISON:  None. FINDINGS: BRAIN: The ventricles and sulci are normal. No intraparenchymal hemorrhage, mass effect nor midline shift. No acute large vascular territory infarcts. Grey-white matter distinction is maintained. The basal ganglia are unremarkable. No abnormal extra-axial fluid collections. Basal cisterns are not effaced and midline. The brainstem and cerebellar hemispheres are without acute abnormalities. Anatomic variant partially empty pituitary sella. VASCULAR: Unremarkable. SKULL/SOFT TISSUES: No skull fracture. No significant soft tissue swelling. ORBITS/SINUSES: The included ocular globes and orbital contents are normal.The mastoid air cells are clear. The included paranasal sinuses are well-aerated. OTHER: None. IMPRESSION: Anatomic variant partially empty pituitary sella. No acute intracranial abnormality. Electronically Signed   By: Ashley Royalty M.D.   On: 08/22/2017 01:21   Ct Angio Neck W Or Wo Contrast  Result Date: 08/22/2017 CLINICAL DATA:  Right eye ptosis and disc conjugate gaze. Right facial weakness. History of migraines. EXAM: CT ANGIOGRAPHY HEAD AND NECK TECHNIQUE: Multidetector CT imaging of the head and neck was performed using the standard protocol during bolus administration of intravenous contrast. Multiplanar CT image reconstructions and MIPs were obtained to evaluate the vascular  anatomy. Carotid stenosis measurements (when applicable) are obtained utilizing NASCET criteria, using the distal internal carotid diameter as the denominator. CONTRAST:  92mL ISOVUE-370 IOPAMIDOL (ISOVUE-370) INJECTION 76% COMPARISON:  Brain MRI 08/22/2017 FINDINGS: CTA NECK FINDINGS Aortic arch: There is no calcific atherosclerosis of the aortic arch. There is no aneurysm, dissection or hemodynamically significant stenosis of the visualized ascending aorta and aortic arch. Conventional 3 vessel aortic branching pattern. The visualized proximal subclavian arteries are widely patent. Right carotid system: --Common carotid artery: Widely patent origin without common carotid artery dissection or aneurysm. --Internal carotid artery: No dissection, occlusion or aneurysm. No hemodynamically significant stenosis. --External carotid artery: No acute abnormality. Left carotid system: --Common carotid artery: Widely patent origin without common carotid artery dissection or aneurysm. --Internal carotid artery:No dissection, occlusion or aneurysm. No hemodynamically significant stenosis. --External carotid artery: No acute abnormality. Vertebral arteries: Left dominant configuration. Both origins are normal. No dissection, occlusion or flow-limiting stenosis to the vertebrobasilar confluence. Skeleton: There is no bony spinal canal stenosis. No lytic or blastic lesion. Other neck: The nasopharynx is clear. The oropharynx and hypopharynx are normal. The epiglottis is normal. The supraglottic larynx, glottis and subglottic larynx are normal. No retropharyngeal collection. The parapharyngeal spaces are preserved. The parotid  and submandibular glands are normal. No sialolithiasis or salivary ductal dilatation. The thyroid gland is normal. There is no cervical lymphadenopathy. Upper chest: There is a homogeneous predominantly hypoattenuating anterior mediastinal mass that measures 9.9 x 5.0 cm. This is incompletely visualized, as  it is at the bottom of the field of view. CTA HEAD FINDINGS Anterior circulation: --Intracranial internal carotid arteries: Normal. --Anterior cerebral arteries: Normal. --Middle cerebral arteries: Normal. --Posterior communicating arteries: Present bilaterally. Posterior circulation: --Basilar artery: Normal. --Posterior cerebral arteries: Normal. --Superior cerebellar arteries: Normal. --Inferior cerebellar arteries: Normal anterior and posterior inferior cerebellar arteries. Venous sinuses: As permitted by contrast timing, patent. Anatomic variants: None Delayed phase: No parenchymal contrast enhancement. Review of the MIP images confirms the above findings. IMPRESSION: 1. Anterior mediastinal mass, most consistent with thymoma. This supports the clinical concern for myasthenia gravis. 2. No arterial abnormality of the head or neck. 3. No intracranial abnormality. Electronically Signed   By: Ulyses Jarred M.D.   On: 08/22/2017 14:00   Mr Jeri Cos QM Contrast  Result Date: 08/22/2017 CLINICAL DATA:  Initial evaluation for acute dizziness, diplopia, right eyelid lacking, headache. EXAM: MRI HEAD WITHOUT AND WITH CONTRAST TECHNIQUE: Multiplanar, multiecho pulse sequences of the brain and surrounding structures were obtained without and with intravenous contrast. CONTRAST:  68mL MULTIHANCE GADOBENATE DIMEGLUMINE 529 MG/ML IV SOLN COMPARISON:  Prior CT from earlier the same day. FINDINGS: Brain: Cerebral volume within normal limits. No focal parenchymal signal abnormality. No abnormal foci of restricted diffusion to suggest acute or subacute ischemia. Gray-white matter differentiation maintained. No encephalomalacia to suggest chronic infarction. No mass lesion, midline shift or mass effect. No hydrocephalus. No extra-axial fluid collection. Major dural sinuses grossly patent. No abnormal enhancement. Incidental note made of a partially empty sella. Suprasellar region normal. Midline structures intact. Vascular:  Major intracranial vascular flow voids are maintained and are normal in appearance. Skull and upper cervical spine: Craniocervical junction within normal limits. Upper cervical spine unremarkable. Bone marrow signal intensity within normal limits. No scalp soft tissue abnormality. Sinuses/Orbits: Globes and orbital soft tissues within normal limits. Paranasal sinuses are clear. No mastoid effusion. Inner ear structures normal. Other: None. IMPRESSION: 1. Negative brain MRI. No acute intracranial abnormality identified. 2. Partially empty sella. Electronically Signed   By: Jeannine Boga M.D.   On: 08/22/2017 06:00     The results of significant diagnostics from this hospitalization (including imaging, microbiology, ancillary and laboratory) are listed below for reference.     Microbiology: No results found for this or any previous visit (from the past 240 hour(s)).   Labs: BNP (last 3 results) No results for input(s): BNP in the last 8760 hours. Basic Metabolic Panel: Recent Labs  Lab 08/21/17 1556  NA 140  K 4.0  CL 103  CO2 28  GLUCOSE 105*  BUN <5*  CREATININE 0.59  CALCIUM 9.7   Liver Function Tests: No results for input(s): AST, ALT, ALKPHOS, BILITOT, PROT, ALBUMIN in the last 168 hours. No results for input(s): LIPASE, AMYLASE in the last 168 hours. No results for input(s): AMMONIA in the last 168 hours. CBC: Recent Labs  Lab 08/21/17 1556  WBC 8.2  HGB 13.0  HCT 40.2  MCV 85.2  PLT 284   Cardiac Enzymes: No results for input(s): CKTOTAL, CKMB, CKMBINDEX, TROPONINI in the last 168 hours. BNP: Invalid input(s): POCBNP CBG: No results for input(s): GLUCAP in the last 168 hours. D-Dimer No results for input(s): DDIMER in the last 72 hours. Hgb A1c No results  for input(s): HGBA1C in the last 72 hours. Lipid Profile No results for input(s): CHOL, HDL, LDLCALC, TRIG, CHOLHDL, LDLDIRECT in the last 72 hours. Thyroid function studies Recent Labs     08/22/17 1126  TSH 0.980   Anemia work up No results for input(s): VITAMINB12, FOLATE, FERRITIN, TIBC, IRON, RETICCTPCT in the last 72 hours. Urinalysis    Component Value Date/Time   COLORURINE STRAW (A) 08/21/2017 1800   APPEARANCEUR CLEAR 08/21/2017 1800   LABSPEC 1.005 08/21/2017 1800   PHURINE 6.0 08/21/2017 1800   GLUCOSEU NEGATIVE 08/21/2017 1800   HGBUR NEGATIVE 08/21/2017 1800   BILIRUBINUR NEGATIVE 08/21/2017 1800   KETONESUR NEGATIVE 08/21/2017 1800   PROTEINUR NEGATIVE 08/21/2017 1800   NITRITE NEGATIVE 08/21/2017 1800   LEUKOCYTESUR NEGATIVE 08/21/2017 1800   Sepsis Labs Invalid input(s): PROCALCITONIN,  WBC,  LACTICIDVEN Microbiology No results found for this or any previous visit (from the past 240 hour(s)).   Time coordinating discharge: Over 30 minutes  SIGNED:   Debbe Odea, MD  [facility] 08/24/2017, 3:09 PM Pager   If 7PM-7AM, please contact night-coverage [email or web address] Password [email or web address]

## 2017-08-24 NOTE — Care Management (Signed)
Case manager sent referral to [facility] outpatient rehab for PT/OT.

## 2017-08-30 LAB — ACETYLCHOLINE RECEPTOR AB, ALL
ACETYLCHOL BLOCK AB: 73 % — AB (ref 0–25)
Acety choline binding ab: 7.86 nmol/L — ABNORMAL HIGH (ref 0.00–0.24)
Acetylcholine Modulat Ab: 59 % — ABNORMAL HIGH (ref 0–20)

## 2017-09-06 ENCOUNTER — Ambulatory Visit: Payer: Managed Care, Other (non HMO) | Admitting: Neurology

## 2017-09-06 ENCOUNTER — Encounter: Payer: Self-pay | Admitting: Neurology

## 2017-09-06 VITALS — BP 100/70 | HR 74 | Ht 65.0 in | Wt 217.0 lb

## 2017-09-06 DIAGNOSIS — G7001 Myasthenia gravis with (acute) exacerbation: Secondary | ICD-10-CM

## 2017-09-06 MED ORDER — PYRIDOSTIGMINE BROMIDE 60 MG PO TABS: ORAL_TABLET | ORAL | 3 refills | Status: DC

## 2017-09-06 MED ORDER — PREDNISONE 10 MG PO TABS: 30.0000 mg | ORAL_TABLET | Freq: Every day | ORAL | 3 refills | Status: DC

## 2017-09-06 NOTE — Progress Notes (Signed)
[facility] Clinic Note - Initial Visit   Date: 09/06/17  Suzanne Nguyen MRN: [id number] DOB: 1995-09-29   Dear Dr. Wynelle Cleveland:  Thank you for your kind referral of Suzanne Nguyen for consultation of right ptosis. Although her history is well known to you, please allow [place] to reiterate it for the purpose of our medical record. The patient was accompanied to the clinic by self.   History of Present Illness: Suzanne Nguyen is a 22 y.o. right-handed African American female with PCOS presenting for evaluation of right ptosis.    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  She was on spring break at home in [place] when she began noticing that her smile in photos appeared flat.  This was followed by difficulty chewing and especially getting tired easily. Over the next week, her right eye started to droop.  When driving, she would always have to close one eye because of double vision.  Symptoms are always worse at the end of the day.  After she returned to [facility], she saw her eye doctor who sent her to [facility] for further evaluation and was admitted from 3/19 - 3/22.  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  There was high clinical suspicion for myasthenia so AChR antibodies were sent and have returned positive.  She was started on mestinon '30mg'$  TID and prednisone '10mg'$ .  She takes mestinon '30mg'$  at 7am, 3pm, and 10pm which has improved her ability to open her eyes.  She initially has some cramping with mestinon.  Prednisone tends to make her anxious and she is inquiring about alterative medications.    She continues to have double vision with objects on top of each other, fatigue with chewing, leg weakness, difficulty swallowing solids, and has slurred speech with nasal voice as the day progresses.  She has mild dyspnea with  exertion, especially when walking to her classes.  She stopped driving because vision changes.  She is mostly bothered by her difficulty chewing and wants to get back to eating regular foods.  She is drinking ensure and shakes to help supplement meals.  She has lost ~8 lb over the past month.    She will be returning home to [place] in the first week of May and would like to pursue thymectomy while she is on summer break closer to home.   Out-side paper records, electronic medical record, and images have been reviewed where available and summarized as:  Labs 08/22/2017:  AChR binding 7.86*, blocking 73*, modulating 58*, TSH 0.98, SSA/B neg, ACE 62, ANA neg, HIV neg, CRP 3.3*, ESR 28*  CT head and neck w contrast 08/22/2017:   1. Anterior mediastinal mass, most consistent with thymoma. This supports the clinical concern for myasthenia gravis. 2. No arterial abnormality of the head or neck. 3. No intracranial abnormality.  MRI brain wwo contrast 08/22/2017:   1. Negative brain MRI. No acute intracranial abnormality identified. 2. Partially empty sella.  Past Medical History:  Diagnosis Date  . Bronchitis   . Myasthenia gravis associated with thymoma ([place])   . PCOS (polycystic ovarian syndrome)   . Pneumonia     Past Surgical History:  Procedure Laterality Date  . None       Medications:  Outpatient Encounter Medications as of 09/06/2017  Medication Sig  . ibuprofen (ADVIL,MOTRIN) 200 MG tablet Take 200 mg  by mouth every 6 (six) hours as needed for mild pain.  . Multiple Vitamin (MULTIVITAMIN) tablet Take 1 tablet by mouth daily.  . predniSONE (DELTASONE) 10 MG tablet Take 3 tablets (30 mg total) by mouth daily with breakfast.  . pyridostigmine (MESTINON) 60 MG tablet Take 1 tablet at 7am, noon, and 5pm.  . VITAMIN D, ERGOCALCIFEROL, PO Take by mouth.  . [DISCONTINUED] predniSONE (DELTASONE) 10 MG tablet Take 1 tablet (10 mg total) by mouth daily with breakfast.  . [DISCONTINUED]  pyridostigmine (MESTINON) 60 MG tablet Take 0.5 tablets (30 mg total) by mouth every 8 (eight) hours.   No facility-administered encounter medications on file as of 09/06/2017.      Allergies: No Known Allergies  Family History: Family History  Problem Relation Age of Onset  . Gastric cancer Mother   . Hypertension Mother   . Diabetes Father   . Hypertension Father   . Heart disease Father   . Cancer Maternal Grandmother   . Heart disease Maternal Grandmother   . Lung cancer Maternal Grandfather   . Heart disease Maternal Grandfather   . Stroke Maternal Grandfather   . Diabetes Maternal Grandfather   . Diabetes Paternal Grandmother     Social History: Social History   Tobacco Use  . Smoking status: Never Smoker  . Smokeless tobacco: Never Used  Substance Use Topics  . Alcohol use: No  . 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   EYES: +visual changes or eye pain ENT: No hearing changes.  No history of nose bleeds.   RESPIRATORY: No cough, wheezing and shortness of breath.   CARDIOVASCULAR: Negative for chest pain, and palpitations.   GI: + abdominal discomfort, blood in stools or black stools.  No recent change in bowel habits.   GU:  No history of incontinence.   MUSCLOSKELETAL: No history of joint pain or swelling.  No myalgias.   SKIN: Negative for lesions, rash, and itching.   HEMATOLOGY/ONCOLOGY: Negative for prolonged bleeding, bruising easily, and swollen nodes.  No history of cancer.   ENDOCRINE: Negative for cold or heat intolerance, polydipsia or goiter.   PSYCH:  No depression +anxiety symptoms.   NEURO: As Above.   Vital Signs:  BP 100/70   Pulse 74   Ht '5\' 5"'$  (1.651 m)   Wt 217 lb (98.4 kg)   SpO2 98%   BMI 36.11 kg/m    General Medical Exam:   General:  Well appearing, comfortable.     Eyes/ENT: see cranial nerve examination.   Neck: No masses appreciated.  Full range of motion without tenderness.  No carotid bruits. Respiratory:  Clear to auscultation, good air entry bilaterally.   Cardiac:  Regular rate and rhythm, no murmur.   Extremities:  No deformities, edema, or skin discoloration.  Skin:  No rashes or lesions.  Neurological Exam: MENTAL STATUS including orientation to time, place, person, recent and remote memory, attention span and concentration, language, and fund of knowledge is normal.  Speech is not dysarthric.  CRANIAL NERVES: II:  No visual field defects.  Unremarkable fundi.   III-IV-VI: Pupils equal round and reactive to light.  Normal conjugate, extra-ocular eye movements in all directions of gaze.  No nystagmus.  Subtle right ptosis, worse with sustained upgaze.   V:  Normal facial sensation.  VII:  Face is symmetric, smile is transverse.  Frontalis is 5/5, orbicularis oculi 5-/5 bilaterally, buccinator 4/5, orbicularis oris 5-/5.   VIII:  Normal hearing and vestibular function.   IX-X:  Normal palatal movement.   XI:  Normal shoulder shrug and head rotation.   XII:  Normal tongue strength and range of motion, no deviation or fasciculation.  MOTOR:  No atrophy, fasciculations or abnormal movements.  No pronator drift.  Tone is normal.   Neck flexion is 5/5.  No fatigability with repeated testing of proximal muscles.  Right Upper Extremity:    Left Upper Extremity:    Deltoid  5/5   Deltoid  5/5   Biceps  5/5   Biceps  5/5   Triceps  5/5   Triceps  5/5   Wrist extensors  5/5   Wrist extensors  5/5   Wrist flexors  5/5   Wrist flexors  5/5   Finger extensors  5/5   Finger extensors  5/5   Finger flexors  5/5   Finger flexors  5/5   Dorsal interossei  5/5   Dorsal interossei  5/5   Abductor pollicis  5/5   Abductor pollicis  5/5   Tone (Ashworth scale)  0  Tone (Ashworth scale)  0   Right Lower Extremity:    Left Lower Extremity:    Hip  flexors  5/5   Hip flexors  5/5   Hip extensors  5/5   Hip extensors  5/5   Knee flexors  5/5   Knee flexors  5/5   Knee extensors  5/5   Knee extensors  5/5   Dorsiflexors  5/5   Dorsiflexors  5/5   Plantarflexors  5/5   Plantarflexors  5/5   Toe extensors  5/5   Toe extensors  5/5   Toe flexors  5/5   Toe flexors  5/5   Tone (Ashworth scale)  0  Tone (Ashworth scale)  0   MSRs:  Right                                                                 Left brachioradialis 2+  brachioradialis 2+  biceps 2+  biceps 2+  triceps 2+  triceps 2+  patellar 2+  patellar 2+  ankle jerk 2+  ankle jerk 2+  plantar response down  plantar response down   SENSORY:  Normal and symmetric perception of light touch, pinprick, vibration.    COORDINATION/GAIT: Normal finger-to- nose-finger and heel-to-shin.  Intact rapid alternating movements bilaterally.  Able to rise from a chair without using arms.  Gait narrow based and stable. Tandem and stressed gait intact.    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  I had an extensive discussion with the patient regarding the diagnosis, pathogenesis, prognosis, management plan, meds to avoid, and potential crisis myasthenia gravis. I also discussed the warning signs and symptoms of MG crisis (including significant swallowing and breathing difficulty) that should prompt urgent medical evaluation since an exacerbation of myasthenia gravis is potentially life-threatening. He was also provided with a list of medications that can worsen MG and that he should avoid. She was told to present this list any physician prescribing medications,  as well as any pharmacist issuing over-the-counter drugs.  Her exam today shows mild bulbar weakness and intact limb strength.  She did report taking mestinon about 2 hours prior to her visit and notices progress weakness by early afternoon and  especially the evening, especially before her next mestinon dose. I will increase and adjust the timing of her mestinon to cover daytime symptoms.  Additionally, her immunosuppression with prednisone will be increased to '30mg'$  daily.  Hopefully, once she has improved, I will be able to taper her back down with a goal of < '20mg'$ /d.  Most importantly, I feel that thymectomy is going to make the biggest impact in her overall disease course and she has already been talking to her mother about getting this performed over summer break when she is back in [place].  I have urged her to start to establish care with a neurologist as she will need close monitoring and cardiothoracic surgery referral over the summer.  Finally, I also talked to her about the role of IVIG for acute exacerbation, especially if symptoms cannot be well-controlled on corticosteroids.  I will start prior authorization and keep this available, should she decline or not improve with her current regimen.    PLAN/RECOMMENDATIONS:  Increase prednisone to '30mg'$  daily. She was counseled on the potential adverse effects of high-dose steroid therapy including, but not limited to hypertension, secondary diabetes mellitus, susceptibility to infections, anxiety, insomnia, weight gain, bone issues and cataracts.  A patient information sheet was also provided. Increase mestinon to '60mg'$  at 7am, noon, and 5pm Start calcium 1200U and vitamin D Start pepcid '20mg'$  daily Start PA for IVIG 2g/kg over 5 days Patient to establish care with neurologist/neuromuscular specialist in the [place] region for management during the summer and to coordinate thymectomy  She will call or send MyChart update in 2 weeks and I will see her back in the clinic for follow-up in three weeks.   Thank you for allowing me to participate in patient's care.  If I can answer any additional questions, I would be pleased to do so.    Sincerely,    Donika K. Posey Pronto, DO

## 2017-09-06 NOTE — Patient Instructions (Addendum)
Increase mestinon to 60mg  at 7am, noon, and 5pm.   Increase prednisone 30mg  daily  Start calcium 1200u with vitamin D  Start pepcid 20mg  daily  Call to establish care with a neuromuscular specialist in the [place] area  Please talk to your family about thymectomy which you can arrange when you are home for the summer  Call with update in 2 weeks  Return to clinic on April 23rd at [place]  Some other items of importance that you should be aware of.  1. It would be great if you would signed up for the [place] Patient Registry if you have not already done so. Please go to the [email or web address].org website for more information. This MG Registry is teaching [place] more about the manifestations, natural history and treatments of MG. Please consider signing up for this voluntary program. Thank you.  2. Please report any worsening of your MG. If significant (for example, worsening shortness of breath, neck weakness, slurred speech, swallowing trouble), you should consider the emergency room.  3. Please note that there are certain drugs, that might be prescribed by other doctors, that can potentially worsen your MG (see below). Also, this list of drugs can be found on the "[facility]" cellphone app or at the [email or web address].org website. If your doctor wants to start a medication, please review with him or her this list. Below is the statement we (the Medical/Scientific Advisory Board of the MGFA) created (see below) in 2013. The MGFA statement below also provides advice on vaccinations (see below also).  Drugs to Avoid, if possible: "Many different drugs have been associated with worsening myasthenia gravis (MG). However, these drug associations do not necessarily mean that a patient with MG should not be prescribed these medications because in many instances the reports are very rare and in some instances they might only be a "chance" association (i.e. not causal). Also some of these  drugs may be necessary for a patient's treatment. Therefore, some of these drugs should not necessarily be considered "off limits" for MG patients.  Careful thought needs to go into decisions about prescription. It is advisable that patients and physicians recognize and discuss the possibility that a particular drug might worsen the patient's MG. They should also consider, when appropriate, the pros and cons of an alternate treatment, if available. It is important that the patient notify his or her physicians if the symptoms of MG worsen after starting any new medication." Read more here.  Below is listed only the more common prescription drugs with the strongest evidence suggesting an association with worsening MG. Should you have ANY questions about medications that your are prescribed please call the office or have your physician call the [facility] Neuromuscular Disorders clinic.  Some of the more common prescription drugs associated with worsening MG. (See "read this first" for more information. Also see myasthenia.org for more information.)  Telithromycin ([place]) - inpatient drug for serious pneuomonia. Should not be used in patients with MG. FDA has designated a "BLACK BOX" warning on this drug in MG patients.  Ciprofloxacin and levofloxacin - commonly prescribed antibiotics that are rarely associated with worsening MG. FDA has designated a "BLACK BOX" warning on Ciprofloxacin. Use cautiously, if at all.  Zithromax (e.g. "Z-pak") - commonly prescribed but potentially dangerous in MG. Use cautiously, if at all.  Gentamycin, neomycin (aminoglycoside antibiotics; tobramycin may be least offensive) - use cautiously if no alternative treatment available.  Other antibiotics have been rarely reported with  worsening MG. Please discuss with physician.  Botulinum toxin (e.g. "Botox") - avoid.  Steroids (e.g. prednisone) - steroids are a common treatment for MG but patients who start steroids may have  transient worsening of their MG during the first two weeks prior to an improvement in their MG. Thus, patients need to monitor carefully for this possibility.  Quinine - sometimes used for leg cramps.  Procainamide - cardiac drug used for irregular heart rhythm.  Magnesium in patients with kidney disease; potentially dangerous if given intravenous, for example, for eclampsia treatment during late pregnancy. (Many multivitamins contain small amounts of magnesium, which is okay.)  D-penicillamine - drug rarely used these days but strongly associated with causing MG.  Beta-blockers - commonly prescribed for hypertension, heart disease and migraine but potentially dangerous in MG. Use cautiously, if necessary.  Vaccinations  Vaccinations: It is generally believed that vaccinations (e.g. influenza) are safe in patients with MG (with a major caveat below). The evidence suggests that vaccine-related worsening of MG is rare and thus most MG specialists believe the benefits of immunization outweigh any small risk related to possible transient worsening of MG symptoms.  Exception/caveat: If you are taking immunosuppressive medication, such as Prednisone, Azathioprine or Mycophenolate, it is usually recommended that you avoid LIVE, ATTENUATED vaccines. Examples of live, attenuated vaccines include the shingles vaccine and the nasal spray form of the influenza vaccine (the influenza injection is inactivated and thus not alive, so it is much safer in immunosuppressed patients). You need to discuss this with any doctor when considering a vaccine. If you are not sure, you should ask your doctor if you are taking immunosuppressive drugs and, if so, if the vaccine is safe in that setting. It's worth noting that most vaccines are inactivated (e.g. dead), but because there are a few vaccines that are alive and attenuated (i.e. the pathogen is alive but not very virulent and thus immunizes the patient without causing  the disease) and because the live, attenuated vaccines carry higher risk for those who are immunosuppressed, this technicality about vaccines is important and is always worth consideration.  4. The MGFA publishes a comprehensive book for healthcare providers that you should be aware of; Myasthenia Gravis. Handbook for the Healthcare Provider is available on line at PhoneDigit.fr, or may be purchased from [employer] in book format. It is recommend that you download the title page, table of contents and appropriate chapter for your particular healthcare provider, e.g. primary care physician, dentist, pharmacist, etc. There are additional special chapters for therapists, anesthesiologists, social workers that you will find valuable.  5. The "MyMG" cellphone app is available in both iPhone or Android versions from [employer] or [employer] respectively. This app will allow you to track your MG symptoms, record notes that you may think explain changes in your symptoms. The app will chart your results so you can discuss these changes with your physician at the time of your visit. If you have the "[facility]" cellphone app please bring it with you to each clinic visit in order that we may review the data you have gathered.

## 2017-09-13 ENCOUNTER — Encounter: Payer: Self-pay | Admitting: Neurology

## 2017-09-25 ENCOUNTER — Ambulatory Visit: Payer: Managed Care, Other (non HMO) | Admitting: Neurology

## 2017-09-25 ENCOUNTER — Encounter: Payer: Self-pay | Admitting: Neurology

## 2017-09-25 ENCOUNTER — Other Ambulatory Visit (INDEPENDENT_AMBULATORY_CARE_PROVIDER_SITE_OTHER): Payer: Managed Care, Other (non HMO)

## 2017-09-25 VITALS — BP 120/70 | HR 98 | Ht 65.0 in | Wt 218.4 lb

## 2017-09-25 DIAGNOSIS — G7001 Myasthenia gravis with (acute) exacerbation: Secondary | ICD-10-CM | POA: Diagnosis not present

## 2017-09-25 DIAGNOSIS — R5383 Other fatigue: Secondary | ICD-10-CM

## 2017-09-25 LAB — VITAMIN D 25 HYDROXY (VIT D DEFICIENCY, FRACTURES): VITD: 16.45 ng/mL — AB (ref 30.00–100.00)

## 2017-09-25 MED ORDER — PREDNISONE 20 MG PO TABS: 40.0000 mg | ORAL_TABLET | Freq: Every day | ORAL | 3 refills | Status: DC

## 2017-09-25 MED ORDER — PYRIDOSTIGMINE BROMIDE 60 MG PO TABS: ORAL_TABLET | ORAL | 3 refills | Status: DC

## 2017-09-25 NOTE — Patient Instructions (Addendum)
Increase prednisone 40mg  daily.   Continue mestinon 60mg  at 7am, noon, 5pm, and 10pm Recommend seeing a cardiothoracic surgeon for thymectomy while you are home I will send your notes to Dr. Junious Silk  Please call  [phone] to request CD image of your CT Chest. Check vitamin D Return to clinic in September 2019

## 2017-09-25 NOTE — Progress Notes (Signed)
Follow-up Visit   Date: 09/25/17    Suzanne Nguyen MRN: [id number] DOB: 09/14/1995   Interim History: Suzanne Nguyen is a 22 y.o. -handed African American female with PCOS returning to the clinic for follow-up of seropositive myasthenia gravis.  The patient was accompanied to the clinic by self.  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  She was on spring break at home in [place] when she began noticing that her smile in photos appeared flat.  This was followed by difficulty chewing and especially getting tired easily. Over the next week, her right eye started to droop.  When driving, she would always have to close one eye because of double vision.  Symptoms are always worse at the end of the day.  After she returned to [facility], she saw her eye doctor who sent her to [facility] for further evaluation and was admitted from 3/19 - 3/22.  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  There was high clinical suspicion for myasthenia so AChR antibodies were sent and have returned positive.  She was started on mestinon '30mg'$  TID and prednisone '10mg'$ .  She takes mestinon '30mg'$  at 7am, 3pm, and 10pm which has improved her ability to open her eyes.  She initially has some cramping with mestinon.  Prednisone tends to make her anxious and she is inquiring about alterative medications.    She continues to have double vision with objects on top of each other, fatigue with chewing, leg weakness, difficulty swallowing solids, and has slurred speech with nasal voice as the day progresses.  She has mild dyspnea with exertion, especially when walking to her classes.  She stopped driving because vision changes.  She is mostly bothered by her difficulty chewing and wants to get back to eating regular foods.  She is drinking ensure and shakes to  help supplement meals.  She has lost ~8 lb over the past month.    She will be returning home to [place] in the first week of May and would like to pursue thymectomy while she is on summer break closer to home.  UPDATE 09/25/2017:  She is here for 3 week follow-up visit.  Last visit, prednisone was increased to 30 mg daily which she initially felt helped her ptosis. There has been some improvement with respect to her swallowing in speech usually in the morning and afternoon, however, as the day progresses, her speech becomes nasal and she is still unable to eat harder textured foods.  Over the past week, she started using chlorpheniramine for seasonal allergies and feels that her right eye has become weaker and double vision is worse.  She has a prism for her glasses which has not helped.  She denies any limb weakness, shortness of breath, or neck heaviness.  She has not started IVIG yet.  Medications:  Current Outpatient Medications on File Prior to Visit  Medication Sig Dispense Refill  . CALCIUM PO Take by mouth.    Marland Kitchen ibuprofen (ADVIL,MOTRIN) 200 MG tablet Take 200 mg by mouth every 6 (six) hours as needed for mild pain.    . Multiple Vitamin (MULTIVITAMIN) tablet Take 1 tablet by mouth daily.    Marland Kitchen OVER THE COUNTER MEDICATION [facility]    . VITAMIN D, ERGOCALCIFEROL, PO Take by mouth.     No current facility-administered medications on  file prior to visit.     Allergies: No Known Allergies  Review of Systems:  CONSTITUTIONAL: No fevers, chills, night sweats, or weight loss.  EYES: +visual changes or eye pain ENT: No hearing changes.  No history of nose bleeds.   RESPIRATORY: No cough, wheezing and shortness of breath.   CARDIOVASCULAR: Negative for chest pain, and palpitations.   GI: Negative for abdominal discomfort, blood in stools or black stools.  No recent change in bowel habits.   GU:  No history of incontinence.   MUSCLOSKELETAL: No history of joint pain or swelling.  No myalgias.    SKIN: Negative for lesions, rash, and itching.   ENDOCRINE: Negative for cold or heat intolerance, polydipsia or goiter.   PSYCH:  No depression or anxiety symptoms.   NEURO: As Above.   Vital Signs:  BP 120/70   Pulse 98   Ht '5\' 5"'$  (1.651 m)   Wt 218 lb 6 oz (99.1 kg)   SpO2 98%   BMI 36.34 kg/m    General: Well-appearing, moderate R ptosis Neck: No carotid bruit CV: Regular rate and rhythm Pulm:  Clear Ext: no edema  Neurological Exam: MENTAL STATUS including orientation to time, place, person, recent and remote memory, attention span and concentration, language, and fund of knowledge is normal.  Speech is not dysarthric.  CRANIAL NERVES: No visual field defects.  Pupils equal round and reactive to light.  Right eye with incomplete horizontal gaze and vertical gaze at end-point; left eye movements are intact.  Moderate right ptosis with the eyelid covering >50% of the pupil; moderate on the left where the eyelid sits above the pupil (worse).  There is weakness with orbicularis oculi 4+/5 and evidence of Cogen lid twitch on the right.  Buccinator and orbicularis oris muscles are also 4+/5.  Face is symmetric, smile is less transverse today. Palate elevates symmetrically.   MOTOR:  Motor strength is 5/5 in all extremities, including neck flexion.    MSRs:  Reflexes are 2+/4 throughout.  SENSORY:  Intact to vibration.  COORDINATION/GAIT:  She is able to stand up from chair without pushing off with arms. Gait narrow based and stable.   Data: Labs 08/22/2017:  AChR binding 7.86*, blocking 73*, modulating 58*, TSH 0.98, SSA/B neg, ACE 62, ANA neg, HIV neg, CRP 3.3*, ESR 28*  Lab Results  Component Value Date   CREATININE 0.59 08/21/2017   BUN <5 (L) 08/21/2017   NA 140 08/21/2017   K 4.0 08/21/2017   CL 103 08/21/2017   CO2 28 08/21/2017    CT head and neck w contrast 08/22/2017:   1. Anterior mediastinal mass, most consistent with thymoma. This supports the clinical  concern for myasthenia gravis. 2. No arterial abnormality of the head or neck. 3. No intracranial abnormality.  MRI brain wwo contrast 08/22/2017:   1. Negative brain MRI. No acute intracranial abnormality identified. 2. Partially empty sella.   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  Her exam today shows interval worsening and most likely due to using chlorpheniramine for seasonal allergies.  Exam shows moderate right ptosis, right ophthalmoplegia, and facial weakness.  There is no limb weakness or neck flexion weakness She has finally been approved for IVIG and will be coordinating with [facility] when to start infusions, there was delayed due to insurance approval Increase prednisone '40mg'$  daily due to minimal improvement on '30mg'$  with hopes to taper this going foward Continue mestinon '60mg'$  at  7am, noon, 5pm, and 10pm Stop chlorpheniramine, avoid anticholinergic medications as this has most likely contributed to her recent worsening of symptoms OK to use claritin for allergies She will be seeing a neurologist, Dr. Junious Silk, at [facility] on May 30th at which time decision for steroid tapering can be made, if clinically improved.  She will also need to be referred to cardiothoracic surgery in [place] for thymectomy as I anticipate she will have better disease control once this is removed.  She has been informed to take CD image of her CT chest to her appointment.  I will be sure to fax my office notes to Dr. Thomasene Lot.  She is requesting to have vitamin D level check for fatigue.  She will return to see me in September when she is back for fall semester   The duration of this appointment visit was 30 minutes of face-to-face time with the patient.  Greater than 50% of this time was spent in counseling, explanation of diagnosis, planning of further management, and coordination of care.   Thank you for  allowing me to participate in patient's care.  If I can answer any additional questions, I would be pleased to do so.    Sincerely,    Lailyn Appelbaum K. Posey Pronto, DO

## 2017-09-26 ENCOUNTER — Ambulatory Visit: Payer: Managed Care, Other (non HMO) | Admitting: Neurology

## 2017-10-09 ENCOUNTER — Encounter: Payer: Self-pay | Admitting: Neurology

## 2017-10-11 ENCOUNTER — Encounter: Payer: Self-pay | Admitting: Neurology

## 2017-10-11 ENCOUNTER — Ambulatory Visit: Payer: Managed Care, Other (non HMO) | Admitting: Neurology

## 2017-10-11 VITALS — BP 118/90 | HR 100 | Ht 65.0 in | Wt 220.5 lb

## 2017-10-11 DIAGNOSIS — G7000 Myasthenia gravis without (acute) exacerbation: Secondary | ICD-10-CM | POA: Diagnosis not present

## 2017-10-11 DIAGNOSIS — G44219 Episodic tension-type headache, not intractable: Secondary | ICD-10-CM

## 2017-10-11 DIAGNOSIS — D150 Benign neoplasm of thymus: Secondary | ICD-10-CM | POA: Diagnosis not present

## 2017-10-11 DIAGNOSIS — D4989 Neoplasm of unspecified behavior of other specified sites: Secondary | ICD-10-CM

## 2017-10-11 NOTE — Progress Notes (Signed)
Follow-up Visit   Date: 10/11/17    Suzanne Nguyen MRN: [id number] DOB: 04/20/1996   Interim History: Suzanne Nguyen is a 22 y.o. -handed African American female with PCOS returning to the clinic for follow-up of seropositive myasthenia gravis.  The patient was accompanied to the clinic by self.  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  She was on spring break at home in [place] when she began noticing that her smile in photos appeared flat.  This was followed by difficulty chewing and especially getting tired easily. Over the next week, her right eye started to droop.  When driving, she would always have to close one eye because of double vision.  Symptoms are always worse at the end of the day.  After she returned to [facility], she saw her eye doctor who sent her to [facility] for further evaluation and was admitted from 3/19 - 3/22.  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  There was high clinical suspicion for myasthenia so AChR antibodies were sent and have returned positive.  She was started on mestinon 70m TID and prednisone 187m  She takes mestinon 3022mt 7am, 3pm, and 10pm which has improved her ability to open her eyes.  She initially has some cramping with mestinon.  Prednisone tends to make her anxious and she is inquiring about alterative medications.    She continues to have double vision with objects on top of each other, fatigue with chewing, leg weakness, difficulty swallowing solids, and has slurred speech with nasal voice as the day progresses.  She has mild dyspnea with exertion, especially when walking to her classes.  She stopped driving because vision changes.  She is mostly bothered by her difficulty chewing and wants to get back to eating regular foods.  She is drinking ensure and shakes to  help supplement meals.  She has lost ~8 lb over the past month.    She will be returning home to [place] in the first week of May and would like to pursue thymectomy while she is on summer break closer to home.  UPDATE 09/25/2017:  She is here for 3 week follow-up visit.  Last visit, prednisone was increased to 30 mg daily which she initially felt helped her ptosis. There has been some improvement with respect to her swallowing in speech usually in the morning and afternoon, however, as the day progresses, her speech becomes nasal and she is still unable to eat harder textured foods.  Over the past week, she started using chlorpheniramine for seasonal allergies and feels that her right eye has become weaker and double vision is worse.  She has a prism for her glasses which has not helped.  She denies any limb weakness, shortness of breath, or neck heaviness.  She has not started IVIG yet.  UPDATE 10/11/2017:  She has completed induction dose of IVIG last week and began noticing improved double vision and less droopiness of her eyelids.  She is also back to eating regular textured foods and voice is much stronger.  Over the past few days, she no longer has any double vision, problems with swallowing or talking.  Occasionally, she continues to have mild droopiness of the eye, worse on the left.  Over the past weekend, she had random muscle twitches.  She also complains migraine which started  earlier today and usually lasts an hour.   Medications:  Current Outpatient Medications on File Prior to Visit  Medication Sig Dispense Refill  . CALCIUM PO Take by mouth.    Marland Kitchen ibuprofen (ADVIL,MOTRIN) 200 MG tablet Take 200 mg by mouth every 6 (six) hours as needed for mild pain.    . Multiple Vitamin (MULTIVITAMIN) tablet Take 1 tablet by mouth daily.    Marland Kitchen OVER THE COUNTER MEDICATION [facility]    . predniSONE (DELTASONE) 20 MG tablet Take 2 tablets (40 mg total) by mouth daily with breakfast. 60 tablet 3  .  pyridostigmine (MESTINON) 60 MG tablet Take 1 tablet at 7am, noon, 5pm, and 10pm. 120 tablet 3  . VITAMIN D, ERGOCALCIFEROL, PO Take by mouth.     No current facility-administered medications on file prior to visit.     Allergies: No Known Allergies  Review of Systems:  CONSTITUTIONAL: No fevers, chills, night sweats, or weight loss.  EYES: No visual changes or eye pain ENT: No hearing changes.  No history of nose bleeds.   RESPIRATORY: No cough, wheezing and shortness of breath.   CARDIOVASCULAR: Negative for chest pain, and palpitations.   GI: Negative for abdominal discomfort, blood in stools or black stools.  No recent change in bowel habits.   GU:  No history of incontinence.   MUSCLOSKELETAL: No history of joint pain or swelling.  No myalgias.   SKIN: Negative for lesions, rash, and itching.   ENDOCRINE: Negative for cold or heat intolerance, polydipsia or goiter.   PSYCH:  No depression or anxiety symptoms.   NEURO: As Above.   Vital Signs:  BP 118/90   Pulse 100   Ht _0  (1.651 m)   Wt 220 lb 8 oz (100 kg)   SpO2 98%   BMI 36.69 kg/m    General Medical Exam:   General:  Well appearing, comfortable  Eyes/ENT: see cranial nerve examination.   Neck: No masses appreciated.  Full range of motion without tenderness.  No carotid bruits. Respiratory:  Clear to auscultation, good air entry bilaterally.   Cardiac:  Regular rate and rhythm, no murmur.   Ext:  No edema  Neurological Exam: MENTAL STATUS including orientation to time, place, person, recent and remote memory, attention span and concentration, language, and fund of knowledge is normal.  Speech is not dysarthric (improved).  CRANIAL NERVES:  Pupils equal round and reactive to light.  Normal conjugate, extra-ocular eye movements in all directions of gaze.  No ptosis at rest or with sustained upgaze.  Face is symmetric. Facial muscle including frontalis, orbicularis oculi, orbicularis oris, and buccinator is 5/5.   Palate elevates symmetrically.  Tongue is midline.  MOTOR:  Motor strength is 5/5 in all extremities, including neck flexion.  No pronator drift.  Tone is normal.    MSRs:  Reflexes are 2+/4 throughout  SENSORY:  Intact to vibration throughout  COORDINATION/GAIT:  She is able to stand up from low chair without pushing off. Gait narrow based and stable.    Data: Labs 08/22/2017:  AChR binding 7.86*, blocking 73*, modulating 58*, TSH 0.98, SSA/B neg, ACE 62, ANA neg, HIV neg, CRP 3.3*, ESR 28*  Lab Results  Component Value Date   CREATININE 0.59 08/21/2017   BUN <5 (L) 08/21/2017   NA 140 08/21/2017   K 4.0 08/21/2017   CL 103 08/21/2017   CO2 28 08/21/2017    CT head and neck w contrast 08/22/2017:   1. Anterior  mediastinal mass, most consistent with thymoma. This supports the clinical concern for myasthenia gravis. 2. No arterial abnormality of the head or neck. 3. No intracranial abnormality.  MRI brain wwo contrast 08/22/2017:   1. Negative brain MRI. No acute intracranial abnormality identified. 2. Partially empty sella.   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  She was diagnosed in March 2019 with antibody testing and treated with IVIG last week. Her exam is doing remarkably better after completing induction dose of IVIG last week.  There is resolution of right ophthalmoplegia, R ptosis, and facial weakness.  Bulbar and limb strength is 5/5.   Recommend continuing monthly IVIG 1g/kg Continue prednisone 27m daily, with plans to taper at her next neurology appointment with Dr. HJunious Silkon May 30th. Reduce mestinon to 654mthree times daily (7am, 2pm, and 8pm) Continue calcium and vitamin D supplements Continue pepcid  She will need to be referred to cardiothoracic surgery in [place] for thymectomy as she will be home for the summer  Episodic tension headaches, treat with tylenol and rest. Avoid  NSAIDs.  Follow-up with me in September, once she is back for the fall semester   Thank you for allowing me to participate in patient's care.  If I can answer any additional questions, I would be pleased to do so.    Sincerely,    Donika K. PaPosey ProntoDO

## 2017-10-11 NOTE — Patient Instructions (Addendum)
Continue monthly IVIG  Stay on predinsone 40mg  daily and mestinon 60mg  three times daily  Recommend tapering prednisone at your next neurology visit, if you continue to do well  Continue pepcid and calcium supplements

## 2017-10-23 ENCOUNTER — Encounter: Payer: Self-pay | Admitting: Neurology

## 2017-12-03 HISTORY — PX: THYMECTOMY: SHX1063

## 2017-12-14 ENCOUNTER — Other Ambulatory Visit: Payer: Self-pay | Admitting: *Deleted

## 2017-12-14 MED ORDER — PYRIDOSTIGMINE BROMIDE 60 MG PO TABS: ORAL_TABLET | ORAL | 5 refills | Status: DC

## 2018-01-28 ENCOUNTER — Ambulatory Visit: Payer: Managed Care, Other (non HMO) | Admitting: Neurology

## 2018-02-08 ENCOUNTER — Ambulatory Visit: Payer: Managed Care, Other (non HMO) | Admitting: Neurology

## 2018-02-08 ENCOUNTER — Encounter: Payer: Self-pay | Admitting: Neurology

## 2018-02-08 ENCOUNTER — Other Ambulatory Visit (INDEPENDENT_AMBULATORY_CARE_PROVIDER_SITE_OTHER): Payer: Managed Care, Other (non HMO)

## 2018-02-08 ENCOUNTER — Encounter: Payer: Self-pay | Admitting: *Deleted

## 2018-02-08 VITALS — BP 110/88 | HR 88 | Ht 65.0 in | Wt 206.0 lb

## 2018-02-08 DIAGNOSIS — F418 Other specified anxiety disorders: Secondary | ICD-10-CM | POA: Diagnosis not present

## 2018-02-08 DIAGNOSIS — R4589 Other symptoms and signs involving emotional state: Secondary | ICD-10-CM | POA: Insufficient documentation

## 2018-02-08 DIAGNOSIS — G7000 Myasthenia gravis without (acute) exacerbation: Secondary | ICD-10-CM

## 2018-02-08 DIAGNOSIS — Z9089 Acquired absence of other organs: Secondary | ICD-10-CM

## 2018-02-08 DIAGNOSIS — Z7952 Long term (current) use of systemic steroids: Secondary | ICD-10-CM | POA: Diagnosis not present

## 2018-02-08 LAB — COMPREHENSIVE METABOLIC PANEL
ALBUMIN: 4.6 g/dL (ref 3.5–5.2)
ALK PHOS: 47 U/L (ref 39–117)
ALT: 16 U/L (ref 0–35)
AST: 19 U/L (ref 0–37)
BILIRUBIN TOTAL: 0.4 mg/dL (ref 0.2–1.2)
BUN: 10 mg/dL (ref 6–23)
CO2: 30 mEq/L (ref 19–32)
Calcium: 10.1 mg/dL (ref 8.4–10.5)
Chloride: 102 mEq/L (ref 96–112)
Creatinine, Ser: 0.64 mg/dL (ref 0.40–1.20)
GFR: 149.94 mL/min (ref >60.00)
Glucose, Bld: 98 mg/dL (ref 70–99)
Potassium: 3.8 mEq/L (ref 3.5–5.1)
SODIUM: 139 meq/L (ref 135–145)
Total Protein: 7.4 g/dL (ref 6.0–8.3)

## 2018-02-08 LAB — CBC WITH DIFFERENTIAL/PLATELET
BASOS ABS: 0.1 10*3/uL (ref 0.0–0.1)
Basophils Relative: 0.8 % (ref 0.0–3.0)
Eosinophils Absolute: 0 10*3/uL (ref 0.0–0.7)
Eosinophils Relative: 0.1 % (ref 0.0–5.0)
HCT: 41 % (ref 36.0–46.0)
HEMOGLOBIN: 13.5 g/dL (ref 12.0–15.0)
Lymphocytes Relative: 8.9 % — ABNORMAL LOW (ref 12.0–46.0)
Lymphs Abs: 0.9 10*3/uL (ref 0.7–4.0)
MCHC: 32.9 g/dL (ref 30.0–36.0)
MCV: 89.6 fl (ref 78.0–100.0)
MONO ABS: 0.3 10*3/uL (ref 0.1–1.0)
Monocytes Relative: 2.5 % — ABNORMAL LOW (ref 3.0–12.0)
Neutro Abs: 9.2 10*3/uL — ABNORMAL HIGH (ref 1.4–7.7)
Platelets: 318 10*3/uL (ref 150.0–400.0)
RBC: 4.57 Mil/uL (ref 3.87–5.11)
RDW: 16 % — ABNORMAL HIGH (ref 11.5–15.5)
WBC: 10.5 10*3/uL (ref 4.0–10.5)

## 2018-02-08 MED ORDER — PYRIDOSTIGMINE BROMIDE 60 MG PO TABS
ORAL_TABLET | ORAL | 5 refills | Status: DC
Start: 2018-02-08 — End: 2018-02-25

## 2018-02-08 MED ORDER — PYRIDOSTIGMINE BROMIDE ER 180 MG PO TBCR: 180.0000 mg | EXTENDED_RELEASE_TABLET | Freq: Every day | ORAL | 5 refills | Status: DC

## 2018-02-08 NOTE — Patient Instructions (Addendum)
Reduce mestinon to 1-2 tablet four times at 6am, 11am, 3pm, and 8pm.  Start mestinon timespan 180mg  at bedtime  Restart IVIG  Check labs   Start Cellcept 500mg  twice daily  Continue prednisone 60mg  daily  Start seeing a counselor at [facility] for your anxiety  Return to clinic on 9/24 at 3pm

## 2018-02-08 NOTE — Progress Notes (Signed)
Follow-up Visit   Date: 02/08/18    Suzanne Nguyen MRN: [id number] DOB: 11/22/1995   Interim History: Suzanne Nguyen is a 22 y.o. -handed African American female with PCOS returning to the clinic for follow-up of seropositive myasthenia gravis s/p thymectomy.  The patient was accompanied to the clinic by self.  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  She was on spring break at home in [place] when she began noticing that her smile in photos appeared flat.  This was followed by difficulty chewing and especially getting tired easily. Over the next week, her right eye started to droop.  When driving, she would always have to close one eye because of double vision.  Symptoms are always worse at the end of the day.  After she returned to [facility], she saw her eye doctor who sent her to [facility] for further evaluation and was admitted from 3/19 - 3/22.  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  There was high clinical suspicion for myasthenia so AChR antibodies were sent and have returned positive.  She was started on mestinon 59m TID and prednisone 147m  She takes mestinon 3053mt 7am, 3pm, and 10pm which has improved her ability to open her eyes.  She initially has some cramping with mestinon.  Prednisone tends to make her anxious and she is inquiring about alterative medications.    She continues to have double vision with objects on top of each other, fatigue with chewing, leg weakness, difficulty swallowing solids, and has slurred speech with nasal voice as the day progresses.  She has mild dyspnea with exertion, especially when walking to her classes.  She stopped driving because vision changes.  She is mostly bothered by her difficulty chewing and wants to get back to eating regular foods.  She is drinking ensure  and shakes to help supplement meals.  She has lost ~8 lb over the past month.    She will be returning home to [place] in the first week of May and would like to pursue thymectomy while she is on summer break closer to home.  UPDATE 09/25/2017:  She is here for 3 week follow-up visit.  Last visit, prednisone was increased to 30 mg daily which she initially felt helped her ptosis. There has been some improvement with respect to her swallowing in speech usually in the morning and afternoon, however, as the day progresses, her speech becomes nasal and she is still unable to eat harder textured foods.  Over the past week, she started using chlorpheniramine for seasonal allergies and feels that her right eye has become weaker and double vision is worse.  She has a prism for her glasses which has not helped.  She denies any limb weakness, shortness of breath, or neck heaviness.  She has not started IVIG yet.  UPDATE 10/11/2017:  She has completed induction dose of IVIG last week and began noticing improved double vision and less droopiness of her eyelids.  She is also back to eating regular textured foods and voice is much stronger.  Over the past few days, she no longer has any double vision, problems with swallowing or talking.  Occasionally, she continues to have mild droopiness of the eye, worse on the left.  Over the past weekend, she had random muscle twitches.   UPDATE 02/08/2018:  She has been home for the summer in [place] and followed by Dr. Thomasene Lot at [facility] for myasthenia gravis.  She underwent robotic assisted thymectomy on 7/16 which required taking the left phrenic nerve.  Following surgery, she received IVIG, increased prednisone to 65m daily and then again admitted for plasmapheresis, which was completed in early August.  Her prednisone was increased to 648mdaily and she has noticed improved chewing and double vision has improved since being on this dose, but legs feel weak.  She does  not have shortness of breath or difficulty swallowing.  Double vision especially to the left is unchanged.   She is living on campus apartment on the 3rd floor and has to take frequent breaks to get to her apartment as there is no elevator.  I will write a letter for her apartment complex to make accommodations for first floor apartment.    She endorses having a lot of anxiety and stress related to her health, especially now that she is no longer with her family who live in [place].  She is planning on continued to finish her college here.  Current medication regimen includes:  Mestinon 12079mvery 4 hours (2am, 6am, 10am, 2pm, 6pm, 10pm) = 720m93m prednisone 60mg48mly which she has been for the past 10 days, and IVIG (she does not recall her last dose).   She will be started Cellcept 500mg 5mtoday.   She endorses GI cramping.    Medications:  Current Outpatient Medications on File Prior to Visit  Medication Sig Dispense Refill  . CALCIUM PO Take by mouth.    . ibupMarland Kitchenofen (ADVIL,MOTRIN) 200 MG tablet Take 200 mg by mouth every 6 (six) hours as needed for mild pain.    . Multiple Vitamin (MULTIVITAMIN) tablet Take 1 tablet by mouth daily.    . Mycophenolate Mofetil (CELLCEPT PO) Take by mouth.    . OVERMarland KitchenTHE COUNTER MEDICATION [facility] Allergy    . predniSONE (DELTASONE) 20 MG tablet Take 2 tablets (40 mg total) by mouth daily with breakfast. (Patient taking differently: Take 60 mg by mouth daily with breakfast. ) 60 tablet 3  . pyridostigmine (MESTINON) 60 MG tablet Take one tablet tid at 7am, 2pm and 8pm. (Patient taking differently: Take 2 tablets every 4 hours.) 90 tablet 5  . VITAMIN D, ERGOCALCIFEROL, PO Take by mouth.     No current facility-administered medications on file prior to visit.     Allergies: No Known Allergies  Review of Systems:  CONSTITUTIONAL: No fevers, chills, night sweats, or weight loss.  EYES: +visual changes or eye pain ENT: No hearing changes.  No history of nose  bleeds.   RESPIRATORY: No cough, wheezing and shortness of breath.   CARDIOVASCULAR: Negative for chest pain, and palpitations.   GI: +for abdominal discomfort, blood in stools or black stools.  No recent change in bowel habits.   GU:  No history of incontinence.   MUSCLOSKELETAL: No history of joint pain or swelling.  No myalgias.   SKIN: Negative for lesions, rash, and itching.   ENDOCRINE: Negative for cold or heat intolerance, polydipsia or goiter.   PSYCH:  No depression +anxiety symptoms.   NEURO: As Above.   Vital Signs:  BP 110/88   Pulse 88   Ht _0  (1.651 m)   Wt 206 lb (93.4 kg)   SpO2 98%   BMI 34.28 kg/m    General Medical Exam:   General:  Tired appearing, comfortable  Eyes/ENT:  see cranial nerve examination.   Neck: No masses appreciated.  Full range of motion without tenderness.  No carotid bruits. Respiratory:  Clear to auscultation, good air entry bilaterally.   Cardiac:  Regular rate and rhythm, no murmur.  Left thoracic scar from thymectomy is healing well. Ext:  No edema  Neurological Exam: MENTAL STATUS including orientation to time, place, person, recent and remote memory, attention span and concentration, language, and fund of knowledge is normal.  Speech is not dysarthric.  CRANIAL NERVES:  Pupils equal round and reactive to light.  Left gaze is restricted past midline in the right >> left eye.  Left eye upgaze is also mildly restricted.  Right gaze and down gaze is intact.  Mild bilateral ptosis with worsening with upgaze.  Facial muscles show mild weakness - frontalis, orbicularis oculi, oris, and buccinator is 4/5.  No ptosis at rest or with sustained upgaze.  Face is symmetric - mild transverse smile. Palate elevates symmetrically.  Tongue is midline.  MOTOR:  Motor strength is 5/5 in all extremities, including neck flexion. No fatigability. No pronator drift.  Tone is normal.    MSRs:  Reflexes are 2+/4 throughout  COORDINATION/GAIT:  She is able  to stand up from low chair without pushing off very easily. Gait narrow based and stable.    Data: Labs 08/22/2017:  AChR binding 7.86*, blocking 73*, modulating 58*, TSH 0.98, SSA/B neg, ACE 62, ANA neg, HIV neg, CRP 3.3*, ESR 28*  Lab Results  Component Value Date   CREATININE 0.59 08/21/2017   BUN <5 (L) 08/21/2017   NA 140 08/21/2017   K 4.0 08/21/2017   CL 103 08/21/2017   CO2 28 08/21/2017    CT head and neck w contrast 08/22/2017:   1. Anterior mediastinal mass, most consistent with thymoma. This supports the clinical concern for myasthenia gravis. 2. No arterial abnormality of the head or neck. 3. No intracranial abnormality.  MRI brain wwo contrast 08/22/2017:   1. Negative brain MRI. No acute intracranial abnormality identified. 2. Partially empty sella.   IMPRESSION/PLAN: Seropositive generalized myasthenia gravis s/p thymectomy with exacerbation.  Diagnosed in March 2019.  She was briefly being managed at [facility] by Dr. Thomasene Lot due to being home for the summer break where she underwent thymectomy in July 2019 and has a course of IVIG and PLEX in early August.  Due to ongoing bulbar weakness, her prednisone was increased to 31m daily which she has been taking for the past 10 days.  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  Her limb strength and neck flexion/extension is intact.  Although she does have myasthenic exacerbation, she may also have some degree of cholinergic weakness from excessive mestinon (7612md), as well as generalized subjective weakness from overlay of anxiety related to her new diagnosis of MG and it is important to distinguish amongst these possibilities.    To reduce potential for cholinergic weakness, I recommend reducing mestinon to 9067mt 6am, 11am, 3pm, and 8pm.  OK to take extra tablet, as needed daily.  I personally called patient 6 hours later to follow-up on how she did with  this adjustment and she has started to extend time window between doses and feels abdominal cramping is already better.  Her voice was strong, not nasal or dysarthric. To help with morning symptoms, start mestinon timespan 180m31m bedtime.  Restart IVIG 1g/kg every 21 days Continue prednisone 60mg79mly Start Cellcept 500mg59m  twice daily.  Check CBC with diff and CMP weekly x four weeks, the monthly x 3 months, then every 3 months  If I am unable to control her symptoms maximal medical therapy as noted above, will need to consider [employer] as an options. She will need meningococcal vaccination prior to this.  Letter was provided recommending 1st floor apartment due to weakness  Long term use of corticosteroids. Side effects were discussed. 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  Strongly urged her to establish care with counselor service which is available on campus  Return to clinic in 2-3 weeks   Thank you for allowing me to participate in patient's care.  If I can answer any additional questions, I would be pleased to do so.    Sincerely,    Donika K. Posey Pronto, DO

## 2018-02-11 ENCOUNTER — Encounter: Payer: Self-pay | Admitting: *Deleted

## 2018-02-11 NOTE — Progress Notes (Signed)
Patient will be in on Friday

## 2018-02-12 ENCOUNTER — Telehealth: Payer: Self-pay | Admitting: Neurology

## 2018-02-12 NOTE — Telephone Encounter (Signed)
Patient called and is needing to speak with you regarding her medication and not being able to get up this morning without help. She needs to know whether to continue it or not? Please Call. Thanks

## 2018-02-12 NOTE — Telephone Encounter (Signed)
I spoke with patient via My Chart.

## 2018-02-12 NOTE — Telephone Encounter (Signed)
Returned call to patient reviewing her symptoms and medications. She is currently taking mestinon 60mg  four times daily (6am, 11am, 3pm, and 8pm) which controls daytime symptoms and she reports having more energy, compared to when she was taking 120mg  6 times daily (prescribed by outside provider).    She took mestinon timespan the first night and felt very well the next morning, however, the past two morning she has woken up feeling very weak and needing assistance to sit up.  Afterwards, she is able to take her morning pills including mestinon 60mg  without difficulty and feels better.  She feels weaker, similar to when she was overtreated with mestinon.  She is very worried that timespan is not working and I discussed that each individual may have a different response.    OK to stop mestinon timespan and follow.  If she continues to have morning weakness, she will need to take mestinon 60mg  around 3am. She is already on maximal medical therapy with prednisone 60mg , cellcept 500mg  BID (started last week), mestinon 60mg  four times daily, and monthly IVIG.  She is due for her next IVIG dose and we are working with [facility] to coordinate this ASAP.  She denies shortness of breath or ongoing weakness and was reminded to go to the ER immediately for these symptoms.  Suzanne Nguyen K. Posey Pronto, DO

## 2018-02-13 ENCOUNTER — Telehealth: Payer: Self-pay | Admitting: Neurology

## 2018-02-13 NOTE — Telephone Encounter (Signed)
Doctor Kaminsky's office called and they are wanting to set up a peer to peer on this patient. Please Call [phone]. Thanks

## 2018-02-14 ENCOUNTER — Encounter

## 2018-02-14 ENCOUNTER — Ambulatory Visit: Payer: Managed Care, Other (non HMO) | Admitting: Neurology

## 2018-02-14 NOTE — Telephone Encounter (Signed)
I called and they said that Dr. Leontine Locket would like to talk to you about patient's muscle weakness.

## 2018-02-14 NOTE — Telephone Encounter (Signed)
No answer when I called back so message was left on voicemail.

## 2018-02-15 ENCOUNTER — Other Ambulatory Visit: Payer: Self-pay | Admitting: *Deleted

## 2018-02-15 ENCOUNTER — Emergency Department (HOSPITAL_COMMUNITY): Payer: Managed Care, Other (non HMO)

## 2018-02-15 ENCOUNTER — Other Ambulatory Visit (INDEPENDENT_AMBULATORY_CARE_PROVIDER_SITE_OTHER): Payer: Managed Care, Other (non HMO)

## 2018-02-15 ENCOUNTER — Other Ambulatory Visit: Payer: Self-pay

## 2018-02-15 ENCOUNTER — Inpatient Hospital Stay (HOSPITAL_COMMUNITY)
Admission: EM | Admit: 2018-02-15 | Discharge: 2018-02-25 | DRG: 056 | Disposition: A | Discharge status: Home / Self Care | Payer: Managed Care, Other (non HMO) | Attending: Internal Medicine | Admitting: Internal Medicine

## 2018-02-15 ENCOUNTER — Encounter (HOSPITAL_COMMUNITY): Payer: Self-pay | Admitting: *Deleted

## 2018-02-15 DIAGNOSIS — E440 Moderate protein-calorie malnutrition: Secondary | ICD-10-CM | POA: Diagnosis present

## 2018-02-15 DIAGNOSIS — Z79899 Other long term (current) drug therapy: Secondary | ICD-10-CM

## 2018-02-15 DIAGNOSIS — G7000 Myasthenia gravis without (acute) exacerbation: Secondary | ICD-10-CM

## 2018-02-15 DIAGNOSIS — Q892 Congenital malformations of other endocrine glands: Secondary | ICD-10-CM | POA: Diagnosis not present

## 2018-02-15 DIAGNOSIS — I951 Orthostatic hypotension: Secondary | ICD-10-CM | POA: Diagnosis not present

## 2018-02-15 DIAGNOSIS — H547 Unspecified visual loss: Secondary | ICD-10-CM | POA: Diagnosis not present

## 2018-02-15 DIAGNOSIS — Z6832 Body mass index (BMI) 32.0-32.9, adult: Secondary | ICD-10-CM

## 2018-02-15 DIAGNOSIS — E669 Obesity, unspecified: Secondary | ICD-10-CM | POA: Diagnosis present

## 2018-02-15 DIAGNOSIS — G7001 Myasthenia gravis with (acute) exacerbation: Secondary | ICD-10-CM | POA: Diagnosis present

## 2018-02-15 DIAGNOSIS — H02409 Unspecified ptosis of unspecified eyelid: Secondary | ICD-10-CM | POA: Diagnosis present

## 2018-02-15 DIAGNOSIS — R000 Tachycardia, unspecified: Secondary | ICD-10-CM | POA: Diagnosis not present

## 2018-02-15 DIAGNOSIS — Z888 Allergy status to other drugs, medicaments and biological substances status: Secondary | ICD-10-CM

## 2018-02-15 DIAGNOSIS — I10 Essential (primary) hypertension: Secondary | ICD-10-CM | POA: Diagnosis present

## 2018-02-15 DIAGNOSIS — J9600 Acute respiratory failure, unspecified whether with hypoxia or hypercapnia: Secondary | ICD-10-CM | POA: Diagnosis present

## 2018-02-15 DIAGNOSIS — Z9089 Acquired absence of other organs: Secondary | ICD-10-CM

## 2018-02-15 DIAGNOSIS — F419 Anxiety disorder, unspecified: Secondary | ICD-10-CM | POA: Diagnosis present

## 2018-02-15 DIAGNOSIS — E282 Polycystic ovarian syndrome: Secondary | ICD-10-CM | POA: Diagnosis present

## 2018-02-15 DIAGNOSIS — Z7952 Long term (current) use of systemic steroids: Secondary | ICD-10-CM | POA: Diagnosis not present

## 2018-02-15 DIAGNOSIS — H532 Diplopia: Secondary | ICD-10-CM | POA: Diagnosis present

## 2018-02-15 DIAGNOSIS — Z91018 Allergy to other foods: Secondary | ICD-10-CM

## 2018-02-15 DIAGNOSIS — Z8249 Family history of ischemic heart disease and other diseases of the circulatory system: Secondary | ICD-10-CM | POA: Diagnosis not present

## 2018-02-15 DIAGNOSIS — M7989 Other specified soft tissue disorders: Secondary | ICD-10-CM | POA: Diagnosis not present

## 2018-02-15 DIAGNOSIS — R0602 Shortness of breath: Secondary | ICD-10-CM

## 2018-02-15 LAB — COMPREHENSIVE METABOLIC PANEL
ALBUMIN: 4 g/dL (ref 3.5–5.0)
ALK PHOS: 49 U/L (ref 38–126)
ALT: 27 U/L (ref 0–35)
ALT: 38 U/L (ref 0–44)
AST: 28 U/L (ref 0–37)
AST: 43 U/L — ABNORMAL HIGH (ref 15–41)
Albumin: 4.4 g/dL (ref 3.5–5.2)
Alkaline Phosphatase: 44 U/L (ref 39–117)
Anion gap: 10 (ref 5–15)
BILIRUBIN TOTAL: 0.5 mg/dL (ref 0.3–1.2)
BUN: 10 mg/dL (ref 6–23)
BUN: 7 mg/dL (ref 6–20)
CHLORIDE: 102 meq/L (ref 96–112)
CO2: 28 meq/L (ref 19–32)
CO2: 27 mmol/L (ref 22–32)
CREATININE: 0.72 mg/dL (ref 0.40–1.20)
Calcium: 10 mg/dL (ref 8.4–10.5)
Calcium: 9.8 mg/dL (ref 8.9–10.3)
Chloride: 103 mmol/L (ref 98–111)
Creatinine, Ser: 0.69 mg/dL (ref 0.44–1.00)
GFR calc Af Amer: >60 mL/min (ref >60)
GFR calc non Af Amer: >60 mL/min (ref >60)
GFR: 130.86 mL/min (ref >60.00)
GLUCOSE: 183 mg/dL — AB (ref 70–99)
Glucose, Bld: 143 mg/dL — ABNORMAL HIGH (ref 70–99)
POTASSIUM: 4 mmol/L (ref 3.5–5.1)
Potassium: 3.8 mEq/L (ref 3.5–5.1)
Sodium: 140 mEq/L (ref 135–145)
Sodium: 140 mmol/L (ref 135–145)
TOTAL PROTEIN: 6.7 g/dL (ref 6.5–8.1)
Total Bilirubin: 0.4 mg/dL (ref 0.2–1.2)
Total Protein: 7.1 g/dL (ref 6.0–8.3)

## 2018-02-15 LAB — CBC WITH DIFFERENTIAL/PLATELET
ABS IMMATURE GRANULOCYTES: 0 10*3/uL (ref 0.0–0.1)
Basophils Absolute: 0 10*3/uL (ref 0.0–0.1)
Basophils Absolute: 0 10*3/uL (ref 0.0–0.1)
Basophils Relative: 0.4 % (ref 0.0–3.0)
Basophils Relative: 0 %
EOS ABS: 0 10*3/uL (ref 0.0–0.7)
Eosinophils Absolute: 0 10*3/uL (ref 0.0–0.7)
Eosinophils Relative: 0.2 % (ref 0.0–5.0)
Eosinophils Relative: 0 %
HEMATOCRIT: 39.7 % (ref 36.0–46.0)
HEMATOCRIT: 40.8 % (ref 36.0–46.0)
HEMOGLOBIN: 13.2 g/dL (ref 12.0–15.0)
HEMOGLOBIN: 13.2 g/dL (ref 12.0–15.0)
Immature Granulocytes: 0 %
LYMPHS ABS: 1.4 10*3/uL (ref 0.7–4.0)
LYMPHS PCT: 9.2 % — AB (ref 12.0–46.0)
LYMPHS PCT: 14 %
Lymphs Abs: 0.9 10*3/uL (ref 0.7–4.0)
MCH: 29.5 pg (ref 26.0–34.0)
MCHC: 33.3 g/dL (ref 30.0–36.0)
MCHC: 32.4 g/dL (ref 30.0–36.0)
MCV: 88.5 fl (ref 78.0–100.0)
MCV: 91.1 fL (ref 78.0–100.0)
MONO ABS: 0.2 10*3/uL (ref 0.1–1.0)
MONOS PCT: 5 %
Monocytes Absolute: 0.5 10*3/uL (ref 0.1–1.0)
Monocytes Relative: 2.3 % — ABNORMAL LOW (ref 3.0–12.0)
NEUTROS ABS: 7.8 10*3/uL — AB (ref 1.7–7.7)
NEUTROS PCT: 81 %
Neutro Abs: 9 10*3/uL — ABNORMAL HIGH (ref 1.4–7.7)
Neutrophils Relative %: 87.9 % — ABNORMAL HIGH (ref 43.0–77.0)
Platelets: 304 10*3/uL (ref 150.0–400.0)
Platelets: 305 10*3/uL (ref 150–400)
RBC: 4.49 Mil/uL (ref 3.87–5.11)
RBC: 4.48 MIL/uL (ref 3.87–5.11)
RDW: 15.9 % — ABNORMAL HIGH (ref 11.5–15.5)
RDW: 14.4 % (ref 11.5–15.5)
WBC: 10.2 10*3/uL (ref 4.0–10.5)
WBC: 9.7 10*3/uL (ref 4.0–10.5)

## 2018-02-15 LAB — I-STAT BETA HCG BLOOD, ED (MC, WL, AP ONLY)

## 2018-02-15 MED ORDER — SODIUM CHLORIDE 0.9 % IV SOLN: Freq: Once | INTRAVENOUS | Status: DC

## 2018-02-15 MED ORDER — CALCIUM CARBONATE ANTACID 500 MG PO CHEW: 2.0000 | CHEWABLE_TABLET | ORAL | Status: AC

## 2018-02-15 MED ORDER — ACETAMINOPHEN 325 MG PO TABS
650.0000 mg | ORAL_TABLET | Freq: Four times a day (QID) | ORAL | Status: DC | PRN
  Administered 2018-02-16 – 2018-02-23 (×3): 650 mg via ORAL
  Filled 2018-02-15 (×7): qty 2

## 2018-02-15 MED ORDER — MYCOPHENOLATE MOFETIL 250 MG PO CAPS
500.0000 mg | ORAL_CAPSULE | Freq: Two times a day (BID) | ORAL | Status: DC
  Administered 2018-02-15 – 2018-02-25 (×20): 500 mg via ORAL
  Filled 2018-02-15 (×21): qty 2

## 2018-02-15 MED ORDER — IMMUNE GLOBULIN (HUMAN) 5 GM/50ML IV SOLN
400.0000 mg/kg | INTRAVENOUS | Status: DC
  Administered 2018-02-16 (×2): 35 g via INTRAVENOUS
  Filled 2018-02-15 (×3): qty 50

## 2018-02-15 MED ORDER — CALCIUM CARBONATE ANTACID 500 MG PO CHEW
500.0000 mg | CHEWABLE_TABLET | Freq: Two times a day (BID) | ORAL | Status: DC
  Administered 2018-02-16 – 2018-02-25 (×21): 500 mg via ORAL
  Filled 2018-02-15 (×17): qty 3
  Filled 2018-02-15: qty 1
  Filled 2018-02-15 (×2): qty 3

## 2018-02-15 MED ORDER — PYRIDOSTIGMINE BROMIDE 60 MG PO TABS
90.0000 mg | ORAL_TABLET | Freq: Every day | ORAL | Status: DC
  Administered 2018-02-16 – 2018-02-25 (×49): 90 mg via ORAL
  Filled 2018-02-15 (×52): qty 1.5

## 2018-02-15 MED ORDER — DIPHENHYDRAMINE HCL 25 MG PO CAPS
25.0000 mg | ORAL_CAPSULE | Freq: Every day | ORAL | Status: AC
  Administered 2018-02-16: 25 mg via ORAL
  Filled 2018-02-15: qty 1

## 2018-02-15 MED ORDER — SODIUM CHLORIDE 0.9 % IV SOLN: 2.0000 g | Freq: Once | INTRAVENOUS | Status: DC

## 2018-02-15 MED ORDER — ACETAMINOPHEN 650 MG RE SUPP: 650.0000 mg | Freq: Four times a day (QID) | RECTAL | Status: DC | PRN

## 2018-02-15 MED ORDER — PREDNISONE 20 MG PO TABS
60.0000 mg | ORAL_TABLET | Freq: Every day | ORAL | Status: DC
  Administered 2018-02-16 – 2018-02-25 (×10): 60 mg via ORAL
  Filled 2018-02-15 (×12): qty 3

## 2018-02-15 MED ORDER — IOHEXOL 300 MG/ML  SOLN
75.0000 mL | Freq: Once | INTRAMUSCULAR | Status: AC | PRN
  Administered 2018-02-15: 75 mL via INTRAVENOUS

## 2018-02-15 MED ORDER — ACETAMINOPHEN 325 MG PO TABS: 650.0000 mg | ORAL_TABLET | ORAL | Status: DC | PRN

## 2018-02-15 MED ORDER — HEPARIN SODIUM (PORCINE) 1000 UNIT/ML IJ SOLN: 1000.0000 [IU] | Freq: Once | INTRAMUSCULAR | Status: DC

## 2018-02-15 MED ORDER — SODIUM CHLORIDE 0.9 % IV SOLN
INTRAVENOUS | Status: AC
  Administered 2018-02-16 (×2): via INTRAVENOUS

## 2018-02-15 MED ORDER — LORAZEPAM 2 MG/ML IJ SOLN
0.5000 mg | Freq: Once | INTRAMUSCULAR | Status: DC
  Filled 2018-02-15: qty 1

## 2018-02-15 MED ORDER — ADULT MULTIVITAMIN W/MINERALS CH
1.0000 | ORAL_TABLET | Freq: Every day | ORAL | Status: DC
  Administered 2018-02-16 – 2018-02-25 (×10): 1 via ORAL
  Filled 2018-02-15 (×10): qty 1

## 2018-02-15 MED ORDER — DIPHENHYDRAMINE HCL 25 MG PO CAPS: 25.0000 mg | ORAL_CAPSULE | Freq: Four times a day (QID) | ORAL | Status: DC | PRN

## 2018-02-15 MED ORDER — ACD FORMULA A 0.73-2.45-2.2 GM/100ML VI SOLN: 500.0000 mL | Status: DC

## 2018-02-15 NOTE — Telephone Encounter (Signed)
Received call from Dr. Junious Silk at [facility] who cared for patient while she was at home in [place] during the summer break.  He reports that patient did not respond post-thymectomy and in fact became more debilitated by her disease, so she was readmitted for PLEX.  She responded some to PLEX, but still had marked weakness and therefore administered IVIG.  Unfortunately, due to ongoing weakness, prednisone 60mg /d was started.  She was evaluated in the clinic by myself last week and demonstrated bulbar and ocular weakness with intact limb strength.  Due to concern of cholinergic weakness from taking mestinon 120mg  every 4 hours (720mg /d), I started to wean her mestinon to 90mg  four times daily and introduced extended mestinon (timespan) at nighttime to help with morning weakness.  This helped for about 2 days and eliminated side effects of night sweats and cramping.  However, a few days later began having greater difficulty with getting out of bed in the morning, so I instructed her to taking IR mestinon at 3am; in the meantime, my office has been trying to arrange for out-patient IVIG.   Due to progressive generalized and bulbar weakness this week, I have instructed her to go to the ER for admission.  Recommend extended course of PLEX and will request tunneled catheter to continue PLEX as an out-patient, if needed.   Also recommend CT chest w contrast to evaluate for any residual thymoma.   These recommendations have been communicated with neurohospitalist team.  I have spent 35 minutes coordinating care for this patient today.  Narda Amber

## 2018-02-15 NOTE — ED Triage Notes (Signed)
Pt sent in for admission and IVIG, pt has myasthenia gravis and has been experiencing muscle weakness, MD is concerned that she will go into a crisis so she was told to come to ED for IVIG, they have been unable to get this set up outpatient.

## 2018-02-15 NOTE — ED Triage Notes (Signed)
Pt had lab work obtained at MD office just prior to arrival

## 2018-02-15 NOTE — ED Provider Notes (Signed)
[facility] EMERGENCY DEPARTMENT Provider Note   CSN: [id number] Arrival date & time: 02/15/18  1047     History   Chief Complaint Chief Complaint  Patient presents with  . Weakness    HPI Suzanne Nguyen is a 22 y.o. female.  The history is provided by the patient and medical records. No language interpreter was used.   Suzanne Nguyen is a 22 y.o. female  with a PMH of myasthenia gravis who presents to the Emergency Department by recommendation of her outpatient neurologist Dr. Narda Amber.  Patient has been experiencing worsening forearm and lower extremity weakness.  In the mornings, her trunk area feels so weak she is having a hard time getting up.  Her arms were so weak that she is knocking things over and having difficulty lifting her arms up to take her medication.  She has been in communication with her neurologist who would like to have IVIG done, but was unable to arrange this as an outpatient quick enough.  Given that her symptoms are progressively worsening, it was recommended that she come to the hospital for admission to start IVIG. No fever, chills, cough, congestion. Denies pain. Taking her typical medication regimen. No additional meds taken for symptoms.    Past Medical History:  Diagnosis Date  . Bronchitis   . Myasthenia gravis associated with thymoma ([place])   . PCOS (polycystic ovarian syndrome)   . Pneumonia     Patient Active Problem List   Diagnosis Date Noted  . Myasthenia gravis in crisis ([place]) 02/15/2018  . S/P thymectomy 02/08/2018  . Anxiety about health 02/08/2018  . Long term systemic steroid user 02/08/2018  . Myasthenia gravis with exacerbation, generalized ([employer]) 09/25/2017  . 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 Diplopia 08/22/2017  . Obesity (BMI 35.0-39.9 without comorbidity) 08/22/2017  . Polycystic ovarian syndrome 12/27/2015  . Hirsutism 12/24/2015    Past Surgical History:  Procedure Laterality Date  . None        OB History   None      Home Medications    Prior to Admission medications   Medication Sig Start Date End Date Taking? Authorizing Provider  CALCIUM PO Take 600 mg by mouth 2 (two) times daily. With vitamin D3 800 units   Yes [provider]  ibuprofen (ADVIL,MOTRIN) 200 MG tablet Take 200 mg by mouth every 6 (six) hours as needed for mild pain.   Yes [provider]  Multiple Vitamin (MULTIVITAMIN) tablet Take 1 tablet by mouth daily.   Yes [provider]  mycophenolate (CELLCEPT) 500 MG tablet Take 1,000 mg by mouth 2 (two) times daily.    Yes [provider]  predniSONE (DELTASONE) 20 MG tablet Take 2 tablets (40 mg total) by mouth daily with breakfast. Patient taking differently: Take 60 mg by mouth daily with breakfast.  09/25/17  Yes Patel, Donika K, DO  pyridostigmine (MESTINON) 60 MG tablet Take 1-2 tablet four times at 6am, 11am, 3pm, and 8pm. Patient taking differently: Take 60-90 mg by mouth 5 (five) times daily. Take 60 mg -90 mg tablet four times at 6am, 11am, 3pm, 8pm, and 01am 02/08/18  Yes Patel, Donika K, DO  pyridostigmine (MESTINON) 180 MG CR tablet Take 1 tablet (180 mg total) by mouth at bedtime. Patient not taking: Reported on 02/15/2018 02/08/18   Alda Berthold, DO    Family History Family History  Problem Relation Age of Onset  . Gastric cancer Mother   . Hypertension  Mother   . Diabetes Father   . Hypertension Father   . Heart disease Father   . Cancer Maternal Grandmother   . Heart disease Maternal Grandmother   . Lung cancer Maternal Grandfather   . Heart disease Maternal Grandfather   . Stroke Maternal Grandfather   . Diabetes Maternal Grandfather   . Diabetes Paternal Grandmother     Social History Social History   Tobacco Use  . Smoking status: Never Smoker  . Smokeless tobacco: Never Used  Substance Use Topics  . Alcohol use: No  . Drug use: No     Allergies   Magnesium-containing compounds;  Corticosteroids; Other; Procainamide; and Soy allergy   Review of Systems Review of Systems  Neurological: Positive for weakness. Negative for dizziness, syncope, speech difficulty, numbness and headaches.  All other systems reviewed and are negative.    Physical Exam Updated Vital Signs BP 124/71   Pulse 96   Temp 98.3 F (36.8 C) (Oral)   Resp 16   SpO2 98%   Physical Exam  Constitutional: She is oriented to person, place, and time. She appears well-developed and well-nourished. No distress.  HENT:  Head: Normocephalic and atraumatic.  Cardiovascular: Normal rate, regular rhythm and normal heart sounds.  No murmur heard. Pulmonary/Chest: Effort normal and breath sounds normal. No respiratory distress.  Abdominal: Soft. She exhibits no distension. There is no tenderness.  Musculoskeletal: Normal range of motion.  Neurological: She is alert and oriented to person, place, and time.  Skin: Skin is warm and dry.  Nursing note and vitals reviewed.    ED Treatments / Results  Labs (all labs ordered are listed, but only abnormal results are displayed) Labs Reviewed  CBC WITH DIFFERENTIAL/PLATELET - Abnormal; Notable for the following components:      Result Value   Neutro Abs 7.8 (*)    All other components within normal limits  COMPREHENSIVE METABOLIC PANEL - Abnormal; Notable for the following components:   Glucose, Bld 183 (*)    AST 43 (*)    All other components within normal limits  I-STAT BETA HCG BLOOD, ED ([facility], [facility], [facility] ONLY)  I-STAT BETA HCG BLOOD, ED ([facility], [facility], [facility] ONLY)    EKG None  Radiology No results found.  Procedures Procedures (including critical care time)  Medications Ordered in ED Medications  LORazepam (ATIVAN) injection 0.5 mg (0 mg Intravenous Hold 02/15/18 2050)  iohexol (OMNIPAQUE) 300 MG/ML solution 75 mL (75 mLs Intravenous Contrast Given 02/15/18 1905)     Initial Impression / Assessment and Plan / ED Course  I have reviewed the triage  vital signs and the nursing notes.  Pertinent labs & imaging results that were available during my care of the patient were reviewed by me and considered in my medical decision making (see chart for details).    Suzanne Nguyen is a 22 y.o. female who presents to ED by recommendation of her outpatient neurologist, Dr. Narda Amber, for admission for IVIG due to worsening myasthenia gravis symptoms. Labs reviewed and reassuring. Seen by neurology, Dr. Rory Percy, while in ED today. Appreciate neuro assistance with care today. Please see his note for full recommendations. Recommends admission to hospitalist and IR consult in the morning to get tunneled catheter. Hospitalist consulted who will admit.     Final Clinical Impressions(s) / ED Diagnoses   Final diagnoses:  Myasthenia gravis in crisis [facility])    ED Discharge Orders    None       Ward, Ozella Almond,  PA-C 02/15/18 2151    Lacretia Leigh, MD 02/17/18 [phone]

## 2018-02-15 NOTE — H&P (Signed)
History and Physical    Suzanne Nguyen YTK:[id number] DOB: 08/03/1995 DOA: 02/15/2018  PCP: Patient, No Pcp Per  Patient coming from: Home.  Chief Complaint: Weakness.  HPI: Suzanne Nguyen is a 22 y.o. female with history of seropositive myasthenia gravis diagnosed in March 2019 has had thymectomy done in July 2019 when patient was in [place] course was complicated with worsening of her symptoms and had to have plasmapheresis has come back to [place] to attend school and has been having some weakness over the last few days particularly in the extremities neck area and ocular area.  Patient was recently started on CellCept by patient's primary neurologist and patient is already on Mestinon and prednisone.  Patient denies any difficulty swallowing all problems breathing.  Denies any headache visual symptoms fever or chills.  ED Course: In the ER patient was evaluated by the neurologist and at this time plan was to place patient on plasmapheresis and also for now to continue with home medications.  Patient be admitted to stepdown unit for close monitoring of her respiratory status.  Review of Systems: As per HPI, rest all negative.   Past Medical History:  Diagnosis Date  . Bronchitis   . Myasthenia gravis associated with thymoma ([place])   . PCOS (polycystic ovarian syndrome)   . Pneumonia     Past Surgical History:  Procedure Laterality Date  . None       reports that she has never smoked. She has never used smokeless tobacco. She reports that she does not drink alcohol or use drugs.  Allergies  Allergen Reactions  . Magnesium-Containing Compounds Anaphylaxis    paralysis  . Corticosteroids     Worsen symptoms of MYASTHENIA GRAVES  . Other     Mycins  . Procainamide     Irregular Heart beat  . Soy Allergy Itching    Family History  Problem Relation Age of Onset  . Gastric cancer Mother   . Hypertension Mother   . Diabetes Father   . Hypertension  Father   . Heart disease Father   . Cancer Maternal Grandmother   . Heart disease Maternal Grandmother   . Lung cancer Maternal Grandfather   . Heart disease Maternal Grandfather   . Stroke Maternal Grandfather   . Diabetes Maternal Grandfather   . Diabetes Paternal Grandmother     Prior to Admission medications   Medication Sig Start Date End Date Taking? Authorizing Provider  CALCIUM PO Take 600 mg by mouth 2 (two) times daily. With vitamin D3 800 units   Yes [provider]  ibuprofen (ADVIL,MOTRIN) 200 MG tablet Take 200 mg by mouth every 6 (six) hours as needed for mild pain.   Yes [provider]  Multiple Vitamin (MULTIVITAMIN) tablet Take 1 tablet by mouth daily.   Yes [provider]  mycophenolate (CELLCEPT) 500 MG tablet Take 1,000 mg by mouth 2 (two) times daily.    Yes [provider]  predniSONE (DELTASONE) 20 MG tablet Take 2 tablets (40 mg total) by mouth daily with breakfast. Patient taking differently: Take 60 mg by mouth daily with breakfast.  09/25/17  Yes Patel, Donika K, DO  pyridostigmine (MESTINON) 60 MG tablet Take 1-2 tablet four times at 6am, 11am, 3pm, and 8pm. Patient taking differently: Take 60-90 mg by mouth 5 (five) times daily. Take 60 mg -90 mg tablet four times at 6am, 11am, 3pm, 8pm, and 01am 02/08/18  Yes Patel, Donika K, DO  pyridostigmine (MESTINON) 180 MG  CR tablet Take 1 tablet (180 mg total) by mouth at bedtime. Patient not taking: Reported on 02/15/2018 02/08/18   Alda Berthold, DO    Physical Exam: Vitals:   02/15/18 1830 02/15/18 1925 02/15/18 1930 02/15/18 2000  BP: (!) 118/101 (!) 132/100 130/67 124/71  Pulse: 80 (!) 101 90 96  Resp:  16  16  Temp:  98.3 F (36.8 C)    TempSrc:  Oral    SpO2: 98% 98% 98% 98%      Constitutional: Moderately built and nourished. Vitals:   02/15/18 1830 02/15/18 1925 02/15/18 1930 02/15/18 2000  BP: (!) 118/101 (!) 132/100 130/67 124/71  Pulse: 80 (!) 101 90 96    Resp:  16  16  Temp:  98.3 F (36.8 C)    TempSrc:  Oral    SpO2: 98% 98% 98% 98%   Eyes: Anicteric no pallor. ENMT: No discharge from the ears eyes nose or mouth. Neck: No mass felt.  No neck rigidity.  No JVD appreciated. Respiratory: No rhonchi or crepitations. Cardiovascular: S1-S2 heard no murmurs appreciated. Abdomen: Soft nontender bowel sounds present. Musculoskeletal: No edema.  No joint effusion. Skin: No rash.  Skin appears warm. Neurologic: Alert awake oriented to time place and person.  Moves all extremities. Psychiatric: Appears normal per normal affect.   Labs on Admission: I have personally reviewed following labs and imaging studies  CBC: Recent Labs  Lab 02/15/18 1025 02/15/18 1656  WBC 10.2 9.7  NEUTROABS 9.0* 7.8*  HGB 13.2 13.2  HCT 39.7 40.8  MCV 88.5 91.1  PLT 304.0 093   Basic Metabolic Panel: Recent Labs  Lab 02/15/18 1025 02/15/18 1656  NA 140 140  K 3.8 4.0  CL 102 103  CO2 28 27  GLUCOSE 143* 183*  BUN 10 7  CREATININE 0.72 0.69  CALCIUM 10.0 9.8   GFR: Estimated Creatinine Clearance: 125.7 mL/min (by C-G formula based on SCr of 0.69 mg/dL). Liver Function Tests: Recent Labs  Lab 02/15/18 1025 02/15/18 1656  AST 28 43*  ALT 27 38  ALKPHOS 44 49  BILITOT 0.4 0.5  PROT 7.1 6.7  ALBUMIN 4.4 4.0   No results for input(s): LIPASE, AMYLASE in the last 168 hours. No results for input(s): AMMONIA in the last 168 hours. Coagulation Profile: No results for input(s): INR, PROTIME in the last 168 hours. Cardiac Enzymes: No results for input(s): CKTOTAL, CKMB, CKMBINDEX, TROPONINI in the last 168 hours. BNP (last 3 results) No results for input(s): PROBNP in the last 8760 hours. HbA1C: No results for input(s): HGBA1C in the last 72 hours. CBG: No results for input(s): GLUCAP in the last 168 hours. Lipid Profile: No results for input(s): CHOL, HDL, LDLCALC, TRIG, CHOLHDL, LDLDIRECT in the last 72 hours. Thyroid Function  Tests: No results for input(s): TSH, T4TOTAL, FREET4, T3FREE, THYROIDAB in the last 72 hours. Anemia Panel: No results for input(s): VITAMINB12, FOLATE, FERRITIN, TIBC, IRON, RETICCTPCT in the last 72 hours. Urine analysis:    Component Value Date/Time   COLORURINE STRAW (A) 08/21/2017 1800   APPEARANCEUR CLEAR 08/21/2017 1800   LABSPEC 1.005 08/21/2017 1800   PHURINE 6.0 08/21/2017 1800   GLUCOSEU NEGATIVE 08/21/2017 1800   HGBUR NEGATIVE 08/21/2017 1800   BILIRUBINUR NEGATIVE 08/21/2017 1800   KETONESUR NEGATIVE 08/21/2017 1800   PROTEINUR NEGATIVE 08/21/2017 1800   NITRITE NEGATIVE 08/21/2017 1800   LEUKOCYTESUR NEGATIVE 08/21/2017 1800   Sepsis Labs: @LABRCNTIP (procalcitonin:4,lacticidven:4) )No results found for this or any previous visit (from  the past 240 hour(s)).   Radiological Exams on Admission: No results found.   Assessment/Plan Principal Problem:   Myasthenia gravis in crisis [facility]) Active Problems:   Polycystic ovarian syndrome   S/P thymectomy    1. Myasthenia gravis in crisis -patient is on Mestinon 90 mg 5 times daily CellCept 500 mg p.o. twice daily prednisone 60 mg p.o. daily and plan is to have plasmapheresis done from Monday on February 18, 2018.  Patient has requested IVIG and I discussed with on-call neurologist Dr. Cheral Marker who advised to dose IVIG 0.4 g/kg daily for the next 5 days.  Discussed with patient about the adverse reaction with IVIG which patient states she is aware including headaches anaphylactic reaction and increased chance of clots.  Patient's NIF and vital capacity will be monitored every 8 hours.  Will consult critical care if there is any worsening.  Monitor and stepdown unit closely.  Interventional radiology has been consulted for tunneled catheter placement for plasmapheresis. 2. History of polycystic ovarian syndrome presently not on medication.   DVT prophylaxis: SCDs in anticipation of possible procedure. Code Status: Full  code. Family Communication: Discussed with patient. Disposition Plan: Home. Consults called: Neurologist and critical care. Admission status: Inpatient.   Rise Patience MD [facility] Pager [phone].  If 7PM-7AM, please contact night-coverage [email or web address] Password [facility]  02/15/2018, 10:09 PM

## 2018-02-15 NOTE — Consult Note (Addendum)
Neurology Consultation  Reason for Consult: Myasthenia gravis flareup Referring Physician: Dr. Allen/Dr. Posey Pronto  CC: Generalized weakness, double vision  History is obtained from: Patient, chart, patient's neurologist  HPI: Suzanne Nguyen is a 22 y.o. female past medical history of diagnosis of seropositive myasthenia gravis in March 2019, seen at our facility and diagnosis made her facility, status post thymectomy done at July 8242, complicated course post thymectomy with more worsening rather than improvement. She is coming in today for evaluation of ongoing worsening weakness. According to her, ever since the thymectomy, she has received multiple doses of IVIG and multiple rounds of plasma exchange for ongoing weakness. She has returned back to the area to go to school and has experienced more weakness in the past 1 week especially in her extremities as well as ocular weakness. She is also been reporting increasing shortness of breath and requiring more pillows to sleep at night to avoid shortness of breath as well as being more short of breath while walking shorter distances. She called Dr. Serita Grit office and has seen Dr. Posey Pronto in the last week. I spoke in detail with Dr. Posey Pronto who was recommended that she be admitted for plasma exchange as she has had a conversation with her neurologist from [place], who has relayed to Dr. Posey Pronto that the patient has not really had a good response to IVIG post thymectomy and would like to do at least 5 rounds of plasma exchange in-house as well as may be follow this with long-term plasma exchange. Her postoperative course was also complicated by high doses of Mestinon, which Dr. Posey Pronto as an outpatient is also going down on at this time.   ROS: ROS was performed and is negative except as noted in the HPI.    Past Medical History:  Diagnosis Date  . Bronchitis   . Myasthenia gravis associated with thymoma ([place])   . PCOS (polycystic ovarian  syndrome)   . Pneumonia     Family History  Problem Relation Age of Onset  . Gastric cancer Mother   . Hypertension Mother   . Diabetes Father   . Hypertension Father   . Heart disease Father   . Cancer Maternal Grandmother   . Heart disease Maternal Grandmother   . Lung cancer Maternal Grandfather   . Heart disease Maternal Grandfather   . Stroke Maternal Grandfather   . Diabetes Maternal Grandfather   . Diabetes Paternal Grandmother     Social History:   reports that she has never smoked. She has never used smokeless tobacco. She reports that she does not drink alcohol or use drugs.  Medications No current facility-administered medications for this encounter.   Current Outpatient Medications:  .  CALCIUM PO, Take 600 mg by mouth 2 (two) times daily. With vitamin D3 800 units, Disp: , Rfl:  .  ibuprofen (ADVIL,MOTRIN) 200 MG tablet, Take 200 mg by mouth every 6 (six) hours as needed for mild pain., Disp: , Rfl:  .  Multiple Vitamin (MULTIVITAMIN) tablet, Take 1 tablet by mouth daily., Disp: , Rfl:  .  mycophenolate (CELLCEPT) 500 MG tablet, Take 1,000 mg by mouth 2 (two) times daily. , Disp: , Rfl:  .  predniSONE (DELTASONE) 20 MG tablet, Take 2 tablets (40 mg total) by mouth daily with breakfast. (Patient taking differently: Take 60 mg by mouth daily with breakfast. ), Disp: 60 tablet, Rfl: 3 .  pyridostigmine (MESTINON) 60 MG tablet, Take 1-2 tablet four times at 6am, 11am, 3pm, and 8pm. (  Patient taking differently: Take 60-90 mg by mouth 5 (five) times daily. Take 60 mg -90 mg tablet four times at 6am, 11am, 3pm, 8pm, and 01am), Disp: 90 tablet, Rfl: 5 .  pyridostigmine (MESTINON) 180 MG CR tablet, Take 1 tablet (180 mg total) by mouth at bedtime. (Patient not taking: Reported on 02/15/2018), Disp: 30 tablet, Rfl: 5  Exam: Current vital signs: BP (!) 134/93   Pulse (!) 107   Temp 98 F (36.7 C) (Oral)   Resp 16   SpO2 99%  Vital signs in last 24 hours: Temp:  [98 F  (36.7 C)] 98 F (36.7 C) (09/13 1054) Pulse Rate:  [101-113] 107 (09/13 1630) Resp:  [16-20] 16 (09/13 1628) BP: (130-158)/(82-101) 134/93 (09/13 1630) SpO2:  [99 %-100 %] 99 % (09/13 1630)  GENERAL: Awake, alert in NAD, does not appear very tired HEENT: - Normocephalic and atraumatic, dry mm, no LN++, no Thyromegally some facial puffiness LUNGS - Clear to auscultation bilaterally with no wheezes CV - S1S2 RRR, no m/r/g, equal pulses bilaterally. ABDOMEN - Soft, nontender, nondistended with normoactive BS Ext: warm, well perfused, intact peripheral pulses, no edema  NEURO:  Mental Status: AA&Ox3  Language: speech is clear.  Naming, repetition, fluency, and comprehension intact. Cranial Nerves: PERRL.  Gazes disconjugate with right eye unable to completely Adduct and significant diplopia on leftward gaze.visual fields full, bilateral ptosis that is mild, no facial asymmetry, facial sensation intact, hearing intact, tongue/uvula/soft palate midline, normal sternocleidomastoid and trapezius muscle strength. No evidence of tongue atrophy or fibrillations Motor: Antigravity in upper extremity with 4+5 fatigable symmetric, lower extremities 4-/5 with fatigability.  Neck flexion 5 x 5.  Different fatigability on exam Tone: is normal and bulk is normal Sensation- Intact to light touch bilaterally Coordination: FTN intact bilaterally, no ataxia in BLE. Gait- deferred DTRs evenly brisk all over. Gait testing deferred at this time  Labs I have reviewed labs in epic and the results pertinent to this consultation are:  CBC    Component Value Date/Time   WBC 9.7 02/15/2018 1656   RBC 4.48 02/15/2018 1656   HGB 13.2 02/15/2018 1656   HCT 40.8 02/15/2018 1656   PLT 305 02/15/2018 1656   MCV 91.1 02/15/2018 1656   MCH 29.5 02/15/2018 1656   MCHC 32.4 02/15/2018 1656   RDW 14.4 02/15/2018 1656   LYMPHSABS 1.4 02/15/2018 1656   MONOABS 0.5 02/15/2018 1656   EOSABS 0.0 02/15/2018 1656    BASOSABS 0.0 02/15/2018 1656   CMP     Component Value Date/Time   NA 140 02/15/2018 1656   K 4.0 02/15/2018 1656   CL 103 02/15/2018 1656   CO2 27 02/15/2018 1656   GLUCOSE 183 (H) 02/15/2018 1656   BUN 7 02/15/2018 1656   CREATININE 0.69 02/15/2018 1656   CALCIUM 9.8 02/15/2018 1656   PROT 6.7 02/15/2018 1656   ALBUMIN 4.0 02/15/2018 1656   AST 43 (H) 02/15/2018 1656   ALT 38 02/15/2018 1656   ALKPHOS 49 02/15/2018 1656   BILITOT 0.5 02/15/2018 1656   GFRNONAA >60 02/15/2018 1656   GFRAA >60 02/15/2018 1656   Imaging-no imaging indicated at this time except for chest imaging which I would recommend below  Assessment:  Seropositive myasthenia gravis status post thymectomy with possible myasthenic crisis at this time. Initial diagnosis March 2019, thymectomy July 2019, multiple doses of IVIG and plasma exchange since the thymectomy with minimal improvement in ongoing weakness in multiple areas including ocular muscles and bulbar muscles.  Impression: MG crisis  Recommendations: Admit to hospitalist on stepdown NIF and FVC's every 8 hours Continue with prednisone 60 mg daily for now Continue with CellCept 500 twice daily for now Mestinon 120 mg 4 times daily-at 8 AM, 12 PM, 4 PM and 8 PM. Monitor for cholinergic side effects-diarrhea, increased secretions. IR consult for tunneled catheter for plasma exchange Plasma exchange starting Monday at the latest- total of 5 rounds every other day. PLEX can be a backup if worsens clinically Discussed the patient's clinical presentation and plan with her outpatient neurologist Dr. Posey Pronto  -- Amie Portland, MD [facility] Pager: [phone] If 7pm to 7am, please call on call as listed on AMION.

## 2018-02-16 ENCOUNTER — Inpatient Hospital Stay (HOSPITAL_COMMUNITY): Payer: Managed Care, Other (non HMO)

## 2018-02-16 DIAGNOSIS — J9600 Acute respiratory failure, unspecified whether with hypoxia or hypercapnia: Secondary | ICD-10-CM

## 2018-02-16 DIAGNOSIS — Z9089 Acquired absence of other organs: Secondary | ICD-10-CM

## 2018-02-16 DIAGNOSIS — E440 Moderate protein-calorie malnutrition: Secondary | ICD-10-CM

## 2018-02-16 DIAGNOSIS — E282 Polycystic ovarian syndrome: Secondary | ICD-10-CM

## 2018-02-16 DIAGNOSIS — G7001 Myasthenia gravis with (acute) exacerbation: Principal | ICD-10-CM

## 2018-02-16 LAB — CBC WITH DIFFERENTIAL/PLATELET
Abs Immature Granulocytes: 0 10*3/uL (ref 0.0–0.1)
BASOS PCT: 0 %
Basophils Absolute: 0 10*3/uL (ref 0.0–0.1)
EOS ABS: 0.1 10*3/uL (ref 0.0–0.7)
Eosinophils Relative: 1 %
HCT: 37.2 % (ref 36.0–46.0)
Hemoglobin: 11.9 g/dL — ABNORMAL LOW (ref 12.0–15.0)
Immature Granulocytes: 0 %
Lymphocytes Relative: 43 %
Lymphs Abs: 2.5 10*3/uL (ref 0.7–4.0)
MCH: 29.2 pg (ref 26.0–34.0)
MCHC: 32 g/dL (ref 30.0–36.0)
MCV: 91.4 fL (ref 78.0–100.0)
MONO ABS: 0.1 10*3/uL (ref 0.1–1.0)
MONOS PCT: 1 %
Neutro Abs: 3.2 10*3/uL (ref 1.7–7.7)
Neutrophils Relative %: 55 %
PLATELETS: 276 10*3/uL (ref 150–400)
RBC: 4.07 MIL/uL (ref 3.87–5.11)
RDW: 14.4 % (ref 11.5–15.5)
WBC: 5.8 10*3/uL (ref 4.0–10.5)

## 2018-02-16 LAB — BLOOD GAS, ARTERIAL
ACID-BASE EXCESS: 2.1 mmol/L — AB (ref 0.0–2.0)
Bicarbonate: 26.2 mmol/L (ref 20.0–28.0)
DRAWN BY: 39899
FIO2: 21
O2 SAT: 95.9 %
Patient temperature: 98.6
pCO2 arterial: 41.2 mmHg (ref 32.0–48.0)
pH, Arterial: 7.419 (ref 7.350–7.450)
pO2, Arterial: 81.7 mmHg — ABNORMAL LOW (ref 83.0–108.0)

## 2018-02-16 LAB — BASIC METABOLIC PANEL
ANION GAP: 10 (ref 5–15)
BUN: 8 mg/dL (ref 6–20)
CO2: 28 mmol/L (ref 22–32)
Calcium: 9.5 mg/dL (ref 8.9–10.3)
Chloride: 100 mmol/L (ref 98–111)
Creatinine, Ser: 0.67 mg/dL (ref 0.44–1.00)
GFR calc Af Amer: >60 mL/min (ref >60)
GFR calc non Af Amer: >60 mL/min (ref >60)
GLUCOSE: 81 mg/dL (ref 70–99)
POTASSIUM: 3.5 mmol/L (ref 3.5–5.1)
Sodium: 138 mmol/L (ref 135–145)

## 2018-02-16 LAB — HEPATIC FUNCTION PANEL
ALT: 29 U/L (ref 0–44)
AST: 32 U/L (ref 15–41)
Albumin: 3.6 g/dL (ref 3.5–5.0)
Alkaline Phosphatase: 38 U/L (ref 38–126)
BILIRUBIN DIRECT: 0.1 mg/dL (ref 0.0–0.2)
BILIRUBIN TOTAL: 0.5 mg/dL (ref 0.3–1.2)
Indirect Bilirubin: 0.4 mg/dL (ref 0.3–0.9)
Total Protein: 6.8 g/dL (ref 6.5–8.1)

## 2018-02-16 LAB — PROTIME-INR
INR: 1.01
PROTHROMBIN TIME: 13.2 s (ref 11.4–15.2)

## 2018-02-16 LAB — MRSA PCR SCREENING: MRSA by PCR: NEGATIVE

## 2018-02-16 LAB — TSH: TSH: 1.843 u[IU]/mL (ref 0.350–4.500)

## 2018-02-16 MED ORDER — METOCLOPRAMIDE HCL 5 MG/ML IJ SOLN
10.0000 mg | Freq: Once | INTRAMUSCULAR | Status: AC
  Administered 2018-02-16: 10 mg via INTRAVENOUS
  Filled 2018-02-16: qty 2

## 2018-02-16 MED ORDER — BOOST / RESOURCE BREEZE PO LIQD CUSTOM
1.0000 | Freq: Three times a day (TID) | ORAL | Status: DC
  Administered 2018-02-16 – 2018-02-25 (×23): 1 via ORAL

## 2018-02-16 MED ORDER — KETOROLAC TROMETHAMINE 30 MG/ML IJ SOLN
30.0000 mg | Freq: Once | INTRAMUSCULAR | Status: AC
  Administered 2018-02-16: 30 mg via INTRAVENOUS
  Filled 2018-02-16: qty 1

## 2018-02-16 MED ORDER — DIPHENHYDRAMINE HCL 50 MG/ML IJ SOLN
25.0000 mg | Freq: Once | INTRAMUSCULAR | Status: AC
  Administered 2018-02-16: 25 mg via INTRAVENOUS
  Filled 2018-02-16: qty 1

## 2018-02-16 NOTE — Progress Notes (Signed)
Initial Nutrition Assessment  DOCUMENTATION CODES:   Obesity unspecified, Non-severe (moderate) malnutrition in context of chronic illness  INTERVENTION:   - Boost Breeze po TID, each supplement provides 250 kcal and 9 grams of protein  - Continue MVI with minerals daily  - Encourage adequate PO intake  NUTRITION DIAGNOSIS:   Moderate Malnutrition related to chronic illness (myasthenia gravis) as evidenced by mild muscle depletion, percent weight loss (9% weight loss in 4 months).  GOAL:   Patient will meet greater than or equal to 90% of their needs  MONITOR:   PO intake, Supplement acceptance, Weight trends, Labs  REASON FOR ASSESSMENT:   Malnutrition Screening Tool    ASSESSMENT:   22 year old female who presented to the ED on 9/13 with weakness by recommendation of outpatient neurologist. [place] significant for myasthenia gravis s/p thymectomy in July 2019.  IR has been consulted for tunneled catheter placement for plasmapheresis. Tentatively scheduled for tomorrow.  Spoke with pt at bedside. Pt was resting in the recliner with NT in room.  Pt states that her chewing has been affected for a couple of months due to weakness. Pt states that during this time, she ate softer foods and consumed more liquids including whey protein supplements, soup, and potatoes.  Pt shares that she is allergic to soy but is willing to try oral nutrition supplements without soy. RD to order [facility].   Pt reports being hungry at time of visit. RD provide d fruit cups. Pt states she didn't like her breakfast but was able to eat the bacon, eggs, and some grits.  Pt shares that she has been losing weight since April/May 2019. Pt reports her UBW as 230 lbs since she started college. Per weight history in chart, pt has lost 20 lbs in the past 4 months. This is a 9% weight loss which is significant for timeframe.  Medications reviewed and include: MVI with minerals daily  Labs reviewed:  hemoglobin 11.9 (L)  NUTRITION - FOCUSED PHYSICAL EXAM:    Most Recent Value  Orbital Region  No depletion  Upper Arm Region  No depletion  Thoracic and Lumbar Region  No depletion  Buccal Region  Mild depletion  Temple Region  No depletion  Clavicle Bone Region  No depletion  Clavicle and Acromion Bone Region  Mild depletion  Scapular Bone Region  No depletion  Dorsal Hand  Mild depletion  Patellar Region  Mild depletion  Anterior Thigh Region  Mild depletion  Posterior Calf Region  No depletion  Edema (RD Assessment)  None  Hair  Reviewed  Eyes  Reviewed  Mouth  Reviewed  Skin  Reviewed  Nails  Reviewed       Diet Order:   Diet Order            Diet NPO time specified  Diet effective midnight        Diet regular Room service appropriate? Yes; Fluid consistency: Thin  Diet effective now              EDUCATION NEEDS:   No education needs have been identified at this time  Skin:  Skin Assessment: Reviewed RN Assessment  Last BM:  PTA  Height:   Ht Readings from Last 1 Encounters:  02/15/18 5\' 6"  (1.676 m)    Weight:   Wt Readings from Last 1 Encounters:  02/16/18 91 kg    Ideal Body Weight:  59.09 kg  BMI:  Body mass index is 32.38 kg/m.  Estimated Nutritional  Needs:   Kcal:  2000-2200  Protein:  100-115 grams  Fluid:  2.0-2.2 L    Gaynell Face, MS, RD, LDN Pager: [phone] Weekend/After Hours: [phone]

## 2018-02-16 NOTE — Progress Notes (Signed)
RT NOTE:  NIF: -36 VC: 0.75 L  With good patient effort.

## 2018-02-16 NOTE — Progress Notes (Signed)
RT NOTE:   NIF: -38 VC: 0.45L   Best out of 3 and with good patient effort.

## 2018-02-16 NOTE — Plan of Care (Signed)
  Problem: Clinical Measurements: Goal: Ability to maintain clinical measurements within normal limits will improve Outcome: Progressing   Problem: Activity: Goal: Risk for activity intolerance will decrease Outcome: Progressing   Problem: Pain Managment: Goal: General experience of comfort will improve Outcome: Progressing   

## 2018-02-16 NOTE — Progress Notes (Signed)
PROGRESS NOTE  Suzanne Nguyen  KVQ:[id number] DOB: 08-29-1995 DOA: 02/15/2018 PCP: Patient, No Pcp Per  Outpatient Specialists: Neurology, Posey Pronto Brief Narrative: Suzanne Nguyen is a 22 y.o. female with a history of myasthenia gravis Dx March 2019 s/p thymectomy July 2019. 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 She presented with progressive weakness over the previous few days in the extremities, easy fatigue with chewing and double vision. She was admitted with plans for plasma exchange following tunneled catheter placement per neurology. PCCM was consulted, but she has remained stable under close observation in the step down unit.   Assessment & Plan: Principal Problem:   Myasthenia gravis in crisis [facility]) Active Problems:   Polycystic ovarian syndrome   S/P thymectomy   Acute respiratory failure (HCC)   Malnutrition of moderate degree  Myasthenia gravis with myasthenic crisis: Symptoms oddly worsened following thymectomy.  - Appreciate neurology driving care for this patient. Plan for tunneled catheter placement by IR today vs. tomorrow and starting PLEX 9/16. As temporizing measure, though she's had limited response in the past, will give IVIG. - Monitor NIF and FVC q8h. Vital capacity of 425ml is about 7.17ml/kg IBW which is severely diminished and would suggest worse respiratory compromise than the patient is currently exhibiting. NIF -38. Check for hypercarbia with ABG. Never required intubation. - Mestinon 90mg  5x/day - CellCept 500mg  BID - Prednisone 60mg  daily  Obesity: Body mass index is 32.38 kg/m.   PCOS: Noted, quiescent.  DVT prophylaxis: SCDs Code Status: Full Family Communication: None at bedside. Called mother with no answer. Disposition Plan: Remain inpatient pending clinical progress  Consultants:   PCCM  Neurology  IR  Procedures:   None  Antimicrobials:  None   Subjective: Feels better  than admission. No dyspnea but does get short of breath when laying supine. Weakness in jaw and arms improving. Worst weakness is in forearms.   Objective: Vitals:   02/16/18 0335 02/16/18 0500 02/16/18 0855 02/16/18 1117  BP: (!) 147/97  133/76 (!) 139/93  Pulse: (!) 114  (!) 104 (!) 112  Resp: (!) 21  (!) 21 (!) 27  Temp: 98.1 F (36.7 C)  97.8 F (36.6 C) 97.6 F (36.4 C)  TempSrc: Oral  Oral Oral  SpO2: 98%  91% 99%  Weight:  91 kg    Height:        Intake/Output Summary (Last 24 hours) at 02/16/2018 1509 Last data filed at 02/16/2018 0950 Gross per 24 hour  Intake 796.64 ml  Output -  Net 796.64 ml   Filed Weights   02/15/18 2300 02/16/18 0500  Weight: 91 kg 91 kg   Gen: Pleasant, conversant obese female in no distress Pulm: Non-labored breathing room air. Clear to auscultation bilaterally. Equal, good excursions.  CV: Regular rate and rhythm. No murmur, rub, or gallop. No JVD, no pedal edema. GI: Abdomen soft, non-tender, non-distended, with normoactive bowel sounds. No organomegaly or masses felt. Ext: Warm, no deformities Skin: No rashes, lesions or ulcers Neuro: Alert and oriented. Ptosis. Rapid fatigue on exam of shoulder abductors and knee flexors. Right eye fails to cross midline on left lateral gaze. No focal neurological deficits. Psych: Judgement and insight appear normal. Mood & affect appropriate.   Data Reviewed: I have personally reviewed following labs and imaging studies  CBC: Recent Labs  Lab 02/15/18 1025 02/15/18 1656 02/16/18 0304  WBC 10.2 9.7 5.8  NEUTROABS 9.0* 7.8* 3.2  HGB 13.2 13.2  11.9*  HCT 39.7 40.8 37.2  MCV 88.5 91.1 91.4  PLT 304.0 305 102   Basic Metabolic Panel: Recent Labs  Lab 02/15/18 1025 02/15/18 1656 02/16/18 0304  NA 140 140 138  K 3.8 4.0 3.5  CL 102 103 100  CO2 28 27 28   GLUCOSE 143* 183* 81  BUN 10 7 8   CREATININE 0.72 0.69 0.67  CALCIUM 10.0 9.8 9.5   GFR: Estimated Creatinine Clearance: 126.4 mL/min  (by C-G formula based on SCr of 0.67 mg/dL). Liver Function Tests: Recent Labs  Lab 02/15/18 1025 02/15/18 1656 02/16/18 0304  AST 28 43* 32  ALT 27 38 29  ALKPHOS 44 49 38  BILITOT 0.4 0.5 0.5  PROT 7.1 6.7 6.8  ALBUMIN 4.4 4.0 3.6   No results for input(s): LIPASE, AMYLASE in the last 168 hours. No results for input(s): AMMONIA in the last 168 hours. Coagulation Profile: Recent Labs  Lab 02/16/18 0902  INR 1.01   Cardiac Enzymes: No results for input(s): CKTOTAL, CKMB, CKMBINDEX, TROPONINI in the last 168 hours. BNP (last 3 results) No results for input(s): PROBNP in the last 8760 hours. HbA1C: No results for input(s): HGBA1C in the last 72 hours. CBG: No results for input(s): GLUCAP in the last 168 hours. Lipid Profile: No results for input(s): CHOL, HDL, LDLCALC, TRIG, CHOLHDL, LDLDIRECT in the last 72 hours. Thyroid Function Tests: Recent Labs    02/16/18 0304  TSH 1.843   Anemia Panel: No results for input(s): VITAMINB12, FOLATE, FERRITIN, TIBC, IRON, RETICCTPCT in the last 72 hours. Urine analysis:    Component Value Date/Time   COLORURINE STRAW (A) 08/21/2017 1800   APPEARANCEUR CLEAR 08/21/2017 1800   LABSPEC 1.005 08/21/2017 1800   PHURINE 6.0 08/21/2017 1800   GLUCOSEU NEGATIVE 08/21/2017 1800   HGBUR NEGATIVE 08/21/2017 1800   BILIRUBINUR NEGATIVE 08/21/2017 1800   KETONESUR NEGATIVE 08/21/2017 1800   PROTEINUR NEGATIVE 08/21/2017 1800   NITRITE NEGATIVE 08/21/2017 1800   LEUKOCYTESUR NEGATIVE 08/21/2017 1800   Recent Results (from the past 240 hour(s))  MRSA PCR Screening     Status: None   Collection Time: 02/15/18 11:28 PM  Result Value Ref Range Status   MRSA by PCR NEGATIVE NEGATIVE Final    Comment:        The GeneXpert MRSA Assay (FDA approved for NASAL specimens only), is one component of a comprehensive MRSA colonization surveillance program. It is not intended to diagnose MRSA infection nor to guide or monitor treatment  for MRSA infections. Performed at [facility], [place] [address], [place],  [address]       Radiology Studies: Dg Chest Port 1 View  Result Date: 02/16/2018 CLINICAL DATA:  Shortness of breath.  History of bronchitis. EXAM: PORTABLE CHEST 1 VIEW COMPARISON:  None. FINDINGS: Cardiomediastinal silhouette is unremarkable for this low inspiratory examination with crowded vasculature markings. Elevated LEFT hemidiaphragm. The lungs are clear without pleural effusions or focal consolidations. Trachea projects midline and there is no pneumothorax. Included soft tissue planes and osseous structures are non-suspicious. IMPRESSION: 1. No acute cardiopulmonary process. 2. Elevated LEFT hemidiaphragm. Electronically Signed   By: Elon Alas M.D.   On: 02/16/2018 06:37    Scheduled Meds: . calcium carbonate  500 mg Oral BID  . diphenhydrAMINE  25 mg Oral Daily  . feeding supplement  1 Container Oral TID BM  . heparin  1,000 Units Intracatheter Once  . Immune Globulin 10%  400 mg/kg Intravenous Q24 Hr x 5  .  LORazepam  0.5 mg Intravenous Once  . multivitamin with minerals  1 tablet Oral Daily  . mycophenolate  500 mg Oral BID  . predniSONE  60 mg Oral Q breakfast  . pyridostigmine  90 mg Oral 5 X Daily   Continuous Infusions: . sodium chloride 50 mL/hr at 02/16/18 0339  . therapeutic plasma exchange solution    . calcium gluconate IVPB    . citrate dextrose       LOS: 1 day   Time spent: 35 minutes.  Patrecia Pour, MD [facility] [email or web address] Password [facility] 02/16/2018, 3:09 PM

## 2018-02-16 NOTE — Progress Notes (Signed)
NIF >-40 VC .75L with good effort.

## 2018-02-16 NOTE — Plan of Care (Signed)
  Problem: Education: Goal: Knowledge of General Education information will improve Description: Including pain rating scale, medication(s)/side effects and non-pharmacologic comfort measures Outcome: Progressing   Problem: Health Behavior/Discharge Planning: Goal: Ability to manage health-related needs will improve Outcome: Progressing   Problem: Clinical Measurements: Goal: Ability to maintain clinical measurements within normal limits will improve Outcome: Progressing   Problem: Clinical Measurements: Goal: Will remain free from infection Outcome: Progressing   Problem: Clinical Measurements: Goal: Diagnostic test results will improve Outcome: Progressing   Problem: Clinical Measurements: Goal: Respiratory complications will improve Outcome: Progressing   Problem: Clinical Measurements: Goal: Cardiovascular complication will be avoided Outcome: Progressing   Problem: Activity: Goal: Risk for activity intolerance will decrease Outcome: Progressing   Problem: Nutrition: Goal: Adequate nutrition will be maintained Outcome: Progressing   

## 2018-02-16 NOTE — Consult Note (Signed)
Chief Complaint: Patient was seen in consultation today for myasthenia gravis.  Referring Physician(s): Loma Boston  Supervising Physician: Sandi Mariscal  Patient Status: [facility] - In-pt  History of Present Illness: Suzanne Nguyen is a 22 y.o. female with a past medical history of bronchitis, pneumonia, myasthenia gravis associated with thymoma, and PCOS. She presented to ED 02/15/2018 with complaint of generalized weakness at the request of her outpatient neurologist, Dr. Posey Pronto. She was found to be in myasthenia gravis crisis, and was admitted for management.  IR requested by Dr. Hal Hope and Dr. Rory Percy for possible image-guided tunneled hemodialysis catheter placement for plasma exchange. Patient awake and alert sitting in chair with no complaints at this time. Denies fever, chills, chest pain, dyspnea, abdominal pain, dizziness, or headache.   Past Medical History:  Diagnosis Date  . Bronchitis   . Myasthenia gravis associated with thymoma ([place])   . PCOS (polycystic ovarian syndrome)   . Pneumonia     Past Surgical History:  Procedure Laterality Date  . None      Allergies: Magnesium-containing compounds; Corticosteroids; Other; Procainamide; and Soy allergy  Medications: Prior to Admission medications   Medication Sig Start Date End Date Taking? Authorizing Provider  CALCIUM PO Take 600 mg by mouth 2 (two) times daily. With vitamin D3 800 units   Yes [provider]  ibuprofen (ADVIL,MOTRIN) 200 MG tablet Take 200 mg by mouth every 6 (six) hours as needed for mild pain.   Yes [provider]  Multiple Vitamin (MULTIVITAMIN) tablet Take 1 tablet by mouth daily.   Yes [provider]  mycophenolate (CELLCEPT) 500 MG tablet Take 1,000 mg by mouth 2 (two) times daily.    Yes [provider]  predniSONE (DELTASONE) 20 MG tablet Take 2 tablets (40 mg total) by mouth daily with breakfast. Patient taking  differently: Take 60 mg by mouth daily with breakfast.  09/25/17  Yes Patel, Donika K, DO  pyridostigmine (MESTINON) 60 MG tablet Take 1-2 tablet four times at 6am, 11am, 3pm, and 8pm. Patient taking differently: Take 60-90 mg by mouth 5 (five) times daily. Take 60 mg -90 mg tablet four times at 6am, 11am, 3pm, 8pm, and 01am 02/08/18  Yes Patel, Donika K, DO  pyridostigmine (MESTINON) 180 MG CR tablet Take 1 tablet (180 mg total) by mouth at bedtime. Patient not taking: Reported on 02/15/2018 02/08/18   Alda Berthold, DO     Family History  Problem Relation Age of Onset  . Gastric cancer Mother   . Hypertension Mother   . Diabetes Father   . Hypertension Father   . Heart disease Father   . Cancer Maternal Grandmother   . Heart disease Maternal Grandmother   . Lung cancer Maternal Grandfather   . Heart disease Maternal Grandfather   . Stroke Maternal Grandfather   . Diabetes Maternal Grandfather   . Diabetes Paternal Grandmother     Social History   Socioeconomic History  . Marital status: Single    Spouse name: Not on file  . Number of children: 0  . Years of education: Not on file  . Highest education level: Not on file  Occupational History  . Occupation: Ship broker  Social Needs  . Financial resource strain: Not on file  . Food insecurity:    Worry: Not on file    Inability: Not on file  . Transportation needs:    Medical: Not on file    Non-medical: Not on file  Tobacco  Use  . Smoking status: Never Smoker  . Smokeless tobacco: Never Used  Substance and Sexual Activity  . Alcohol use: No  . Drug use: No  . Sexual activity: Never  Lifestyle  . Physical activity:    Days per week: Not on file    Minutes per session: Not on file  . Stress: Not on file  Relationships  . Social connections:    Talks on phone: Not on file    Gets together: Not on file    Attends religious service: Not on file    Active member of club or organization: Not on file    Attends meetings of  clubs or organizations: Not on file    Relationship status: Not on file  Other Topics Concern  . 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    No children.      Review of Systems: A 12 point ROS discussed and pertinent positives are indicated in the HPI above.  All other systems are negative.  Review of Systems  Constitutional: Negative for chills and fever.  Respiratory: Negative for shortness of breath and wheezing.   Cardiovascular: Negative for chest pain and palpitations.  Gastrointestinal: Negative for abdominal pain.  Neurological: Negative for dizziness and headaches.  Psychiatric/Behavioral: Negative for behavioral problems and confusion.    Vital Signs: BP (!) 139/93 (BP Location: Right Arm)   Pulse (!) 112   Temp 97.6 F (36.4 C) (Oral)   Resp (!) 27   Ht 5\' 6"  (1.676 m)   Wt 200 lb 9.9 oz (91 kg)   SpO2 99%   BMI 32.38 kg/m   Physical Exam  Constitutional: She is oriented to person, place, and time. She appears well-developed and well-nourished. No distress.  Cardiovascular: Normal rate, regular rhythm and normal heart sounds.  No murmur heard. Pulmonary/Chest: Effort normal and breath sounds normal. No respiratory distress. She has no wheezes.  Neurological: She is alert and oriented to person, place, and time.  Skin: Skin is warm and dry.  Psychiatric: She has a normal mood and affect. Her behavior is normal. Judgment and thought content normal.  Nursing note and vitals reviewed.    MD Evaluation Airway: WNL Heart: WNL Abdomen: WNL Chest/ Lungs: WNL ASA  Classification: 2 Mallampati/Airway Score: One   Imaging: Dg Chest Port 1 View  Result Date: 02/16/2018 CLINICAL DATA:  Shortness of breath.  History of bronchitis. EXAM: PORTABLE CHEST 1 VIEW COMPARISON:  None. FINDINGS: Cardiomediastinal silhouette is unremarkable for this low inspiratory examination with crowded  vasculature markings. Elevated LEFT hemidiaphragm. The lungs are clear without pleural effusions or focal consolidations. Trachea projects midline and there is no pneumothorax. Included soft tissue planes and osseous structures are non-suspicious. IMPRESSION: 1. No acute cardiopulmonary process. 2. Elevated LEFT hemidiaphragm. Electronically Signed   By: Elon Alas M.D.   On: 02/16/2018 06:37    Labs:  CBC: Recent Labs    02/08/18 1111 02/15/18 1025 02/15/18 1656 02/16/18 0304  WBC 10.5 10.2 9.7 5.8  HGB 13.5 13.2 13.2 11.9*  HCT 41.0 39.7 40.8 37.2  PLT 318.0 304.0 305 276    COAGS: Recent Labs    02/16/18 0902  INR 1.01    BMP: Recent Labs    08/21/17 1556 02/08/18 1111 02/15/18 1025 02/15/18 1656 02/16/18 0304  NA 140 139 140 140 138  K 4.0 3.8 3.8 4.0 3.5  CL 103 102 102 103 100  CO2 28 30 28 27 28   GLUCOSE 105* 98 143* 183* 81  BUN <5* 10 10 7 8   CALCIUM 9.7 10.1 10.0 9.8 9.5  CREATININE 0.59 0.64 0.72 0.69 0.67  GFRNONAA >60  --   --  >60 >60  GFRAA >60  --   --  >60 >60    LIVER FUNCTION TESTS: Recent Labs    02/08/18 1111 02/15/18 1025 02/15/18 1656 02/16/18 0304  BILITOT 0.4 0.4 0.5 0.5  AST 19 28 43* 32  ALT 16 27 38 29  ALKPHOS 47 44 49 38  PROT 7.4 7.1 6.7 6.8  ALBUMIN 4.6 4.4 4.0 3.6    TUMOR MARKERS: No results for input(s): AFPTM, CEA, CA199, CHROMGRNA in the last 8760 hours.  Assessment and Plan:  Myasthenia gravis in need of plasma exchange. Plan for image-guided tunneled hemodialysis catheter placement tentatively for 9/15 with Dr. Pascal Lux. Patient will be NPO at midnight. Denies fever and WBCs WNL. She does not take blood thinners. INR 1.01 seconds today.  Risks and benefits discussed with the patient including, but not limited to bleeding, infection, vascular injury, pneumothorax which may require chest tube placement, air embolism or even death. All of the patient's questions were answered, patient is agreeable to  proceed. Consent signed and in chart.   Thank you for this interesting consult.  I greatly enjoyed meeting Aahana Bias-Johnson and look forward to participating in their care.  A copy of this report was sent to the requesting provider on this date.  Electronically Signed: Earley Abide, PA-C 02/16/2018, 11:48 AM   I spent a total of 20 Minutes in face to face in clinical consultation, greater than 50% of which was counseling/coordinating care for myasthenia gravis.

## 2018-02-16 NOTE — Consult Note (Addendum)
Suzanne Nguyen  UJW:[id number] DOB: [birth year] DOA: 02/15/2018 PCP: Patient, No Pcp Per    LOS: 1 day   Reason for Consult / Chief Complaint:  Concern for declining resp status and possible need for intubation  Consulting MD and date:  Hal Hope MD  HPI/Summary of hospital stay:  22 yr old female with PMHx of MG (diagnosed in March 2019), s/p thymectomy in July 2019 presenting with generalized weakness and diplopia. Admitted for MG crisis.  Subjective:  On my evaluation pt has no complaints states her breathing is much better after receiving her mestinon. She associates her earlier episode to having to lay flat in CT scan and increased anxiety concerning her medical condition.  She denies chest pain, coughing, difficulty swallowing, breathlessness.   Objective   Blood pressure (!) 132/101, pulse (!) 106, temperature 98 F (36.7 C), temperature source Oral, resp. rate 18, height 5\' 6"  (1.676 m), weight 91 kg, SpO2 100 %.       No intake or output data in the 24 hours ending 02/16/18 0207 Filed Weights   02/15/18 2300  Weight: 91 kg    Examination: General: in no acute distress HENT: NCAT Lungs: good air entry bilaterally with some slight decrease at bases but no significant rhonci or wheezing Cardiovascular: S1 and S2 Abdomen: soft NT ND + BS Extremities: no gross abnormalities  Neuro:AAOx4, no new focal deficits in comparison to admission  Consults: date of consult/date signed off & final recs:  Neurology following: NIF and VC Q 8 hr Prednisone 60mg  daily Cellcept 500mg  BID Mestinon 120mg  QID IR tunneled cath for plasma exchange starting on 02/18/18   Procedures: -  IR catheter  Significant Diagnostic Tests: NIF -36 VC 0.75L  Micro Data: Strep A cx negative  Antimicrobials:  None   Resolved Hospital Problem list    Assessment & Plan:  Myasthenic Crisis  Pt currently in no acute respiratory distress.  Received mestinon feels improved I have  reviewed her VC and NIF documented by RT Currently on RA Sat >95% no increased WOB Pt does not need escalation of care or intubation at this time  Disposition / Summary of Today's Plan 02/16/18   No need for ICU monitoring and management at this time.   May remain on current level of care If NIF and VC decline (with good effort and positioning) check ABG for CO2 retention (NIF 1/3 below normal and VC<30 mL/kg of ideal body weight are concerning for respiratory compromise) If respiratory status declines please re-consult     DVT prophylaxis: SCDs GI prophylaxis: none Diet: regular Mobility:no restrictions for now Code Status: FULL Family Communication: not at bedside at time of evaluation  Labs   CBC: Recent Labs  Lab 02/15/18 1025 02/15/18 1656  WBC 10.2 9.7  NEUTROABS 9.0* 7.8*  HGB 13.2 13.2  HCT 39.7 40.8  MCV 88.5 91.1  PLT 304.0 562   Basic Metabolic Panel: Recent Labs  Lab 02/15/18 1025 02/15/18 1656  NA 140 140  K 3.8 4.0  CL 102 103  CO2 28 27  GLUCOSE 143* 183*  BUN 10 7  CREATININE 0.72 0.69  CALCIUM 10.0 9.8   GFR: Estimated Creatinine Clearance: 126.4 mL/min (by C-G formula based on SCr of 0.69 mg/dL). Recent Labs  Lab 02/15/18 1025 02/15/18 1656  WBC 10.2 9.7   Liver Function Tests: Recent Labs  Lab 02/15/18 1025 02/15/18 1656  AST 28 43*  ALT 27 38  ALKPHOS 44 49  BILITOT 0.4 0.5  PROT 7.1 6.7  ALBUMIN 4.4 4.0   No results for input(s): LIPASE, AMYLASE in the last 168 hours. No results for input(s): AMMONIA in the last 168 hours. ABG No results found for: PHART, PCO2ART, PO2ART, HCO3, TCO2, ACIDBASEDEF, O2SAT  Coagulation Profile: No results for input(s): INR, PROTIME in the last 168 hours. Cardiac Enzymes: No results for input(s): CKTOTAL, CKMB, CKMBINDEX, TROPONINI in the last 168 hours. HbA1C: No results found for: HGBA1C CBG: No results for input(s): GLUCAP in the last 168 hours.   Review of Systems:   Suzanne KitchenMarland KitchenReview of  Systems  Constitutional: Negative for chills and fever.  HENT: Negative for congestion.   Eyes: Positive for double vision.  Respiratory: Negative for cough, sputum production, shortness of breath and stridor.   Cardiovascular: Negative for chest pain and leg swelling.  Gastrointestinal: Negative.   Genitourinary: Negative.   Musculoskeletal: Positive for myalgias.  Neurological: Positive for weakness.  Endo/Heme/Allergies: Negative.   Psychiatric/Behavioral: Negative.     Past medical history  She,  has a past medical history of Bronchitis, Myasthenia gravis associated with thymoma ([place]), PCOS (polycystic ovarian syndrome), and Pneumonia.   Surgical History    Past Surgical History:  Procedure Laterality Date  . None       Social History   Social History   Socioeconomic History  . Marital status: Single    Spouse name: Not on file  . Number of children: 0  . Years of education: Not on file  . Highest education level: Not on file  Occupational History  . Occupation: Ship broker  Social Needs  . Financial resource strain: Not on file  . Food insecurity:    Worry: Not on file    Inability: Not on file  . Transportation needs:    Medical: Not on file    Non-medical: Not on file  Tobacco Use  . Smoking status: Never Smoker  . Smokeless tobacco: Never Used  Substance and Sexual Activity  . Alcohol use: No  . Drug use: No  . Sexual activity: Never  Lifestyle  . Physical activity:    Days per week: Not on file    Minutes per session: Not on file  . Stress: Not on file  Relationships  . Social connections:    Talks on phone: Not on file    Gets together: Not on file    Attends religious service: Not on file    Active member of club or organization: Not on file    Attends meetings of clubs or organizations: Not on file    Relationship status: Not on file  . Intimate partner violence:    Fear of current or ex partner: Not on file    Emotionally abused: Not on file      Physically abused: Not on file    Forced sexual activity: Not on file  Other Topics Concern  . Not on file  Social History Narrative   Student at [employer] - pyschology   From [place] with her roommate.    No children.   ,  reports that she has never smoked. She has never used smokeless tobacco. She reports that she does not drink alcohol or use drugs.   Family history   Her family history includes Cancer in her maternal grandmother; Diabetes in her father, maternal grandfather, and paternal grandmother; Gastric cancer in her mother; Heart disease in her father, maternal grandfather, and maternal grandmother; Hypertension in her father and  mother; Lung cancer in her maternal grandfather; Stroke in her maternal grandfather.   Allergies Allergies  Allergen Reactions  . Magnesium-Containing Compounds Anaphylaxis    paralysis  . Corticosteroids     Worsen symptoms of MYASTHENIA GRAVES  . Other     Mycins  . Procainamide     Irregular Heart beat  . Soy Allergy Itching    Home meds  Prior to Admission medications   Medication Sig Start Date End Date Taking? Authorizing Provider  CALCIUM PO Take 600 mg by mouth 2 (two) times daily. With vitamin D3 800 units   Yes [provider]  ibuprofen (ADVIL,MOTRIN) 200 MG tablet Take 200 mg by mouth every 6 (six) hours as needed for mild pain.   Yes [provider]  Multiple Vitamin (MULTIVITAMIN) tablet Take 1 tablet by mouth daily.   Yes [provider]  mycophenolate (CELLCEPT) 500 MG tablet Take 1,000 mg by mouth 2 (two) times daily.    Yes [provider]  predniSONE (DELTASONE) 20 MG tablet Take 2 tablets (40 mg total) by mouth daily with breakfast. Patient taking differently: Take 60 mg by mouth daily with breakfast.  09/25/17  Yes Patel, Donika K, DO  pyridostigmine (MESTINON) 60 MG tablet Take 1-2 tablet four times at 6am, 11am, 3pm, and 8pm. Patient taking differently: Take 60-90 mg by  mouth 5 (five) times daily. Take 60 mg -90 mg tablet four times at 6am, 11am, 3pm, 8pm, and 01am 02/08/18  Yes Patel, Donika K, DO  pyridostigmine (MESTINON) 180 MG CR tablet Take 1 tablet (180 mg total) by mouth at bedtime. Patient not taking: Reported on 02/15/2018 02/08/18   Alda Berthold, DO    Signed Dr Seward [facility]

## 2018-02-16 NOTE — Progress Notes (Signed)
Neurology Progress Note   S:// Seen and examined NIF: -38 VC: 0.45L - patient admits to not being able to make a good seal  O:// Current vital signs: BP (!) 139/93 (BP Location: Right Arm)   Pulse (!) 112   Temp 97.6 F (36.4 C) (Oral)   Resp (!) 27   Ht 5\' 6"  (1.676 m)   Wt 91 kg   SpO2 99%   BMI 32.38 kg/m  Vital signs in last 24 hours: Temp:  [97.6 F (36.4 C)-98.3 F (36.8 C)] 97.6 F (36.4 C) (09/14 1117) Pulse Rate:  [80-114] 112 (09/14 1117) Resp:  [16-27] 27 (09/14 1117) BP: (118-147)/(67-110) 139/93 (09/14 1117) SpO2:  [91 %-100 %] 99 % (09/14 1117) Weight:  [91 kg] 91 kg (09/14 0500)  Gen: WD WN NAD HEENT: [place] AT MMM, neck flexor at least 4/5 CVS: S1S2+, RRR Resp: CTABL Neurological Exam: AAOx3 Speech is clear Naming comprehension and repetition intact CN: disconjugate gaze, PERRL, unable to complete adduct right eye, diplopia on leftward gaze, face symmetric, mild ptosis b/l that is exacerbated with sustained upward gaze. Motor exam: Fatigable 4/5 upper extremities, fatigable 4+/5 lower extremities Sensory: Intact light touch Coordination: Finger-nose intact DTRs: Evenly brisk all over-2+ to 3+   Medications  Current Facility-Administered Medications:  .  0.9 %  sodium chloride infusion, , Intravenous, Continuous, Rise Patience, MD, Last Rate: 50 mL/hr at 02/16/18 0339 .  acetaminophen (TYLENOL) tablet 650 mg, 650 mg, Oral, Q6H PRN, 650 mg at 02/16/18 0541 **OR** acetaminophen (TYLENOL) suppository 650 mg, 650 mg, Rectal, Q6H PRN, Rise Patience, MD .  acetaminophen (TYLENOL) tablet 650 mg, 650 mg, Oral, Q4H PRN, Amie Portland, MD .  albumin human 25 % 50 g in sodium chloride 0.9 %, , Dialysis, Once in dialysis, Amie Portland, MD .  calcium carbonate (TUMS - dosed in mg elemental calcium) chewable tablet 500 mg, 500 mg, Oral, BID, Rise Patience, MD, 500 mg at 02/16/18 0953 .  calcium gluconate 2 g in sodium chloride 0.9 % 250 mL IVPB,  2 g, Intravenous, Once, Amie Portland, MD .  citrate dextrose (ACD-A anticoagulant) solution 500 mL, 500 mL, Intravenous, Continuous, Amie Portland, MD .  diphenhydrAMINE (BENADRYL) capsule 25 mg, 25 mg, Oral, Q6H PRN, Amie Portland, MD .  diphenhydrAMINE (BENADRYL) capsule 25 mg, 25 mg, Oral, Daily, Rise Patience, MD, 25 mg at 02/16/18 0015 .  feeding supplement (BOOST / RESOURCE BREEZE) liquid 1 Container, 1 Container, Oral, TID BM, Patrecia Pour, MD, 1 Container at 02/16/18 1449 .  heparin injection 1,000 Units, 1,000 Units, Intracatheter, Once, Amie Portland, MD .  Immune Globulin 10% (PRIVIGEN) IV infusion 35 g, 400 mg/kg, Intravenous, Q24 Hr x 5, Rise Patience, MD, Stopped at 02/16/18 0331 .  LORazepam (ATIVAN) injection 0.5 mg, 0.5 mg, Intravenous, Once, Ward, [employer], PA-C, Stopped at 02/15/18 2050 .  multivitamin with minerals tablet 1 tablet, 1 tablet, Oral, Daily, Rise Patience, MD, 1 tablet at 02/16/18 0954 .  mycophenolate (CELLCEPT) capsule 500 mg, 500 mg, Oral, BID, Rise Patience, MD, 500 mg at 02/16/18 0953 .  predniSONE (DELTASONE) tablet 60 mg, 60 mg, Oral, Q breakfast, Rise Patience, MD, 60 mg at 02/16/18 0954 .  pyridostigmine (MESTINON) tablet 90 mg, 90 mg, Oral, 5 X Daily, Rise Patience, MD, 90 mg at 02/16/18 1449 Labs CBC    Component Value Date/Time   WBC 5.8 02/16/2018 0304   RBC 4.07 02/16/2018 0304  HGB 11.9 (L) 02/16/2018 0304   HCT 37.2 02/16/2018 0304   PLT 276 02/16/2018 0304   MCV 91.4 02/16/2018 0304   MCH 29.2 02/16/2018 0304   MCHC 32.0 02/16/2018 0304   RDW 14.4 02/16/2018 0304   LYMPHSABS 2.5 02/16/2018 0304   MONOABS 0.1 02/16/2018 0304   EOSABS 0.1 02/16/2018 0304   BASOSABS 0.0 02/16/2018 0304    CMP     Component Value Date/Time   NA 138 02/16/2018 0304   K 3.5 02/16/2018 0304   CL 100 02/16/2018 0304   CO2 28 02/16/2018 0304   GLUCOSE 81 02/16/2018 0304   BUN 8 02/16/2018 0304    CREATININE 0.67 02/16/2018 0304   CALCIUM 9.5 02/16/2018 0304   PROT 6.8 02/16/2018 0304   ALBUMIN 3.6 02/16/2018 0304   AST 32 02/16/2018 0304   ALT 29 02/16/2018 0304   ALKPHOS 38 02/16/2018 0304   BILITOT 0.5 02/16/2018 0304   GFRNONAA >60 02/16/2018 0304   GFRAA >60 02/16/2018 0304   Assessment:  22 year old with seropositive myasthenia gravis, status post thymectomy with possible myasthenic crisis. Scheduled for plasma exchange-most likely starting Monday. On IVIG that was started yesterday- will continue for 2 days.  Recommendations: -Pending plasma exchange catheter placement.  Please ensure tunneled catheter as patient will require prolonged and outpatient plasma exchanges. -0.4 mg/kg IVIG x2 doses till she starts plasma exchange. -NIF/FVC's every 8 hours -Mestinon 120 mg 4 times daily -CellCept 500 twice daily -Prednisone 60 mg daily Plan discussed with Dr. Bonner Puna on the unit. We will follow with you.  -- Amie Portland, MD [facility] Pager: [phone] If 7pm to 7am, please call on call as listed on [facility].

## 2018-02-17 ENCOUNTER — Encounter (HOSPITAL_COMMUNITY): Payer: Self-pay | Admitting: Interventional Radiology

## 2018-02-17 ENCOUNTER — Inpatient Hospital Stay (HOSPITAL_COMMUNITY): Payer: Managed Care, Other (non HMO)

## 2018-02-17 HISTORY — PX: IR US GUIDE VASC ACCESS RIGHT: IMG2390

## 2018-02-17 HISTORY — PX: IR FLUORO GUIDE CV LINE RIGHT: IMG2283

## 2018-02-17 LAB — POCT I-STAT, CHEM 8
BUN: 5 mg/dL — ABNORMAL LOW (ref 6–20)
CALCIUM ION: 1.25 mmol/L (ref 1.15–1.40)
CHLORIDE: 107 mmol/L (ref 98–111)
Creatinine, Ser: 0.7 mg/dL (ref 0.44–1.00)
Glucose, Bld: 87 mg/dL (ref 70–99)
HEMATOCRIT: 37 % (ref 36.0–46.0)
Hemoglobin: 12.6 g/dL (ref 12.0–15.0)
POTASSIUM: 4 mmol/L (ref 3.5–5.1)
SODIUM: 140 mmol/L (ref 135–145)
TCO2: 30 mmol/L (ref 22–32)

## 2018-02-17 MED ORDER — HEPARIN SODIUM (PORCINE) 1000 UNIT/ML IJ SOLN
INTRAMUSCULAR | Status: AC
  Filled 2018-02-17: qty 1

## 2018-02-17 MED ORDER — SODIUM CHLORIDE 0.9 % IV SOLN
INTRAVENOUS | Status: AC
  Administered 2018-02-17 (×3): via INTRAVENOUS_CENTRAL
  Filled 2018-02-17 (×3): qty 200

## 2018-02-17 MED ORDER — CEFAZOLIN SODIUM-DEXTROSE 1-4 GM/50ML-% IV SOLN
INTRAVENOUS | Status: AC | PRN
  Administered 2018-02-17: 2 g via INTRAVENOUS

## 2018-02-17 MED ORDER — MIDAZOLAM HCL 2 MG/2ML IJ SOLN
INTRAMUSCULAR | Status: AC | PRN
  Administered 2018-02-17 (×3): 1 mg via INTRAVENOUS

## 2018-02-17 MED ORDER — ACD FORMULA A 0.73-2.45-2.2 GM/100ML VI SOLN
500.0000 mL | Status: DC
  Filled 2018-02-17: qty 500

## 2018-02-17 MED ORDER — LIDOCAINE HCL (PF) 1 % IJ SOLN
INTRAMUSCULAR | Status: AC | PRN
  Administered 2018-02-17: 10 mL

## 2018-02-17 MED ORDER — CEFAZOLIN SODIUM-DEXTROSE 2-4 GM/100ML-% IV SOLN
INTRAVENOUS | Status: AC
  Filled 2018-02-17: qty 100

## 2018-02-17 MED ORDER — DIPHENHYDRAMINE HCL 25 MG PO CAPS: 25.0000 mg | ORAL_CAPSULE | Freq: Four times a day (QID) | ORAL | Status: DC | PRN

## 2018-02-17 MED ORDER — LIDOCAINE HCL 1 % IJ SOLN
INTRAMUSCULAR | Status: AC
  Filled 2018-02-17: qty 20

## 2018-02-17 MED ORDER — SODIUM CHLORIDE 0.9 % IV SOLN
2.0000 g | Freq: Once | INTRAVENOUS | Status: AC
  Administered 2018-02-17: 2 g via INTRAVENOUS
  Filled 2018-02-17: qty 20

## 2018-02-17 MED ORDER — MIDAZOLAM HCL 2 MG/2ML IJ SOLN
INTRAMUSCULAR | Status: AC
  Filled 2018-02-17: qty 4

## 2018-02-17 MED ORDER — HEPARIN SODIUM (PORCINE) 1000 UNIT/ML IJ SOLN: 1000.0000 [IU] | Freq: Once | INTRAMUSCULAR | Status: DC

## 2018-02-17 MED ORDER — ACD FORMULA A 0.73-2.45-2.2 GM/100ML VI SOLN
Status: AC
  Filled 2018-02-17: qty 500

## 2018-02-17 MED ORDER — ACETAMINOPHEN 325 MG PO TABS: 650.0000 mg | ORAL_TABLET | ORAL | Status: DC | PRN

## 2018-02-17 MED ORDER — CALCIUM CARBONATE ANTACID 500 MG PO CHEW: 2.0000 | CHEWABLE_TABLET | ORAL | Status: DC

## 2018-02-17 MED ORDER — FENTANYL CITRATE (PF) 100 MCG/2ML IJ SOLN
INTRAMUSCULAR | Status: AC
  Filled 2018-02-17: qty 2

## 2018-02-17 MED ORDER — FENTANYL CITRATE (PF) 100 MCG/2ML IJ SOLN
INTRAMUSCULAR | Status: AC | PRN
  Administered 2018-02-17 (×2): 50 ug via INTRAVENOUS

## 2018-02-17 NOTE — Progress Notes (Signed)
Attempted to see the patient x2 with patient being out for catheter placement and then for plasma exchange. Patient will be followed by neurology  Recommendations: -Complete 5 rounds of plasma exchange every other day - Please leave the catheter in place that she might need outpatient plasma exchanges after these 5 rounds are completed.  -Follow NIF and FVC every 8 hours Further recommendations as per clinical course  -- Amie Portland, MD [facility] Pager: [phone] If 7pm to 7am, please call on call as listed on [facility]ION.

## 2018-02-17 NOTE — Sedation Documentation (Addendum)
[facility] called to [employer], Therapist, sports. Transport requested.

## 2018-02-17 NOTE — Progress Notes (Signed)
NIF of -45, VC of 1.2L with good patient effort.

## 2018-02-17 NOTE — Procedures (Signed)
Pre-procedure Diagnosis: Myasthenia Gravis Post-procedure Diagnosis: Same  Successful placement of tunneled HD catheter with tips terminating within the superior aspect of the right atrium.    Complications: None Immediate  EBL: Minimal   The catheter is ready for immediate use.   Ronny Bacon, MD Pager #: [phone]

## 2018-02-17 NOTE — Progress Notes (Signed)
PROGRESS NOTE  Johnica Armwood YIF:[id number] DOB: 10/05/1995 DOA: 02/15/2018 PCP: Patient, No Pcp Per  HPI/Recap of past 24 hours: Nasim Garofano is a 22 y.o. female with a history of myasthenia gravis Dx March 2019 s/p thymectomy July 2019. 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 She presented with progressive weakness over the previous few days in the extremities, easy fatigue with chewing and double vision. She was admitted with plans for plasma exchange following tunneled catheter placement per neurology. PCCM was consulted, but she has remained stable under close observation in the step down unit.   02/17/2018: seen and examined at bedside. Went for tunneled cath.  Somnolent but easily arousable to voices.  No new complaints. No dyspnea.   Assessment/Plan: Principal Problem:   Myasthenia gravis in crisis [facility]) Active Problems:   Polycystic ovarian syndrome   S/P thymectomy   Acute respiratory failure (HCC)   Malnutrition of moderate degree  Myasthenia gravis with myasthenic crisis: Symptoms oddly worsened following thymectomy.  - Appreciate neurology driving care for this patient. Plan for tunneled catheter placement by IR today vs. tomorrow and starting PLEX 9/16. As temporizing measure, though she's had limited response in the past, will give IVIG. - Monitor NIF and FVC q8h. Vital capacity of 459m is about 7.568mkg IBW which is severely diminished and would suggest worse respiratory compromise than the patient is currently exhibiting. NIF -38. Check for hypercarbia with ABG. Never required intubation. - Mestinon '90mg'$  5x/day - CellCept '500mg'$  BID - Prednisone '60mg'$  daily  Obesity: Body mass index is 32.38 kg/m.   PCOS: Noted, quiescent.  Sinus tach, resolved Continue to monitor  Hypertension Blood pressure is now stable   Code Status: Full   Family Communication: None at bedside   Disposition Plan: Pending  clinical progress   Consultants:  Neurology  PCCM  Interventional radiology  Procedures: None  Antimicrobials:  None  DVT prophylaxis: SCDs   Objective: Vitals:   02/17/18 0912 02/17/18 0916 02/17/18 0925 02/17/18 1132  BP: (!) 147/83 (!) 137/94 134/85 135/86  Pulse: (!) 105 (!) 110 (!) 108 (!) 104  Resp: (!) 26 (!) 28 (!) 28 (!) 33  Temp:    98.4 F (36.9 C)  TempSrc:    Oral  SpO2: 100% 100% 100% 100%  Weight:      Height:        Intake/Output Summary (Last 24 hours) at 02/17/2018 1254 Last data filed at 02/17/2018 0800 Gross per 24 hour  Intake 423.81 ml  Output -  Net 423.81 ml   Filed Weights   02/15/18 2300 02/16/18 0500  Weight: 91 kg 91 kg    Exam:  . General: [age].o. year-old female well developed well nourished in no acute distress.  Alert and oriented x3. . Cardiovascular: Regular rate and rhythm with no rubs or gallops.  No thyromegaly or JVD noted.   . Marland Kitchenespiratory: Clear to auscultation with no wheezes or rales. Good inspiratory effort. . Abdomen: Soft nontender nondistended with normal bowel sounds x4 quadrants. . Musculoskeletal: No lower extremity edema. 2/4 pulses in all 4 extremities. . Skin: No ulcerative lesions noted or rashes, . Psychiatry: Mood is appropriate for condition and setting   Data Reviewed: CBC: Recent Labs  Lab 02/15/18 1025 02/15/18 1656 02/16/18 0304  WBC 10.2 9.7 5.8  NEUTROABS 9.0* 7.8* 3.2  HGB 13.2 13.2 11.9*  HCT 39.7 40.8 37.2  MCV 88.5 91.1 91.4  PLT 304.0 305  211   Basic Metabolic Panel: Recent Labs  Lab 02/15/18 1025 02/15/18 1656 02/16/18 0304  NA 140 140 138  K 3.8 4.0 3.5  CL 102 103 100  CO2 '28 27 28  '$ GLUCOSE 143* 183* 81  BUN '10 7 8  '$ CREATININE 0.72 0.69 0.67  CALCIUM 10.0 9.8 9.5   GFR: Estimated Creatinine Clearance: 126.4 mL/min (by C-G formula based on SCr of 0.67 mg/dL). Liver Function Tests: Recent Labs  Lab 02/15/18 1025 02/15/18 1656 02/16/18 0304  AST 28 43* 32    ALT 27 38 29  ALKPHOS 44 49 38  BILITOT 0.4 0.5 0.5  PROT 7.1 6.7 6.8  ALBUMIN 4.4 4.0 3.6   No results for input(s): LIPASE, AMYLASE in the last 168 hours. No results for input(s): AMMONIA in the last 168 hours. Coagulation Profile: Recent Labs  Lab 02/16/18 0902  INR 1.01   Cardiac Enzymes: No results for input(s): CKTOTAL, CKMB, CKMBINDEX, TROPONINI in the last 168 hours. BNP (last 3 results) No results for input(s): PROBNP in the last 8760 hours. HbA1C: No results for input(s): HGBA1C in the last 72 hours. CBG: No results for input(s): GLUCAP in the last 168 hours. Lipid Profile: No results for input(s): CHOL, HDL, LDLCALC, TRIG, CHOLHDL, LDLDIRECT in the last 72 hours. Thyroid Function Tests: Recent Labs    02/16/18 0304  TSH 1.843   Anemia Panel: No results for input(s): VITAMINB12, FOLATE, FERRITIN, TIBC, IRON, RETICCTPCT in the last 72 hours. Urine analysis:    Component Value Date/Time   COLORURINE STRAW (A) 08/21/2017 1800   APPEARANCEUR CLEAR 08/21/2017 1800   LABSPEC 1.005 08/21/2017 1800   PHURINE 6.0 08/21/2017 1800   GLUCOSEU NEGATIVE 08/21/2017 1800   HGBUR NEGATIVE 08/21/2017 1800   BILIRUBINUR NEGATIVE 08/21/2017 1800   KETONESUR NEGATIVE 08/21/2017 1800   PROTEINUR NEGATIVE 08/21/2017 1800   NITRITE NEGATIVE 08/21/2017 1800   LEUKOCYTESUR NEGATIVE 08/21/2017 1800   Sepsis Labs: '@LABRCNTIP'$ (procalcitonin:4,lacticidven:4)  ) Recent Results (from the past 240 hour(s))  MRSA PCR Screening     Status: None   Collection Time: 02/15/18 11:28 PM  Result Value Ref Range Status   MRSA by PCR NEGATIVE NEGATIVE Final    Comment:        The GeneXpert MRSA Assay (FDA approved for NASAL specimens only), is one component of a comprehensive MRSA colonization surveillance program. It is not intended to diagnose MRSA infection nor to guide or monitor treatment for MRSA infections. Performed at [facility], [place] [address] [place], [place]  [address]       Studies: Ir Cyndy Freeze Guide Cv Line Right  Result Date: 02/17/2018 INDICATION: History of myasthenia gravis, in need of durable intravenous access for plasmapheresis. EXAM: TUNNELED CENTRAL VENOUS HEMODIALYSIS CATHETER PLACEMENT WITH ULTRASOUND AND FLUOROSCOPIC GUIDANCE MEDICATIONS: Ancef 2 gm IV . The antibiotic was given in an appropriate time interval prior to skin puncture. ANESTHESIA/SEDATION: Versed 3 mg IV; Fentanyl 100 mcg IV; Moderate Sedation Time:  15 minutes The patient was continuously monitored during the procedure by the interventional radiology nurse under my direct supervision. FLUOROSCOPY TIME:  30 seconds (6 mGy) COMPLICATIONS: None immediate. PROCEDURE: Informed written consent was obtained from the patient after a discussion of the risks, benefits, and alternatives to treatment. Questions regarding the procedure were encouraged and answered. The right neck and chest were prepped with chlorhexidine in a sterile fashion, and a sterile drape was applied covering the operative field. Maximum barrier sterile technique with sterile gowns and gloves were  used for the procedure. A timeout was performed prior to the initiation of the procedure. After creating a small venotomy incision, a micropuncture kit was utilized to access the internal jugular vein. Real-time ultrasound guidance was utilized for vascular access including the acquisition of a permanent ultrasound image documenting patency of the accessed vessel. The microwire was utilized to measure appropriate catheter length. A stiff Glidewire was advanced to the level of the IVC and the micropuncture sheath was exchanged for a peel-away sheath. A palindrome tunneled hemodialysis catheter measuring 19 cm from tip to cuff was tunneled in a retrograde fashion from the anterior chest wall to the venotomy incision. The catheter was then placed through the peel-away sheath with tips ultimately positioned within the superior aspect of  the right atrium. Final catheter positioning was confirmed and documented with a spot radiographic image. The catheter aspirates and flushes normally. The catheter was flushed with appropriate volume heparin dwells. The catheter exit site was secured with a 0-Prolene retention suture. The venotomy incision was closed with Dermabond and Steri-strips. Dressings were applied. The patient tolerated the procedure well without immediate post procedural complication. IMPRESSION: Successful placement of 19 cm tip to cuff tunneled hemodialysis catheter via the right internal jugular vein with tips terminating within the superior aspect of the right atrium. The catheter is ready for immediate use. Electronically Signed   By: Sandi Mariscal M.D.   On: 02/17/2018 10:37   Ir US Guide Vasc Access Right  Result Date: 02/17/2018 INDICATION: History of myasthenia gravis, in need of durable intravenous access for plasmapheresis. EXAM: TUNNELED CENTRAL VENOUS HEMODIALYSIS CATHETER PLACEMENT WITH ULTRASOUND AND FLUOROSCOPIC GUIDANCE MEDICATIONS: Ancef 2 gm IV . The antibiotic was given in an appropriate time interval prior to skin puncture. ANESTHESIA/SEDATION: Versed 3 mg IV; Fentanyl 100 mcg IV; Moderate Sedation Time:  15 minutes The patient was continuously monitored during the procedure by the interventional radiology nurse under my direct supervision. FLUOROSCOPY TIME:  30 seconds (6 mGy) COMPLICATIONS: None immediate. PROCEDURE: Informed written consent was obtained from the patient after a discussion of the risks, benefits, and alternatives to treatment. Questions regarding the procedure were encouraged and answered. The right neck and chest were prepped with chlorhexidine in a sterile fashion, and a sterile drape was applied covering the operative field. Maximum barrier sterile technique with sterile gowns and gloves were used for the procedure. A timeout was performed prior to the initiation of the procedure. After creating  a small venotomy incision, a micropuncture kit was utilized to access the internal jugular vein. Real-time ultrasound guidance was utilized for vascular access including the acquisition of a permanent ultrasound image documenting patency of the accessed vessel. The microwire was utilized to measure appropriate catheter length. A stiff Glidewire was advanced to the level of the IVC and the micropuncture sheath was exchanged for a peel-away sheath. A palindrome tunneled hemodialysis catheter measuring 19 cm from tip to cuff was tunneled in a retrograde fashion from the anterior chest wall to the venotomy incision. The catheter was then placed through the peel-away sheath with tips ultimately positioned within the superior aspect of the right atrium. Final catheter positioning was confirmed and documented with a spot radiographic image. The catheter aspirates and flushes normally. The catheter was flushed with appropriate volume heparin dwells. The catheter exit site was secured with a 0-Prolene retention suture. The venotomy incision was closed with Dermabond and Steri-strips. Dressings were applied. The patient tolerated the procedure well without immediate post procedural complication. IMPRESSION: Successful placement  of 19 cm tip to cuff tunneled hemodialysis catheter via the right internal jugular vein with tips terminating within the superior aspect of the right atrium. The catheter is ready for immediate use. Electronically Signed   By: Sandi Mariscal M.D.   On: 02/17/2018 10:37    Scheduled Meds: . calcium carbonate  500 mg Oral BID  . diphenhydrAMINE  25 mg Oral Daily  . feeding supplement  1 Container Oral TID BM  . fentaNYL      . heparin      . heparin  1,000 Units Intracatheter Once  . Immune Globulin 10%  400 mg/kg Intravenous Q24 Hr x 5  . lidocaine      . LORazepam  0.5 mg Intravenous Once  . midazolam      . multivitamin with minerals  1 tablet Oral Daily  . mycophenolate  500 mg Oral BID    . predniSONE  60 mg Oral Q breakfast  . pyridostigmine  90 mg Oral 5 X Daily    Continuous Infusions: . therapeutic plasma exchange solution    . calcium gluconate IVPB    . ceFAZolin    . citrate dextrose       LOS: 2 days     Kayleen Memos, MD [facility] Pager [phone]  If 7PM-7AM, please contact night-coverage [email or web address] Password [email or web address] 02/17/2018, 12:54 PM  -p

## 2018-02-17 NOTE — Progress Notes (Signed)
Pt achieved 1.05L (VC) and >-40 (NIF) both with very good effort.

## 2018-02-18 LAB — BASIC METABOLIC PANEL
Anion gap: 7 (ref 5–15)
BUN: 6 mg/dL (ref 6–20)
CHLORIDE: 111 mmol/L (ref 98–111)
CO2: 24 mmol/L (ref 22–32)
CREATININE: 0.66 mg/dL (ref 0.44–1.00)
Calcium: 9.5 mg/dL (ref 8.9–10.3)
GFR calc non Af Amer: >60 mL/min (ref >60)
GLUCOSE: 86 mg/dL (ref 70–99)
Potassium: 4.1 mmol/L (ref 3.5–5.1)
Sodium: 142 mmol/L (ref 135–145)

## 2018-02-18 LAB — CBC
HEMATOCRIT: 44.8 % (ref 36.0–46.0)
HEMOGLOBIN: 14.1 g/dL (ref 12.0–15.0)
MCH: 28.8 pg (ref 26.0–34.0)
MCHC: 31.5 g/dL (ref 30.0–36.0)
MCV: 91.6 fL (ref 78.0–100.0)
Platelets: 266 10*3/uL (ref 150–400)
RBC: 4.89 MIL/uL (ref 3.87–5.11)
RDW: 14.3 % (ref 11.5–15.5)
WBC: 10.8 10*3/uL — ABNORMAL HIGH (ref 4.0–10.5)

## 2018-02-18 MED ORDER — HEPARIN SODIUM (PORCINE) 1000 UNIT/ML IJ SOLN: 1000.0000 [IU] | Freq: Once | INTRAMUSCULAR | Status: DC

## 2018-02-18 MED ORDER — CALCIUM CARBONATE ANTACID 500 MG PO CHEW
2.0000 | CHEWABLE_TABLET | ORAL | Status: DC
  Administered 2018-02-19: 400 mg via ORAL

## 2018-02-18 MED ORDER — SODIUM CHLORIDE 0.9 % IV SOLN
2.0000 g | INTRAVENOUS | Status: DC
  Filled 2018-02-18: qty 20

## 2018-02-18 MED ORDER — DIPHENHYDRAMINE HCL 25 MG PO CAPS: 25.0000 mg | ORAL_CAPSULE | Freq: Four times a day (QID) | ORAL | Status: DC | PRN

## 2018-02-18 MED ORDER — CALCIUM CARBONATE ANTACID 500 MG PO CHEW
CHEWABLE_TABLET | ORAL | Status: AC
  Filled 2018-02-18: qty 2

## 2018-02-18 MED ORDER — ACD FORMULA A 0.73-2.45-2.2 GM/100ML VI SOLN
Status: AC
  Filled 2018-02-18: qty 500

## 2018-02-18 MED ORDER — ACD FORMULA A 0.73-2.45-2.2 GM/100ML VI SOLN: 500.0000 mL | Status: DC

## 2018-02-18 MED ORDER — SODIUM CHLORIDE 0.9 % IV SOLN
INTRAVENOUS | Status: DC
  Filled 2018-02-18 (×3): qty 200

## 2018-02-18 MED ORDER — ACETAMINOPHEN 325 MG PO TABS: 650.0000 mg | ORAL_TABLET | ORAL | Status: DC | PRN

## 2018-02-18 NOTE — Plan of Care (Signed)
  Problem: Clinical Measurements: Goal: Ability to maintain clinical measurements within normal limits will improve Outcome: Progressing   Problem: Activity: Goal: Risk for activity intolerance will decrease Outcome: Progressing   Problem: Coping: Goal: Level of anxiety will decrease Outcome: Progressing   

## 2018-02-18 NOTE — Progress Notes (Addendum)
Subjective: Today patient states that she feels stronger with her right hand and forearm.  Still having double vision when she looks to the left.  States she can bring food and water up to her mouth which she could not do prior.  Exam: Vitals:   02/18/18 0743 02/18/18 0745  BP:  127/85  Pulse:  (!) 110  Resp:  18  Temp: 98.6 F (37 C)   SpO2:  97%    Physical Exam   HEENT-  Normocephalic, no lesions, without obvious abnormality.  Normal external eye and conjunctiva.   Cardiovascular- S1-S2 audible, pulses palpable throughout   Lungs-no rhonchi or wheezing noted, no excessive working breathing.  Saturations within normal limits Abdomen- All 4 quadrants palpated and nontender Extremities- Warm, dry and intact Musculoskeletal-no joint tenderness, deformity or swelling Skin-warm and dry, no hyperpigmentation, vitiligo, or suspicious lesions    Neuro:  Mental Status: Alert, oriented, thought content appropriate.  Speech fluent without evidence of aphasia.  Able to follow 3 step commands without difficulty. Cranial Nerves: II: Visual fields grossly normal and vision is grossly normal however when she looks to the left she says she has double vision. III,IV, VI: Present bilateral eyes worse on the right than on the left, extra-ocular motions intact bilaterally-however it is noted that when she looks to the left and then comes back to the midline there is a lag in the left eye.  Pupils equal, round, reactive to light and accommodation V,VII: smile symmetric-however when asking her to lift her left cheek she does so with full strength when asking to lift her right cheek there is a significant difference in strength., facial light touch sensation normal bilaterally VIII: hearing normal bilaterally IX,X: uvula rises midline XI: bilateral shoulder shrug XII: midline tongue extension Motor: Right : Upper extremity   5/5    Left:     Upper extremity   5/5  - Right wrist extension and flexion  is slightly weaker than the left with 4+/5 strength  Lower extremity   5/5     Lower extremity   5/5 Diaphragmatic strength.  She is able to count to 20 with a single breath. Sensory: Pinprick and light touch intact throughout, bilaterally Deep Tendon Reflexes: 2+ and symmetric throughout mal rapid alternating movements and normal heel-to-shin test     Medications:  Scheduled: . calcium carbonate  500 mg Oral BID  . diphenhydrAMINE  25 mg Oral Daily  . feeding supplement  1 Container Oral TID BM  . LORazepam  0.5 mg Intravenous Once  . multivitamin with minerals  1 tablet Oral Daily  . mycophenolate  500 mg Oral BID  . predniSONE  60 mg Oral Q breakfast  . pyridostigmine  90 mg Oral 5 X Daily   Continuous:  IRJ:[id number] **OR** acetaminophen  Pertinent Labs/Diagnostics: WBC 10.8 Last VC and NIF was done at 11:09 PM yesterday and showed 1.05 L and > -40 NIF    Suzanne Quill PA-C [facility] [phone]   Assessment: 22 year old with seropositive myasthenia gravis status post thymectomy presenting with possible myasthenia crisis.  At this point she received 1 plasma exchange and will receive her second dose tomorrow.    Recommendations: -Continue 5 complete doses of plasma exchange--02/19/2018 will be the second dose of plasma exchange - NIF/FVC's every 8 hours -Mestinon 90 mg 5 times daily -CellCept 500 mg daily -Prednisone 60 mg daily -We will continue to follow patient    02/18/2018, 9:18 AM   NEUROHOSPITALIST ADDENDUM Performed a  face to face diagnostic evaluation.  Impression: MG exacerbation , has improved after PLEX Plan: Continue Plasma exchange I have reviewed the contents of history and physical exam as documented by PA/ARNP/Resident and agree with above documentation.  I have discussed and formulated the above plan as documented. Edits to the note have been made as needed.    Suzanne Addison Celester Lech MD [facility] [phone]    If 7pm to 7am, please call on call as listed on AMION.

## 2018-02-18 NOTE — Progress Notes (Addendum)
PROGRESS NOTE  Suzanne Nguyen [facility]MC:[id number] DOB: [birth year] DOA: 02/15/2018 PCP: Patient, No Pcp Per  HPI/Recap of past 50 hours: 22 year old American female with seropositive myasthenia gravis status post thigh pneumectomy who presented with myasthenia crisis.  She received her first plasma exchange.  She is planned to receive her second dose tomorrow February 19, 2018 she will complete 5 doses of the plasma exchange per neurology recommendation  Subjective: Patient seen at bedside denies any complaints today she says she is trying to rehydrate for the plasma exchange tomorrow.  Assessment/Plan: Principal Problem:   Myasthenia gravis in crisis [facility]) Active Problems:   Polycystic ovarian syndrome   S/P thymectomy   Acute respiratory failure (HCC)   Malnutrition of moderate degree  #1 myasthenia gravis with crisis continue plasma exchange sitting on 90 mg 5 times daily CellCept 500 mg daily prednisone 60 mg daily neurology is following  #2 sinus tachycardia her heart rate is between 103 at 107.  Patient is rehydrating which is hopefully help with her tachycardia  #3 hypertension controlled  #4 malnutrition moderate degree, continue supplemental feeding  Code Status: Full  Family Communication: Friend at bedside  Disposition Plan: Inpatient   Consultants:  Neurology  Interventional radiology  Procedures:  None but planned  Antimicrobials:  None  DVT prophylaxis: SCD   Objective: Vitals:   02/18/18 1126 02/18/18 1653  BP: (!) 132/96 117/78  Pulse: (!) 102 (!) 104  Resp: (!) 21 (!) 21  Temp:  98 F (36.7 C)  SpO2: 98% 98%    Intake/Output Summary (Last 24 hours) at 02/18/2018 1850 Last data filed at 02/18/2018 1320 Gross per 24 hour  Intake 360 ml  Output -  Net 360 ml   Filed Weights   02/15/18 2300 02/16/18 0500  Weight: 91 kg 91 kg   Body mass index is 32.38 kg/m.  Exam:   General: Alert oriented x3 slightly obese    Cardiovascular: Heart rate regular rate and rhythm  Respiratory: CTA bilaterally effort is normal  Abdomen: Soft full nontender no hepatosplenomegaly normoactive bowel sounds  Musculoskeletal: Moves all 4 extremities.  No lower extremity edema  Skin: No rash warm and dry  Psychiatry: Pleasant mood and affect appropriate   Data Reviewed: CBC: Recent Labs  Lab 02/15/18 1025 02/15/18 1656 02/16/18 0304 02/17/18 1905 02/18/18 0256  WBC 10.2 9.7 5.8  --  10.8*  NEUTROABS 9.0* 7.8* 3.2  --   --   HGB 13.2 13.2 11.9* 12.6 14.1  HCT 39.7 40.8 37.2 37.0 44.8  MCV 88.5 91.1 91.4  --  91.6  PLT 304.0 305 276  --  662   Basic Metabolic Panel: Recent Labs  Lab 02/15/18 1025 02/15/18 1656 02/16/18 0304 02/17/18 1905 02/18/18 0256  NA 140 140 138 140 142  K 3.8 4.0 3.5 4.0 4.1  CL 102 103 100 107 111  CO2 28 27 28   --  24  GLUCOSE 143* 183* 81 87 86  BUN 10 7 8  5* 6  CREATININE 0.72 0.69 0.67 0.70 0.66  CALCIUM 10.0 9.8 9.5  --  9.5   GFR: Estimated Creatinine Clearance: 126.4 mL/min (by C-G formula based on SCr of 0.66 mg/dL). Liver Function Tests: Recent Labs  Lab 02/15/18 1025 02/15/18 1656 02/16/18 0304  AST 28 43* 32  ALT 27 38 29  ALKPHOS 44 49 38  BILITOT 0.4 0.5 0.5  PROT 7.1 6.7 6.8  ALBUMIN 4.4 4.0 3.6   No results for input(s): LIPASE, AMYLASE in the  last 168 hours. No results for input(s): AMMONIA in the last 168 hours. Coagulation Profile: Recent Labs  Lab 02/16/18 0902  INR 1.01   Cardiac Enzymes: No results for input(s): CKTOTAL, CKMB, CKMBINDEX, TROPONINI in the last 168 hours. BNP (last 3 results) No results for input(s): PROBNP in the last 8760 hours. HbA1C: No results for input(s): HGBA1C in the last 72 hours. CBG: No results for input(s): GLUCAP in the last 168 hours. Lipid Profile: No results for input(s): CHOL, HDL, LDLCALC, TRIG, CHOLHDL, LDLDIRECT in the last 72 hours. Thyroid Function Tests: Recent Labs    02/16/18 0304   TSH 1.843   Anemia Panel: No results for input(s): VITAMINB12, FOLATE, FERRITIN, TIBC, IRON, RETICCTPCT in the last 72 hours. Urine analysis:    Component Value Date/Time   COLORURINE STRAW (A) 08/21/2017 1800   APPEARANCEUR CLEAR 08/21/2017 1800   LABSPEC 1.005 08/21/2017 1800   PHURINE 6.0 08/21/2017 1800   GLUCOSEU NEGATIVE 08/21/2017 1800   HGBUR NEGATIVE 08/21/2017 1800   BILIRUBINUR NEGATIVE 08/21/2017 1800   KETONESUR NEGATIVE 08/21/2017 1800   PROTEINUR NEGATIVE 08/21/2017 1800   NITRITE NEGATIVE 08/21/2017 1800   LEUKOCYTESUR NEGATIVE 08/21/2017 1800   Sepsis Labs: @LABRCNTIP (procalcitonin:4,lacticidven:4)  ) Recent Results (from the past 240 hour(s))  MRSA PCR Screening     Status: None   Collection Time: 02/15/18 11:28 PM  Result Value Ref Range Status   MRSA by PCR NEGATIVE NEGATIVE Final    Comment:        The GeneXpert MRSA Assay (FDA approved for NASAL specimens only), is one component of a comprehensive MRSA colonization surveillance program. It is not intended to diagnose MRSA infection nor to guide or monitor treatment for MRSA infections. Performed at [facility], [place] [address], [place], [place] [address]       Studies: No results found.  Scheduled Meds: . calcium carbonate      . calcium carbonate  500 mg Oral BID  . diphenhydrAMINE  25 mg Oral Daily  . feeding supplement  1 Container Oral TID BM  . LORazepam  0.5 mg Intravenous Once  . multivitamin with minerals  1 tablet Oral Daily  . mycophenolate  500 mg Oral BID  . predniSONE  60 mg Oral Q breakfast  . pyridostigmine  90 mg Oral 5 X Daily    Continuous Infusions: . citrate dextrose       LOS: 3 days     Cristal Deer, MD [facility]  To reach me or the doctor on call, go to: [email or web address] Password [email or web address]  02/18/2018, 6:50 PM

## 2018-02-18 NOTE — Progress Notes (Signed)
RT NOTE:  NIF: -45 VC: 0.8 L  Good patient effort. Patient did report being very tired.

## 2018-02-18 NOTE — Progress Notes (Signed)
NIF of -45 VC of 1.4L  Both with good patient effort.

## 2018-02-19 ENCOUNTER — Inpatient Hospital Stay (HOSPITAL_COMMUNITY): Payer: Managed Care, Other (non HMO)

## 2018-02-19 DIAGNOSIS — M7989 Other specified soft tissue disorders: Secondary | ICD-10-CM

## 2018-02-19 LAB — POCT I-STAT, CHEM 8
BUN: 6 mg/dL (ref 6–20)
CALCIUM ION: 1.24 mmol/L (ref 1.15–1.40)
CHLORIDE: 105 mmol/L (ref 98–111)
Creatinine, Ser: 0.8 mg/dL (ref 0.44–1.00)
GLUCOSE: 85 mg/dL (ref 70–99)
HCT: 41 % (ref 36.0–46.0)
Hemoglobin: 13.9 g/dL (ref 12.0–15.0)
Potassium: 3.3 mmol/L — ABNORMAL LOW (ref 3.5–5.1)
Sodium: 142 mmol/L (ref 135–145)
TCO2: 25 mmol/L (ref 22–32)

## 2018-02-19 LAB — CBC
HCT: 42.7 % (ref 36.0–46.0)
Hemoglobin: 13.8 g/dL (ref 12.0–15.0)
MCH: 29.4 pg (ref 26.0–34.0)
MCHC: 32.3 g/dL (ref 30.0–36.0)
MCV: 91 fL (ref 78.0–100.0)
Platelets: 234 10*3/uL (ref 150–400)
RBC: 4.69 MIL/uL (ref 3.87–5.11)
RDW: 14.5 % (ref 11.5–15.5)
WBC: 9.5 10*3/uL (ref 4.0–10.5)

## 2018-02-19 LAB — CBC WITH DIFFERENTIAL/PLATELET
Abs Immature Granulocytes: 0 10*3/uL (ref 0.0–0.1)
Basophils Absolute: 0.1 10*3/uL (ref 0.0–0.1)
Basophils Relative: 1 %
EOS ABS: 0.1 10*3/uL (ref 0.0–0.7)
Eosinophils Relative: 1 %
HEMATOCRIT: 43.4 % (ref 36.0–46.0)
Hemoglobin: 13.8 g/dL (ref 12.0–15.0)
Immature Granulocytes: 0 %
LYMPHS ABS: 4 10*3/uL (ref 0.7–4.0)
LYMPHS PCT: 41 %
MCH: 29 pg (ref 26.0–34.0)
MCHC: 31.8 g/dL (ref 30.0–36.0)
MCV: 91.2 fL (ref 78.0–100.0)
Monocytes Absolute: 0.7 10*3/uL (ref 0.1–1.0)
Monocytes Relative: 7 %
NEUTROS PCT: 50 %
Neutro Abs: 4.9 10*3/uL (ref 1.7–7.7)
Platelets: 243 10*3/uL (ref 150–400)
RBC: 4.76 MIL/uL (ref 3.87–5.11)
RDW: 14.4 % (ref 11.5–15.5)
WBC: 9.7 10*3/uL (ref 4.0–10.5)

## 2018-02-19 LAB — BASIC METABOLIC PANEL
Anion gap: 9 (ref 5–15)
BUN: 5 mg/dL — ABNORMAL LOW (ref 6–20)
CALCIUM: 9.6 mg/dL (ref 8.9–10.3)
CO2: 27 mmol/L (ref 22–32)
Chloride: 104 mmol/L (ref 98–111)
Creatinine, Ser: 0.78 mg/dL (ref 0.44–1.00)
GFR calc non Af Amer: >60 mL/min (ref >60)
GLUCOSE: 134 mg/dL — AB (ref 70–99)
Potassium: 3.5 mmol/L (ref 3.5–5.1)
Sodium: 140 mmol/L (ref 135–145)

## 2018-02-19 LAB — GLUCOSE, CAPILLARY: Glucose-Capillary: 129 mg/dL — ABNORMAL HIGH (ref 70–99)

## 2018-02-19 MED ORDER — HEPARIN SODIUM (PORCINE) 1000 UNIT/ML IJ SOLN: 1000.0000 [IU] | Freq: Once | INTRAMUSCULAR | Status: DC

## 2018-02-19 MED ORDER — DIPHENHYDRAMINE HCL 25 MG PO CAPS: 25.0000 mg | ORAL_CAPSULE | Freq: Four times a day (QID) | ORAL | Status: DC | PRN

## 2018-02-19 MED ORDER — SODIUM CHLORIDE 0.9 % IV SOLN
INTRAVENOUS | Status: AC
  Administered 2018-02-19 (×3): via INTRAVENOUS_CENTRAL
  Filled 2018-02-19: qty 200
  Filled 2018-02-19: qty 100
  Filled 2018-02-19: qty 200

## 2018-02-19 MED ORDER — ONDANSETRON HCL 4 MG/2ML IJ SOLN
4.0000 mg | Freq: Four times a day (QID) | INTRAMUSCULAR | Status: DC | PRN
  Administered 2018-02-19 (×2): 4 mg via INTRAVENOUS
  Filled 2018-02-19 (×2): qty 2

## 2018-02-19 MED ORDER — CALCIUM CARBONATE ANTACID 500 MG PO CHEW
CHEWABLE_TABLET | ORAL | Status: AC
  Administered 2018-02-19: 400 mg via ORAL
  Filled 2018-02-19: qty 2

## 2018-02-19 MED ORDER — ENOXAPARIN SODIUM 40 MG/0.4ML ~~LOC~~ SOLN
40.0000 mg | SUBCUTANEOUS | Status: DC
  Administered 2018-02-19 – 2018-02-24 (×6): 40 mg via SUBCUTANEOUS
  Filled 2018-02-19 (×7): qty 0.4

## 2018-02-19 MED ORDER — ACETAMINOPHEN 325 MG PO TABS
650.0000 mg | ORAL_TABLET | ORAL | Status: DC | PRN
  Administered 2018-02-19: 650 mg via ORAL

## 2018-02-19 MED ORDER — CALCIUM CARBONATE ANTACID 500 MG PO CHEW: 2.0000 | CHEWABLE_TABLET | ORAL | Status: AC

## 2018-02-19 MED ORDER — ACD FORMULA A 0.73-2.45-2.2 GM/100ML VI SOLN
500.0000 mL | Status: DC
  Administered 2018-02-19: 500 mL via INTRAVENOUS
  Filled 2018-02-19: qty 500

## 2018-02-19 MED ORDER — CALCIUM GLUCONATE 10 % IV SOLN
INTRAVENOUS | Status: AC
  Administered 2018-02-19: 4.65 meq
  Filled 2018-02-19: qty 10

## 2018-02-19 MED ORDER — METOPROLOL TARTRATE 12.5 MG HALF TABLET
12.5000 mg | ORAL_TABLET | Freq: Two times a day (BID) | ORAL | Status: DC
  Filled 2018-02-19: qty 1

## 2018-02-19 MED ORDER — SODIUM CHLORIDE 0.9 % IV SOLN
2.0000 g | Freq: Once | INTRAVENOUS | Status: AC
  Administered 2018-02-19: 2 g via INTRAVENOUS
  Filled 2018-02-19: qty 20

## 2018-02-19 NOTE — Progress Notes (Signed)
Shift event: NP called by colleague at [facility] because RN paged that pt had a low BP and loss of vision. Presently, pt is receiving a 1L bolus. This NP went to bedside. S: Pt states she got up to go to the bathroom and suddenly felt nauseated and light headed. She went back to bed, but then her vision went "black". After 1/2 L bolus and pt getting back in bed, she already feels better and is back to baseline. No CP or SOB.  O: Pt appears well and is in NAD. BP 70/50 when first assessed by RN. BP now 109. Hr 80s. RR 18 O2 sat upper 90s. RRR. Respiratory effort is normal. Neuro is focally intact.  A/P 1. Hypotension-likely orthostatic with vaso vagal event when standing up suddenly and walking. BP better and pt feels much better. Her vision has returned to normal. Continue to finish bolus. Discussed with pt safety precautions. Asked her to call for help before getting OOB and sit for awhile, then stand for awhile, then walk to help prevent drop in BP. She verbalizes understanding.  KJKG, NP [facility]  Total critical care time: 30 minutes Critical care time was exclusive of separately billable procedures and treating other patients. Critical care was necessary to treat or prevent imminent or life-threatening deterioration. Critical care was time spent personally by me on the following activities: development of treatment plan with patient and/or surrogate as well as nursing, discussions with consultants, evaluation of patient's response to treatment, examination of patient, obtaining history from patient or surrogate, ordering and performing treatments and interventions, ordering and review of laboratory studies, ordering and review of radiographic studies, pulse oximetry and re-evaluation of patient's condition.

## 2018-02-19 NOTE — Progress Notes (Signed)
Patient is continuieng to improve, BP is now 105/90 and HR is 74. Dr Baltazar Najjar came into patient room and talked with patient, patient's mother and her close friend. Patient says she feels much better. She says she has a "little bit of a head ache". Will continue to closely monitor.

## 2018-02-19 NOTE — Progress Notes (Signed)
Bilateral lower extremity venous duplex has been completed. Negative for DVT.  02/19/18 10:53 AM Suzanne Nguyen RVT

## 2018-02-19 NOTE — Progress Notes (Signed)
PROGRESS NOTE        PATIENT DETAILS Name: Suzanne Nguyen Age: 22 y.o. Sex: female Date of Birth: Dec 29, 1995 Admit Date: 02/15/2018 Admitting Physician Rise Patience, MD IDP:[id number], No Pcp Per  Brief Narrative: Patient is a 22 y.o. female with history of seropositive myasthenia gravis-status post thymectomy July 2019 presenting with generalized weakness and diplopia, thought to have myasthenia gravis crisis.  Slowly improving with plasmapheresis.  See below for further details  Subjective: Feels better-thinks that her weakness has somewhat improved.  Assessment/Plan: Myasthenia gravis in crisis: Improved-neurology plans to complete 5 doses of plasma exchange every other day-second dose to be done on 9/17.  Continue Mestinon, CellCept and prednisone.  Neurology following.  Sinus tachycardia: TSH within normal limits-not hypoxic-Dopplers negative-therefore doubt VTE at this point.  Start low-dose metoprolol and follow.  Could be secondary to deconditioning and acute illness-it is mostly with movement/ambulation.  Malnutrition of moderate degree: Continue supplements.  DVT Prophylaxis: Begin prophylactic Lovenox   Code Status: Full code   Family Communication: None at bedside  Disposition Plan: Remain inpatient-home when patient completes a course of plasmapheresis.  Antimicrobial agents: Anti-infectives (From admission, onward)   Start     Dose/Rate Route Frequency Ordered Stop   02/17/18 0905  ceFAZolin (ANCEF) IVPB 1 g/50 mL premix     over 30 Minutes Intravenous Continuous PRN 02/17/18 0938 02/17/18 0905   02/17/18 0856  ceFAZolin (ANCEF) 2-4 GM/100ML-% IVPB    Note to Pharmacy:  Margaretmary Dys   : cabinet override      02/17/18 0856 02/17/18 2059      Procedures: 9/15>> tunneled HD catheter  CONSULTS:  neurology and IR  Time spent: 25 minutes-Greater than 50% of this time was spent in counseling, explanation of diagnosis,  planning of further management, and coordination of care.  MEDICATIONS: Scheduled Meds: . calcium carbonate  2 tablet Oral Q3H  . calcium carbonate  500 mg Oral BID  . diphenhydrAMINE  25 mg Oral Daily  . feeding supplement  1 Container Oral TID BM  . heparin  1,000 Units Intracatheter Once  . LORazepam  0.5 mg Intravenous Once  . metoprolol tartrate  12.5 mg Oral BID  . multivitamin with minerals  1 tablet Oral Daily  . mycophenolate  500 mg Oral BID  . predniSONE  60 mg Oral Q breakfast  . pyridostigmine  90 mg Oral 5 X Daily   Continuous Infusions: . therapeutic plasma exchange solution    . calcium gluconate IVPB    . citrate dextrose     PRN Meds:.acetaminophen **OR** acetaminophen, acetaminophen, diphenhydrAMINE   PHYSICAL EXAM: Vital signs: Vitals:   02/18/18 2045 02/19/18 0439 02/19/18 0845 02/19/18 1140  BP: (!) 125/96 135/80 (!) 135/99 126/78  Pulse: (!) 113 (!) 106 (!) 105   Resp: (!) 31 (!) 21 (!) 26 (!) 21  Temp:  98.3 F (36.8 C) 98.3 F (36.8 C) 97.8 F (36.6 C)  TempSrc:  Oral Oral Oral  SpO2: 99% 97% 97% 98%  Weight:      Height:       Filed Weights   02/15/18 2300 02/16/18 0500  Weight: 91 kg 91 kg   Body mass index is 32.38 kg/m.   General appearance :Awake, alert, not in any distress. Speech Clear. Not toxic Looking Eyes:Pink conjunctiva HEENT: Atraumatic and Normocephalic Neck: supple, no JVD.  No cervical lymphadenopathy. No thyromegaly Resp:Good air entry bilaterally, no added sounds  CVS: S1 S2 regular, no murmurs.  GI: Bowel sounds present, Non tender and not distended with no gaurding, rigidity or rebound.No organomegaly Extremities: B/L Lower Ext shows no edema, both legs are warm to touch Neurology: Moves all 4 extremities-strength seems to be adequate. Psychiatric: Normal judgment and insight. Alert and oriented x 3. Normal mood. Musculoskeletal:No digital cyanosis Skin:No Rash, warm and dry Wounds:N/A  I have personally  reviewed following labs and imaging studies  LABORATORY DATA: CBC: Recent Labs  Lab 02/15/18 1025 02/15/18 1656 02/16/18 0304 02/17/18 1905 02/18/18 0256 02/19/18 0953  WBC 10.2 9.7 5.8  --  10.8* 9.7  NEUTROABS 9.0* 7.8* 3.2  --   --  4.9  HGB 13.2 13.2 11.9* 12.6 14.1 13.8  HCT 39.7 40.8 37.2 37.0 44.8 43.4  MCV 88.5 91.1 91.4  --  91.6 91.2  PLT 304.0 305 276  --  266 161    Basic Metabolic Panel: Recent Labs  Lab 02/15/18 1025 02/15/18 1656 02/16/18 0304 02/17/18 1905 02/18/18 0256 02/19/18 0953  NA 140 140 138 140 142 140  K 3.8 4.0 3.5 4.0 4.1 3.5  CL 102 103 100 107 111 104  CO2 _0 --  24 27  GLUCOSE 143* 183* 81 87 86 134*  BUN _1 5* 6 5*  CREATININE 0.72 0.69 0.67 0.70 0.66 0.78  CALCIUM 10.0 9.8 9.5  --  9.5 9.6    GFR: Estimated Creatinine Clearance: 126.4 mL/min (by C-G formula based on SCr of 0.78 mg/dL).  Liver Function Tests: Recent Labs  Lab 02/15/18 1025 02/15/18 1656 02/16/18 0304  AST 28 43* 32  ALT 27 38 29  ALKPHOS 44 49 38  BILITOT 0.4 0.5 0.5  PROT 7.1 6.7 6.8  ALBUMIN 4.4 4.0 3.6   No results for input(s): LIPASE, AMYLASE in the last 168 hours. No results for input(s): AMMONIA in the last 168 hours.  Coagulation Profile: Recent Labs  Lab 02/16/18 0902  INR 1.01    Cardiac Enzymes: No results for input(s): CKTOTAL, CKMB, CKMBINDEX, TROPONINI in the last 168 hours.  BNP (last 3 results) No results for input(s): PROBNP in the last 8760 hours.  HbA1C: No results for input(s): HGBA1C in the last 72 hours.  CBG: No results for input(s): GLUCAP in the last 168 hours.  Lipid Profile: No results for input(s): CHOL, HDL, LDLCALC, TRIG, CHOLHDL, LDLDIRECT in the last 72 hours.  Thyroid Function Tests: No results for input(s): TSH, T4TOTAL, FREET4, T3FREE, THYROIDAB in the last 72 hours.  Anemia Panel: No results for input(s): VITAMINB12, FOLATE, FERRITIN, TIBC, IRON, RETICCTPCT in the last 72 hours.  Urine  analysis:    Component Value Date/Time   COLORURINE STRAW (A) 08/21/2017 1800   APPEARANCEUR CLEAR 08/21/2017 1800   LABSPEC 1.005 08/21/2017 1800   PHURINE 6.0 08/21/2017 1800   GLUCOSEU NEGATIVE 08/21/2017 1800   HGBUR NEGATIVE 08/21/2017 1800   BILIRUBINUR NEGATIVE 08/21/2017 1800   KETONESUR NEGATIVE 08/21/2017 1800   PROTEINUR NEGATIVE 08/21/2017 1800   NITRITE NEGATIVE 08/21/2017 1800   LEUKOCYTESUR NEGATIVE 08/21/2017 1800    Sepsis Labs: Lactic Acid, Venous No results found for: LATICACIDVEN  MICROBIOLOGY: Recent Results (from the past 240 hour(s))  MRSA PCR Screening     Status: None   Collection Time: 02/15/18 11:28 PM  Result Value Ref Range Status   MRSA by PCR NEGATIVE NEGATIVE Final    Comment:  The GeneXpert MRSA Assay (FDA approved for NASAL specimens only), is one component of a comprehensive MRSA colonization surveillance program. It is not intended to diagnose MRSA infection nor to guide or monitor treatment for MRSA infections. Performed at [facility], [place] [address] [place], [place] [address]     RADIOLOGY STUDIES/RESULTS: Ir Cyndy Freeze Guide Cv Line Right  Result Date: 02/17/2018 INDICATION: History of myasthenia gravis, in need of durable intravenous access for plasmapheresis. EXAM: TUNNELED CENTRAL VENOUS HEMODIALYSIS CATHETER PLACEMENT WITH ULTRASOUND AND FLUOROSCOPIC GUIDANCE MEDICATIONS: Ancef 2 gm IV . The antibiotic was given in an appropriate time interval prior to skin puncture. ANESTHESIA/SEDATION: Versed 3 mg IV; Fentanyl 100 mcg IV; Moderate Sedation Time:  15 minutes The patient was continuously monitored during the procedure by the interventional radiology nurse under my direct supervision. FLUOROSCOPY TIME:  30 seconds (6 mGy) COMPLICATIONS: None immediate. PROCEDURE: Informed written consent was obtained from the patient after a discussion of the risks, benefits, and alternatives to treatment. Questions regarding the  procedure were encouraged and answered. The right neck and chest were prepped with chlorhexidine in a sterile fashion, and a sterile drape was applied covering the operative field. Maximum barrier sterile technique with sterile gowns and gloves were used for the procedure. A timeout was performed prior to the initiation of the procedure. After creating a small venotomy incision, a micropuncture kit was utilized to access the internal jugular vein. Real-time ultrasound guidance was utilized for vascular access including the acquisition of a permanent ultrasound image documenting patency of the accessed vessel. The microwire was utilized to measure appropriate catheter length. A stiff Glidewire was advanced to the level of the IVC and the micropuncture sheath was exchanged for a peel-away sheath. A palindrome tunneled hemodialysis catheter measuring 19 cm from tip to cuff was tunneled in a retrograde fashion from the anterior chest wall to the venotomy incision. The catheter was then placed through the peel-away sheath with tips ultimately positioned within the superior aspect of the right atrium. Final catheter positioning was confirmed and documented with a spot radiographic image. The catheter aspirates and flushes normally. The catheter was flushed with appropriate volume heparin dwells. The catheter exit site was secured with a 0-Prolene retention suture. The venotomy incision was closed with Dermabond and Steri-strips. Dressings were applied. The patient tolerated the procedure well without immediate post procedural complication. IMPRESSION: Successful placement of 19 cm tip to cuff tunneled hemodialysis catheter via the right internal jugular vein with tips terminating within the superior aspect of the right atrium. The catheter is ready for immediate use. Electronically Signed   By: Sandi Mariscal M.D.   On: 02/17/2018 10:37   Ir US Guide Vasc Access Right  Result Date: 02/17/2018 INDICATION: History of  myasthenia gravis, in need of durable intravenous access for plasmapheresis. EXAM: TUNNELED CENTRAL VENOUS HEMODIALYSIS CATHETER PLACEMENT WITH ULTRASOUND AND FLUOROSCOPIC GUIDANCE MEDICATIONS: Ancef 2 gm IV . The antibiotic was given in an appropriate time interval prior to skin puncture. ANESTHESIA/SEDATION: Versed 3 mg IV; Fentanyl 100 mcg IV; Moderate Sedation Time:  15 minutes The patient was continuously monitored during the procedure by the interventional radiology nurse under my direct supervision. FLUOROSCOPY TIME:  30 seconds (6 mGy) COMPLICATIONS: None immediate. PROCEDURE: Informed written consent was obtained from the patient after a discussion of the risks, benefits, and alternatives to treatment. Questions regarding the procedure were encouraged and answered. The right neck and chest were prepped with chlorhexidine in a sterile fashion, and a sterile  drape was applied covering the operative field. Maximum barrier sterile technique with sterile gowns and gloves were used for the procedure. A timeout was performed prior to the initiation of the procedure. After creating a small venotomy incision, a micropuncture kit was utilized to access the internal jugular vein. Real-time ultrasound guidance was utilized for vascular access including the acquisition of a permanent ultrasound image documenting patency of the accessed vessel. The microwire was utilized to measure appropriate catheter length. A stiff Glidewire was advanced to the level of the IVC and the micropuncture sheath was exchanged for a peel-away sheath. A palindrome tunneled hemodialysis catheter measuring 19 cm from tip to cuff was tunneled in a retrograde fashion from the anterior chest wall to the venotomy incision. The catheter was then placed through the peel-away sheath with tips ultimately positioned within the superior aspect of the right atrium. Final catheter positioning was confirmed and documented with a spot radiographic image. The  catheter aspirates and flushes normally. The catheter was flushed with appropriate volume heparin dwells. The catheter exit site was secured with a 0-Prolene retention suture. The venotomy incision was closed with Dermabond and Steri-strips. Dressings were applied. The patient tolerated the procedure well without immediate post procedural complication. IMPRESSION: Successful placement of 19 cm tip to cuff tunneled hemodialysis catheter via the right internal jugular vein with tips terminating within the superior aspect of the right atrium. The catheter is ready for immediate use. Electronically Signed   By: Sandi Mariscal M.D.   On: 02/17/2018 10:37   Dg Chest Port 1 View  Result Date: 02/16/2018 CLINICAL DATA:  Shortness of breath.  History of bronchitis. EXAM: PORTABLE CHEST 1 VIEW COMPARISON:  None. FINDINGS: Cardiomediastinal silhouette is unremarkable for this low inspiratory examination with crowded vasculature markings. Elevated LEFT hemidiaphragm. The lungs are clear without pleural effusions or focal consolidations. Trachea projects midline and there is no pneumothorax. Included soft tissue planes and osseous structures are non-suspicious. IMPRESSION: 1. No acute cardiopulmonary process. 2. Elevated LEFT hemidiaphragm. Electronically Signed   By: Elon Alas M.D.   On: 02/16/2018 06:37     LOS: 4 days   Oren Binet, MD  [facility]  If 7PM-7AM, please contact night-coverage  Please page via [email or web address]-Password [email or web address]-click on MD name and type text message  02/19/2018, 12:15 PM

## 2018-02-19 NOTE — Progress Notes (Signed)
Pt performed VC of 1.4 L and NIF of -24

## 2018-02-19 NOTE — Progress Notes (Signed)
NIF: -45 VC: 1.5L  Both performed with good patient effort.

## 2018-02-19 NOTE — Progress Notes (Signed)
Upon shift change, both RNs entered room to find pt c/o nausea sitting on edge of bed and pt to be diaphoretic.  Pt HR jumping to 140s then down to 70s. Pt then stating "I just don't feel well. Please help me. I can't see anything now." BP low, attempted to take manually by 2 RNs. MBP was found to be 70s over 50s. Auto BP 45/76. Called for help to get pt to lay down and to obtain CBG. MD paged.  Order for 1L bolus obtained and started.  BP improving after 10 min at 95/74 HR is 110. Pt says that she can now see the nurses and her color is less pale. Patient denies blurry vision.

## 2018-02-19 NOTE — Progress Notes (Signed)
Patient returned from dialysis for plasma exchange. Vitals stable. Patient w/o complaints other than feeling tired. Continue to monitor.

## 2018-02-20 ENCOUNTER — Telehealth: Payer: Self-pay | Admitting: *Deleted

## 2018-02-20 LAB — BASIC METABOLIC PANEL
ANION GAP: 10 (ref 5–15)
BUN: 5 mg/dL — AB (ref 6–20)
CHLORIDE: 107 mmol/L (ref 98–111)
CO2: 24 mmol/L (ref 22–32)
Calcium: 9.6 mg/dL (ref 8.9–10.3)
Creatinine, Ser: 0.59 mg/dL (ref 0.44–1.00)
GFR calc Af Amer: >60 mL/min (ref >60)
GLUCOSE: 128 mg/dL — AB (ref 70–99)
POTASSIUM: 4.6 mmol/L (ref 3.5–5.1)
Sodium: 141 mmol/L (ref 135–145)

## 2018-02-20 LAB — CBC
HEMATOCRIT: 44.4 % (ref 36.0–46.0)
HEMOGLOBIN: 14.5 g/dL (ref 12.0–15.0)
MCH: 29.4 pg (ref 26.0–34.0)
MCHC: 32.7 g/dL (ref 30.0–36.0)
MCV: 90.1 fL (ref 78.0–100.0)
Platelets: 201 10*3/uL (ref 150–400)
RBC: 4.93 MIL/uL (ref 3.87–5.11)
RDW: 14.4 % (ref 11.5–15.5)
WBC: 11.1 10*3/uL — ABNORMAL HIGH (ref 4.0–10.5)

## 2018-02-20 MED ORDER — METOPROLOL TARTRATE 25 MG PO TABS
25.0000 mg | ORAL_TABLET | Freq: Two times a day (BID) | ORAL | Status: DC
  Administered 2018-02-20: 12.5 mg via ORAL
  Administered 2018-02-21: 25 mg via ORAL
  Filled 2018-02-20 (×3): qty 1

## 2018-02-20 NOTE — Progress Notes (Signed)
Reinforced Dr Anson Crofts teaching patient safety precautions and slow to rise from lying and sitting positions. Patient and family both verb understanding. Patient is placed in fall prec. Will continue to monitor.

## 2018-02-20 NOTE — Progress Notes (Signed)
PROGRESS NOTE    Patient: Suzanne Nguyen                            PCP: Patient, No Pcp Per                    DOB: 07/30/1995            DOA: 02/15/2018 ZYS:[id number]             DOS: 02/20/2018, 12:37 PM   LOS: 5 days   Date of Service: The patient was seen and examined on 02/20/2018  Subjective:   She was seen and examined this morning, stable.  Not complaining of any headaches visual changes or asymmetric weaknesses.  Still reports of periodic double vision.  Improved weakness  Overnight patient apparently had an episode of hypotension and loss of vision.  As patient got up to use the bathroom.  Patient stated suddenly she felt nauseated lightheaded she returned back to her bed briefly noticed everything went black. She subsequently has responded to IV fluid hydration 500 mL blood pressure stabilized  This morning blood pressure was stable at 142/86, tachycardic with a heart rate of 122. He was found comfortable in sitting in a chair with no complaints.  Brief Narrative:  Patient is a 22 y.o. female with history of seropositive myasthenia gravis-status post thymectomy July 2019 presenting with generalized weakness and diplopia, thought to have myasthenia gravis crisis.  Slowly improving with plasmapheresis.  See below for further details   Principal Problem:   Myasthenia gravis in crisis [facility]) Active Problems:   Polycystic ovarian syndrome   S/P thymectomy   Acute respiratory failure (HCC)   Malnutrition of moderate degree    Assessment & Plan:   Myasthenia gravis in crisis:  -Times are improving, still reporting of periodic double vision, improved weaknesses -Neurology following very closely to complete 5 rounds of plasma exchange every other day Second dose was on 9/17 (treatment around 2/5) -Continue to be remain on Continue Mestinon, CellCept and prednisone.  Neurology following.  Orthostatic hypotension -Currently stable blood pressure mildly  elevated, -Spotted well to IV fluid hydration -Encourage p.o. hydration  Sinus tachycardia: T SH within normal limits-not hypoxic -Dopplers negative-therefore doubt VTE at this point.   -Patient was started on metoprolol low-dose 12.5 mg, still tachycardic this morning, was increased to 25 mg but patient has took 12.5 mg.  We will continue to monitor  Could be secondary to deconditioning and acute illness-it is mostly with movement/ambulation.  Malnutrition of moderate degree: Continue supplements.  Brief episode of orthostatic hypotension  DVT Prophylaxis: Begin prophylactic Lovenox   Code Status: Full code   Family Communication: None at bedside  Disposition Plan: Remain inpatient -home when patient completes a course of plasmapheresis.   Procedures: 9/15>> tunneled HD catheter  CONSULTS:  Neurology and IR   Procedures:   No admission procedures for hospital encounter.   Antimicrobials:  Anti-infectives (From admission, onward)   Start     Dose/Rate Route Frequency Ordered Stop   02/17/18 0905  ceFAZolin (ANCEF) IVPB 1 g/50 mL premix     over 30 Minutes Intravenous Continuous PRN 02/17/18 0938 02/17/18 0905   02/17/18 0856  ceFAZolin (ANCEF) 2-4 GM/100ML-% IVPB    Note to Pharmacy:  Margaretmary Dys   : cabinet override      02/17/18 0856 02/17/18 2059      Current medications:  . calcium carbonate  500  mg Oral BID  . diphenhydrAMINE  25 mg Oral Daily  . enoxaparin (LOVENOX) injection  40 mg Subcutaneous Q24H  . feeding supplement  1 Container Oral TID BM  . heparin  1,000 Units Intracatheter Once  . LORazepam  0.5 mg Intravenous Once  . metoprolol tartrate  25 mg Oral BID  . multivitamin with minerals  1 tablet Oral Daily  . mycophenolate  500 mg Oral BID  . predniSONE  60 mg Oral Q breakfast  . pyridostigmine  90 mg Oral 5 X Daily     Objective:   Vitals:   02/20/18 0425 02/20/18 0500 02/20/18 0810 02/20/18 1145  BP:   (!) 142/86 137/80   Pulse:   (!) 120   Resp:   (!) 21 (!) 25  Temp: 97.7 F (36.5 C)  97.8 F (36.6 C) 98.1 F (36.7 C)  TempSrc: Oral  Oral Oral  SpO2:   93% 96%  Weight:  95.2 kg    Height:        Intake/Output Summary (Last 24 hours) at 02/20/2018 1237 Last data filed at 02/20/2018 0800 Gross per 24 hour  Intake 909.5 ml  Output -  Net 909.5 ml   Filed Weights   02/15/18 2300 02/16/18 0500 02/20/18 0500  Weight: 91 kg 91 kg 95.2 kg     Examination:    General exam: Appears calm and comfortable  BP 137/80 (BP Location: Right Arm)   Pulse (!) 120   Temp 98.1 F (36.7 C) (Oral)   Resp (!) 25   Ht 5\' 6"  (1.676 m)   Wt 95.2 kg   SpO2 96%   BMI 33.88 kg/m    Physical Exam  Constitution:  Alert, cooperative, no distress,  Psychiatric: Normal and stable mood and affect, cognition intact,   HEENT: Normocephalic, PERRL, otherwise with in Normal limits  Chest:Chest symmetric Cardio vascular:  S1/S2, RRR, No murmure, No Rubs or Gallops  pulmonary: Clear to auscultation bilaterally, respirations unlabored, negative wheezes / crackles Abdomen: Soft, non-tender, non-distended, bowel sounds,no masses, no organomegaly Muscular skeletal: Limited exam - in bed, able to move all 4 extremities, Normal strength,  Neuro: CNII-XII intact. , normal motor and sensation, reflexes intact  Extremities: No pitting edema lower extremities, +2 pulses  Skin: Dry, warm to touch, negative for any Rashes, No open wounds Wounds: per nursing documentation   LABs:  CBC Latest Ref Rng & Units 02/20/2018 02/19/2018 02/19/2018  WBC 4.0 - 10.5 K/uL 11.1(H) 9.5 -  Hemoglobin 12.0 - 15.0 g/dL 14.5 13.8 13.9  Hematocrit 36.0 - 46.0 % 44.4 42.7 41.0  Platelets 150 - 400 K/uL 201 234 -   CMP Latest Ref Rng & Units 02/20/2018 02/19/2018 02/19/2018  Glucose 70 - 99 mg/dL 128(H) 85 134(H)  BUN 6 - 20 mg/dL 5(L) 6 5(L)  Creatinine 0.44 - 1.00 mg/dL 0.59 0.80 0.78  Sodium 135 - 145 mmol/L 141 142 140  Potassium 3.5 - 5.1  mmol/L 4.6 3.3(L) 3.5  Chloride 98 - 111 mmol/L 107 105 104  CO2 22 - 32 mmol/L 24 - 27  Calcium 8.9 - 10.3 mg/dL 9.6 - 9.6  Total Protein 6.5 - 8.1 g/dL - - -  Total Bilirubin 0.3 - 1.2 mg/dL - - -  Alkaline Phos 38 - 126 U/L - - -  AST 15 - 41 U/L - - -  ALT 0 - 44 U/L - - -

## 2018-02-20 NOTE — Progress Notes (Signed)
This AM pt only comfortable taking 0.5 tablet  (12.5mg ) of 1tab (25mg ) ordered metoprolol. At this time, pt states she "feels calm rather than feeling HR low as with previous medications. I like that I don't feel funny when I move around now."  Pt would like to continue taking 12.5mg  (0.5 tab) metoprolol.

## 2018-02-20 NOTE — Progress Notes (Addendum)
Sent Dr Hilbert Bible and text page regarding pt refusing to take the Metoprolol.

## 2018-02-20 NOTE — Progress Notes (Addendum)
Subjective: Patient feels stronger in her upper extremities, still has double vision when looking to the left. Patient states that after her Plex yesterday she became extremely orthostatic when she got back to her room however after laying down she felt all better.  Exam: Vitals:   02/20/18 0810 02/20/18 1145  BP: (!) 142/86 137/80  Pulse: (!) 120   Resp: (!) 21 (!) 25  Temp: 97.8 F (36.6 C) 98.1 F (36.7 C)  SpO2: 93% 96%    Physical Exam   HEENT-  Normocephalic, no lesions, without obvious abnormality.  Normal external eye and conjunctiva.   Extremities- Warm, dry and intact Musculoskeletal-no joint tenderness, deformity or swelling Skin-warm and dry, no hyperpigmentation, vitiligo, or suspicious lesions    Neuro:  Mental Status: Alert, oriented, thought content appropriate.  Speech fluent without evidence of aphasia.  Able to follow 3 step commands without difficulty. Cranial Nerves: II: Visual fields grossly normal and vision is grossly normal however when she looks to the left she says she has double vision. III,IV, VI: Present bilateral eyes worse on the right than on the left, extra-ocular motions intact bilaterally-however it is noted that when she looks to the left and then comes back to the midline there is a lag in the left eye.  Pupils equal, round, reactive to light and accommodation V,VII: smile symmetric, facial light touch sensation normal bilaterally VIII: hearing normal bilaterally IX,X: uvula rises midline XI: bilateral shoulder shrug XII: midline tongue extension Motor: Right : Upper extremity   5/5    Left:     Upper extremity   5/5  Lower extremity   5/5     Lower extremity   5/5 -Bilateral abduction of arm still is 4/5 Tone and bulk:normal tone throughout; no atrophy noted Sensory: Pinprick and light touch intact throughout, bilaterally Deep Tendon Reflexes: 2+ and symmetric throughout Plantars: Right: downgoing   Left:  downgoing Cerebellar: normal finger-to-nose, normal rapid alternating movements and normal heel-to-shin test     Medications:  Scheduled: . calcium carbonate  500 mg Oral BID  . diphenhydrAMINE  25 mg Oral Daily  . enoxaparin (LOVENOX) injection  40 mg Subcutaneous Q24H  . feeding supplement  1 Container Oral TID BM  . heparin  1,000 Units Intracatheter Once  . LORazepam  0.5 mg Intravenous Once  . metoprolol tartrate  25 mg Oral BID  . multivitamin with minerals  1 tablet Oral Daily  . mycophenolate  500 mg Oral BID  . predniSONE  60 mg Oral Q breakfast  . pyridostigmine  90 mg Oral 5 X Daily   Continuous: . citrate dextrose      Pertinent Labs/Diagnostics: No pertinent labs at this time  Last respiratory tests were done on the 17th and showed a vital capacity of 1.4 L and a negative inspiratory force of -24, however today patient is clearly sitting comfortably with no respiratory distress    [name] PA-C [facility] [phone]   Assessment: 22 year old with seropositive myasthenia gravis status post thymectomy presenting with possible myasthenia crisis.  At this point she has received 2 plasma exchanges and will receive third dose tomorrow.    Recommendations: -Continue 5 complete doses of plasma exchange--02/20/2018 will be the third dose of plasma exchange--should be recommended that hemodialysis looks closely at the amount of fluid taken off as she was orthostatic after last plasma exchange - NIF/FVC's every 8 hours -Mestinon 90 mg 5 times daily -CellCept 500 mg daily -Prednisone 60 mg daily -We will  continue to follow patient    02/20/2018, 12:26 PM  NEUROHOSPITALIST ADDENDUM Performed a face to face diagnostic evaluation.   I have reviewed the contents of history and physical exam as documented by PA/ARNP/Resident and agree with above documentation.  I have discussed and formulated the above plan as documented. Edits to the note have been  made as needed.   Markable improvement after 2 doses of Plex.  Will reassess after third dose.   Karena Addison Danira Nylander MD [facility] [phone]   If 7pm to 7am, please call on call as listed on AMION.

## 2018-02-20 NOTE — Progress Notes (Signed)
Patient perform pulmonary mechanics with good effort, no distress or complications noted   NIF: > -40 FVC: 1.2L

## 2018-02-20 NOTE — Telephone Encounter (Signed)
[place]ard PA #: [id number]

## 2018-02-20 NOTE — Progress Notes (Signed)
Pharmacist came to patient room and spoke to her regarding her questions about her medications. He spoke to her about Metoprolol side effect can muscle weakness. Patient does not want her Metoprolol dose now but says she will take Metoprolol in the morning. Her HR is 80-110. Will continue to monitor.

## 2018-02-20 NOTE — Evaluation (Signed)
Physical Therapy Evaluation Patient Details Name: Suzanne Nguyen MRN: [id number] DOB: 05-17-1996 Today's Date: 02/20/2018   History of Present Illness  Patient is a 22 y.o. female with history of seropositive myasthenia gravis-status post thymectomy July 2019 presenting with generalized weakness and diplopia, thought to have myasthenia gravis crisis.  Slowly improving with plasmapheresis  Clinical Impression  Pt admitted with/for MG crisis.  Pt improving well and now mobilizes with supervision level or better..  Pt currently limited functionally due to the problems listed below.  (see problems list.)  Pt will benefit from PT to maximize function and safety to be able to get home safely..     Follow Up Recommendations No PT follow up    Equipment Recommendations  None recommended by PT    Recommendations for Other Services       Precautions / Restrictions Precautions Precautions: Fall      Mobility  Bed Mobility               General bed mobility comments: OOB in the chair on arrival  Transfers Overall transfer level: Needs assistance   Transfers: Sit to/from Stand Sit to Stand: Supervision;Modified independent (Device/Increase time)            Ambulation/Gait Ambulation/Gait assistance: Supervision Gait Distance (Feet): 200 Feet Assistive device: Rolling walker (2 wheeled) Gait Pattern/deviations: Step-through pattern   Gait velocity interpretation: 1.31 - 2.62 ft/sec, indicative of limited community ambulator General Gait Details: generally steady, though with episodes of incoordination.  Stairs Stairs: Yes Stairs assistance: Supervision Stair Management: One rail Right Number of Stairs: 24 General stair comments: With use of a rail, pt appears confident in tackling the steps.  She had to stop for rest as EHR increased to 150's bpm  Wheelchair Mobility    Modified Rankin (Stroke Patients Only)       Balance Overall balance assessment: Mild  deficits observed, not formally tested                                           Pertinent Vitals/Pain Pain Assessment: Faces Faces Pain Scale: No hurt    Home Living Family/patient expects to be discharged to:: Private residence Living Arrangements: Non-relatives/Friends Available Help at Discharge: Friend(s);Available PRN/intermittently;Family Type of Home: Other(Comment)(dorm) Home Access: Stairs to enter Entrance Stairs-Rails: Psychiatric nurse of Steps: 3 flights Home Layout: One level Home Equipment: None      Prior Function Level of Independence: Independent         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      Hand Dominance   Dominant Hand: Right    Extremity/Trunk Assessment   Upper Extremity Assessment Upper Extremity Assessment: Overall WFL for tasks assessed    Lower Extremity Assessment Lower Extremity Assessment: Overall WFL for tasks assessed(with general weakness bilaterally, but functional)       Communication   Communication: No difficulties  Cognition Arousal/Alertness: Awake/alert Behavior During Therapy: WFL for tasks assessed/performed Overall Cognitive Status: Within Functional Limits for tasks assessed                                        General Comments General comments (skin integrity, edema, etc.): EHR in the 150's felt by pt.    Exercises     Assessment/Plan  PT Assessment Patient needs continued PT services  PT Problem List Decreased strength;Decreased activity tolerance;Decreased coordination       PT Treatment Interventions Gait training;Functional mobility training;Therapeutic activities;Stair training;Patient/family education;Balance training    PT Goals (Current goals can be found in the Care Plan section)  Acute Rehab PT Goals Patient Stated Goal: be able to get back to school safely and be confident getting upstairs to my dorm room PT Goal Formulation:  With patient Time For Goal Achievement: 02/27/18 Potential to Achieve Goals: Good    Frequency Min 3X/week   Barriers to discharge        Co-evaluation               AM-PAC PT "6 Clicks" Daily Activity  Outcome Measure Difficulty turning over in bed (including adjusting bedclothes, sheets and blankets)?: None Difficulty moving from lying on back to sitting on the side of the bed? : None Difficulty sitting down on and standing up from a chair with arms (e.g., wheelchair, bedside commode, etc,.)?: None Help needed moving to and from a bed to chair (including a wheelchair)?: None Help needed walking in hospital room?: A Little Help needed climbing 3-5 steps with a railing? : A Little 6 Click Score: 22    End of Session   Activity Tolerance: Patient tolerated treatment well Patient left: in chair;with call bell/phone within reach;with family/visitor present Nurse Communication: Mobility status PT Visit Diagnosis: Unsteadiness on feet (R26.81);Other symptoms and signs involving the nervous system (R29.898)    Time: 7737-3668 PT Time Calculation (min) (ACUTE ONLY): 18 min   Charges:   PT Evaluation $PT Eval Low Complexity: 1 Low          02/20/2018  Donnella Sham, PT Acute Rehabilitation Services [phone]  (pager) [phone]  (office)  Tessie Fass Aneri Slagel 02/20/2018, 6:18 PM

## 2018-02-20 NOTE — Progress Notes (Signed)
RT NOTE: Patient achieved NIF of -40 and VC of 1.3L with good effort. RT will continue to monitor.

## 2018-02-21 MED ORDER — ACD FORMULA A 0.73-2.45-2.2 GM/100ML VI SOLN
500.0000 mL | Status: DC
  Filled 2018-02-21 (×2): qty 500

## 2018-02-21 MED ORDER — ACD FORMULA A 0.73-2.45-2.2 GM/100ML VI SOLN
Status: AC
  Filled 2018-02-21: qty 500

## 2018-02-21 MED ORDER — CALCIUM CARBONATE ANTACID 500 MG PO CHEW: 2.0000 | CHEWABLE_TABLET | ORAL | Status: DC

## 2018-02-21 MED ORDER — DIPHENHYDRAMINE HCL 25 MG PO CAPS: 25.0000 mg | ORAL_CAPSULE | Freq: Four times a day (QID) | ORAL | Status: DC | PRN

## 2018-02-21 MED ORDER — ACETAMINOPHEN 325 MG PO TABS: 650.0000 mg | ORAL_TABLET | ORAL | Status: DC | PRN

## 2018-02-21 MED ORDER — HEPARIN SODIUM (PORCINE) 1000 UNIT/ML IJ SOLN: 1000.0000 [IU] | Freq: Once | INTRAMUSCULAR | Status: DC

## 2018-02-21 MED ORDER — SODIUM CHLORIDE 0.9 % IV SOLN
2.0000 g | Freq: Once | INTRAVENOUS | Status: DC
  Filled 2018-02-21: qty 20

## 2018-02-21 MED ORDER — CALCIUM CARBONATE ANTACID 500 MG PO CHEW
CHEWABLE_TABLET | ORAL | Status: AC
  Filled 2018-02-21: qty 4

## 2018-02-21 MED ORDER — SODIUM CHLORIDE 0.9 % IV SOLN
INTRAVENOUS | Status: DC
  Filled 2018-02-21 (×3): qty 200

## 2018-02-21 MED ORDER — METOPROLOL TARTRATE 12.5 MG HALF TABLET: 12.5000 mg | ORAL_TABLET | Freq: Two times a day (BID) | ORAL | Status: DC

## 2018-02-21 NOTE — Progress Notes (Signed)
PROGRESS NOTE    Patient: Suzanne Nguyen                            PCP: Patient, No Pcp Per                    DOB: 1995/09/11            DOA: 02/15/2018 ASN:[id number]             DOS: 02/21/2018, 11:01 AM   LOS: 6 days   Date of Service: The patient was seen and examined on 02/21/2018  Subjective:   Patient was seen and examined this morning, stable no acute distress.   Apparently refused to take her metoprolol 25 mg, but she agreed to take to 12.5 mg p.o. twice daily Splinted that will help with her heart rate and her blood pressure.  She expressed understanding and agreement.  -Concern for episode post treatment with hypotension, she was reassured that we will monitor and if need be we will give her a bolus of fluid post treatment.   Brief Narrative:  Patient is a 22 y.o. female with history of seropositive myasthenia gravis-status post thymectomy July 2019 presenting with generalized weakness and diplopia, thought to have myasthenia gravis crisis.  Slowly improving with plasmapheresis.  See below for further details   Principal Problem:   Myasthenia gravis in crisis [facility]) Active Problems:   Polycystic ovarian syndrome   S/P thymectomy   Acute respiratory failure (HCC)   Malnutrition of moderate degree    Assessment & Plan:   Myasthenia gravis in crisis:  -Much improved symptoms as far as weakness, and double vision. -Neurology following very closely to complete 5 rounds of plasma exchange every other day Second dose was on 9/17 (treatment around 2/5), due for her third treatment today 9/19 -Continue to be remain on Continue Mestinon, CellCept and prednisone.  Neurology following.  Orthostatic hypotension -Blood pressure improved -Responded well to IV fluid hydration -Encourage p.o. hydration  Sinus tachycardia: T -Stable heart rate 85-120 currently 115 -Dopplers negative-therefore doubt VTE at this point.   -Patient was started on metoprolol low-dose 12.5  mg, still tachycardic this morning, was increased to 25 mg but  Refused to take 25 mg, was cut back down to 12.5 grams p.o.  Could be secondary to deconditioning and acute illness-it is mostly with movement/ambulation.  Malnutrition of moderate degree: Continue supplements.  Brief episode of orthostatic hypotension -Resolved  DVT Prophylaxis: Begin prophylactic Lovenox   Code Status: Full code   Family Communication: None at bedside  Disposition Plan: Remain inpatient -home when patient completes a course of plasmapheresis.   Procedures: 9/15>> tunneled HD catheter  CONSULTS:  Neurology and IR   Procedures:   No admission procedures for hospital encounter.   Antimicrobials:  Anti-infectives (From admission, onward)   Start     Dose/Rate Route Frequency Ordered Stop   02/17/18 0905  ceFAZolin (ANCEF) IVPB 1 g/50 mL premix     over 30 Minutes Intravenous Continuous PRN 02/17/18 0938 02/17/18 0905   02/17/18 0856  ceFAZolin (ANCEF) 2-4 GM/100ML-% IVPB    Note to Pharmacy:  Margaretmary Dys   : cabinet override      02/17/18 0856 02/17/18 2059      Current medications:  . calcium carbonate  500 mg Oral BID  . enoxaparin (LOVENOX) injection  40 mg Subcutaneous Q24H  . feeding supplement  1 Container Oral TID BM  .  heparin  1,000 Units Intracatheter Once  . LORazepam  0.5 mg Intravenous Once  . metoprolol tartrate  12.5 mg Oral BID  . multivitamin with minerals  1 tablet Oral Daily  . mycophenolate  500 mg Oral BID  . predniSONE  60 mg Oral Q breakfast  . pyridostigmine  90 mg Oral 5 X Daily     Objective:   Vitals:   02/21/18 0357 02/21/18 0530 02/21/18 0830 02/21/18 0933  BP: 114/69  112/73   Pulse: 90   (!) 115  Resp: 19  (!) 23   Temp: 98.3 F (36.8 C)  98.7 F (37.1 C)   TempSrc: Oral  Oral   SpO2: 97%     Weight:  94.5 kg    Height:        Intake/Output Summary (Last 24 hours) at 02/21/2018 1101 Last data filed at 02/21/2018 0930 Gross  per 24 hour  Intake 480 ml  Output -  Net 480 ml   Filed Weights   02/16/18 0500 02/20/18 0500 02/21/18 0530  Weight: 91 kg 95.2 kg 94.5 kg     Examination:   BP 112/73   Pulse (!) 115   Temp 98.7 F (37.1 C) (Oral)   Resp (!) 23   Ht 5\' 6"  (1.676 m)   Wt 94.5 kg   SpO2 97%   BMI 33.63 kg/m    Physical Exam  Constitution:  Alert, cooperative, no distress,  Psychiatric: Normal and stable mood and affect, cognition intact,   HEENT: Normocephalic, PERRL, otherwise with in Normal limits  Chest:Chest symmetric Cardio vascular:  S1/S2, RRR, No murmure, No Rubs or Gallops  pulmonary: Clear to auscultation bilaterally, respirations unlabored, negative wheezes / crackles Abdomen: Soft, non-tender, non-distended, bowel sounds,no masses, no organomegaly Muscular skeletal: Limited exam - in bed, able to move all 4 extremities, Normal strength,  Neuro: CNII-XII intact. , normal motor and sensation, reflexes intact  Extremities: No pitting edema lower extremities, +2 pulses  Skin: Dry, warm to touch, negative for any Rashes, No open wounds Wounds: per nursing documentation    LABs:  CBC Latest Ref Rng & Units 02/20/2018 02/19/2018 02/19/2018  WBC 4.0 - 10.5 K/uL 11.1(H) 9.5 -  Hemoglobin 12.0 - 15.0 g/dL 14.5 13.8 13.9  Hematocrit 36.0 - 46.0 % 44.4 42.7 41.0  Platelets 150 - 400 K/uL 201 234 -   CMP Latest Ref Rng & Units 02/20/2018 02/19/2018 02/19/2018  Glucose 70 - 99 mg/dL 128(H) 85 134(H)  BUN 6 - 20 mg/dL 5(L) 6 5(L)  Creatinine 0.44 - 1.00 mg/dL 0.59 0.80 0.78  Sodium 135 - 145 mmol/L 141 142 140  Potassium 3.5 - 5.1 mmol/L 4.6 3.3(L) 3.5  Chloride 98 - 111 mmol/L 107 105 104  CO2 22 - 32 mmol/L 24 - 27  Calcium 8.9 - 10.3 mg/dL 9.6 - 9.6  Total Protein 6.5 - 8.1 g/dL - - -  Total Bilirubin 0.3 - 1.2 mg/dL - - -  Alkaline Phos 38 - 126 U/L - - -  AST 15 - 41 U/L - - -  ALT 0 - 44 U/L - - -

## 2018-02-21 NOTE — Progress Notes (Signed)
Hemodialysis called, machine is broken and patient cannot have a plasma exchange on today. They will try again tomorrow.

## 2018-02-21 NOTE — Progress Notes (Signed)
Physical Therapy Treatment Patient Details Name: Suzanne Nguyen MRN: [id number] DOB: June 22, 1995 Today's Date: 02/21/2018    History of Present Illness Patient is a 22 y.o. female with history of seropositive myasthenia gravis-status post thymectomy July 2019 presenting with generalized weakness and diplopia, thought to have myasthenia gravis crisis.  Slowly improving with plasmapheresis    PT Comments    Pt showed small improvements over eval yesterday.  DGI suggestive of a lower risk of falling.   Follow Up Recommendations  No PT follow up     Equipment Recommendations  None recommended by PT    Recommendations for Other Services       Precautions / Restrictions Precautions Precautions: Fall(minimal risk)    Mobility  Bed Mobility               General bed mobility comments: OOB in the chair on arrival  Transfers     Transfers: Sit to/from Stand Sit to Stand: Independent            Ambulation/Gait Ambulation/Gait assistance: Supervision Gait Distance (Feet): 700 Feet Assistive device: None Gait Pattern/deviations: Step-through pattern Gait velocity: close to age appropriate Gait velocity interpretation: >2.62 ft/sec, indicative of community ambulatory General Gait Details: generally steady, minor episodes of unsteadiness that pt can easily recover from    Stairs Stairs: Yes Stairs assistance: Supervision Stair Management: One rail Left;Alternating pattern;Forwards Number of Stairs: 5 General stair comments: safe with the rail   Wheelchair Mobility    Modified Rankin (Stroke Patients Only)       Balance                                 Standardized Balance Assessment Standardized Balance Assessment : Dynamic Gait Index   Dynamic Gait Index Level Surface: Normal Change in Gait Speed: Normal Gait with Horizontal Head Turns: Normal Gait with Vertical Head Turns: Mild Impairment Gait and Pivot Turn: Mild  Impairment Step Over Obstacle: Normal Step Around Obstacles: Normal Steps: Mild Impairment Total Score: 21      Cognition Arousal/Alertness: Awake/alert Behavior During Therapy: WFL for tasks assessed/performed Overall Cognitive Status: Within Functional Limits for tasks assessed                                        Exercises      General Comments        Pertinent Vitals/Pain Pain Assessment: Faces Faces Pain Scale: No hurt Pain Intervention(s): Monitored during session    Home Living                      Prior Function            PT Goals (current goals can now be found in the care plan section) Acute Rehab PT Goals Patient Stated Goal: be able to get back to school safely and be confident getting upstairs to my dorm room PT Goal Formulation: With patient Time For Goal Achievement: 02/27/18 Potential to Achieve Goals: Good Progress towards PT goals: Progressing toward goals    Frequency    Min 3X/week      PT Plan Current plan remains appropriate    Co-evaluation              AM-PAC PT "6 Clicks" Daily Activity  Outcome Measure  Difficulty turning over in bed (including  adjusting bedclothes, sheets and blankets)?: None Difficulty moving from lying on back to sitting on the side of the bed? : None Difficulty sitting down on and standing up from a chair with arms (e.g., wheelchair, bedside commode, etc,.)?: None Help needed moving to and from a bed to chair (including a wheelchair)?: None Help needed walking in hospital room?: A Little Help needed climbing 3-5 steps with a railing? : A Little 6 Click Score: 22    End of Session   Activity Tolerance: Patient tolerated treatment well Patient left: in chair;with call bell/phone within reach;with family/visitor present Nurse Communication: Mobility status PT Visit Diagnosis: Other symptoms and signs involving the nervous system (R29.898)     Time: 4656-8127 PT Time  Calculation (min) (ACUTE ONLY): 18 min  Charges:  $Gait Training: 8-22 mins                     02/21/2018  Suzanne Nguyen, [place] [phone]  (pager) [phone]  (office)   Suzanne Nguyen Suzanne Nguyen 02/21/2018, 5:09 PM

## 2018-02-21 NOTE — Progress Notes (Signed)
Patient is refusing her Metoprolol. She took Metoprolol in the morning and is afraid to take when she is getting ready to sleep. She said she will take it again in the morning. Her HR is 80-90. Sent Dr Hilbert Bible a text page with this update.

## 2018-02-21 NOTE — Plan of Care (Signed)

## 2018-02-21 NOTE — Progress Notes (Signed)
Patient perform pulmonary mechanics with good effort, no distress or complications noted   NIF:  -34 FVC: 1.1L

## 2018-02-21 NOTE — Care Management Note (Signed)
Case Management Note  Patient Details  Name: Suzanne Nguyen MRN: [id number] Date of Birth: Jul 22, 1995  Subjective/Objective: Pt admitted on 02/15/18 with myasthenia gravis crisis.  PTA, pt independent, and a Ship broker at [employer].                    Action/Plan: Pt receiving plasma exchanges every other day (total of 5).  Will continue to follow for discharge needs as pt progresses.  Expected Discharge Date:                  Expected Discharge Plan:     In-House Referral:     Discharge planning Services  CM Consult  Post Acute Care Choice:    Choice offered to:     DME Arranged:    DME Agency:     HH Arranged:    HH Agency:     Status of Service:  In process, will continue to follow  If discussed at Long Length of Stay Meetings, dates discussed:    Additional Comments:  Reinaldo Raddle, RN, BSN  Trauma/Neuro ICU Case Manager [phone]

## 2018-02-21 NOTE — Progress Notes (Signed)
Patient achieved NIF of greater than -40 and VC of 1.35L with good effort. Vitals are stable. RT will will continue to monitor.

## 2018-02-21 NOTE — Progress Notes (Signed)
Spoke to Aviva Signs, RN on 4NP and informed her the pharesis tx for the patient has been moved to 02/22/18 d/t machine issues; also informed her that Dr. Weldon Picking Aroor was called and  Made aware.

## 2018-02-22 LAB — CBC
HCT: 39.6 % (ref 36.0–46.0)
HEMOGLOBIN: 12.7 g/dL (ref 12.0–15.0)
MCH: 29.4 pg (ref 26.0–34.0)
MCHC: 32.1 g/dL (ref 30.0–36.0)
MCV: 91.7 fL (ref 78.0–100.0)
Platelets: 195 10*3/uL (ref 150–400)
RBC: 4.32 MIL/uL (ref 3.87–5.11)
RDW: 14.3 % (ref 11.5–15.5)
WBC: 11.2 10*3/uL — AB (ref 4.0–10.5)

## 2018-02-22 LAB — COMPREHENSIVE METABOLIC PANEL
ALBUMIN: 4.2 g/dL (ref 3.5–5.0)
ALK PHOS: 29 U/L — AB (ref 38–126)
ALT: 16 U/L (ref 0–44)
ANION GAP: 10 (ref 5–15)
AST: 24 U/L (ref 15–41)
BUN: 8 mg/dL (ref 6–20)
CALCIUM: 9.6 mg/dL (ref 8.9–10.3)
CO2: 27 mmol/L (ref 22–32)
Chloride: 104 mmol/L (ref 98–111)
Creatinine, Ser: 0.57 mg/dL (ref 0.44–1.00)
GFR calc Af Amer: >60 mL/min (ref >60)
GFR calc non Af Amer: >60 mL/min (ref >60)
GLUCOSE: 189 mg/dL — AB (ref 70–99)
Potassium: 3.9 mmol/L (ref 3.5–5.1)
Sodium: 141 mmol/L (ref 135–145)
TOTAL PROTEIN: 6.3 g/dL — AB (ref 6.5–8.1)
Total Bilirubin: 0.4 mg/dL (ref 0.3–1.2)

## 2018-02-22 MED ORDER — DIPHENHYDRAMINE HCL 25 MG PO CAPS: 25.0000 mg | ORAL_CAPSULE | Freq: Four times a day (QID) | ORAL | Status: DC | PRN

## 2018-02-22 MED ORDER — SODIUM CHLORIDE 0.9 % IV SOLN
INTRAVENOUS | Status: AC
  Administered 2018-02-22 (×3): via INTRAVENOUS_CENTRAL
  Filled 2018-02-22 (×3): qty 200

## 2018-02-22 MED ORDER — ACD FORMULA A 0.73-2.45-2.2 GM/100ML VI SOLN
500.0000 mL | Status: DC
  Filled 2018-02-22 (×2): qty 500

## 2018-02-22 MED ORDER — CALCIUM CARBONATE ANTACID 500 MG PO CHEW
CHEWABLE_TABLET | ORAL | Status: AC
  Administered 2018-02-22: 400 mg
  Filled 2018-02-22: qty 4

## 2018-02-22 MED ORDER — HEPARIN SODIUM (PORCINE) 1000 UNIT/ML IJ SOLN: 4000.0000 [IU] | Freq: Once | INTRAMUSCULAR | Status: DC

## 2018-02-22 MED ORDER — ACD FORMULA A 0.73-2.45-2.2 GM/100ML VI SOLN
Status: AC
  Administered 2018-02-22: 500 mL
  Filled 2018-02-22: qty 500

## 2018-02-22 MED ORDER — CALCIUM CARBONATE ANTACID 500 MG PO CHEW
2.0000 | CHEWABLE_TABLET | ORAL | Status: AC
  Administered 2018-02-22 (×2): 400 mg via ORAL

## 2018-02-22 MED ORDER — SODIUM CHLORIDE 0.9 % IV SOLN
2.0000 g | Freq: Once | INTRAVENOUS | Status: AC
  Administered 2018-02-22: 2 g via INTRAVENOUS
  Filled 2018-02-22: qty 20

## 2018-02-22 MED ORDER — HEPARIN SODIUM (PORCINE) 1000 UNIT/ML IJ SOLN
INTRAMUSCULAR | Status: AC
  Filled 2018-02-22: qty 4

## 2018-02-22 MED ORDER — METOPROLOL SUCCINATE ER 25 MG PO TB24
12.5000 mg | ORAL_TABLET | Freq: Every day | ORAL | Status: DC
  Administered 2018-02-22 – 2018-02-25 (×4): 12.5 mg via ORAL
  Filled 2018-02-22 (×4): qty 1

## 2018-02-22 MED ORDER — HEPARIN SODIUM (PORCINE) 1000 UNIT/ML IJ SOLN
1000.0000 [IU] | Freq: Once | INTRAMUSCULAR | Status: AC
  Administered 2018-02-22: 1000 [IU]

## 2018-02-22 MED ORDER — ACETAMINOPHEN 325 MG PO TABS: 650.0000 mg | ORAL_TABLET | ORAL | Status: DC | PRN

## 2018-02-22 NOTE — Progress Notes (Signed)
NIF -30, VC 1.2 with good effort

## 2018-02-22 NOTE — Progress Notes (Signed)
Physical Therapy Treatment Patient Details Name: Suzanne Nguyen MRN: [id number] DOB: 1995-08-06 Today's Date: 02/22/2018    History of Present Illness Patient is a 22 y.o. female with history of seropositive myasthenia gravis-status post thymectomy July 2019 presenting with generalized weakness and diplopia, thought to have myasthenia gravis crisis.  Slowly improving with plasmapheresis    PT Comments    Pt continues to demonstrate minor higher level balance deficits. She is overall supervision to independent level with all mobility. Of note, pt's HR elevated to as high as 150 bpm during activity which quickly returned to low 110's with sitting. PT will continue to follow acutely.    Follow Up Recommendations  No PT follow up     Equipment Recommendations  None recommended by PT    Recommendations for Other Services       Precautions / Restrictions Precautions Precautions: None Restrictions Weight Bearing Restrictions: No    Mobility  Bed Mobility               General bed mobility comments: OOB in the chair on arrival  Transfers Overall transfer level: Independent Equipment used: None                Ambulation/Gait Ambulation/Gait assistance: Supervision Gait Distance (Feet): 500 Feet Assistive device: None Gait Pattern/deviations: Step-through pattern Gait velocity: WFL Gait velocity interpretation: >2.62 ft/sec, indicative of community ambulatory General Gait Details: pt steady without use of an AD, but decreased pace and took several standing rest breaks. Of note, pt's HR elevated to as high as 150 bpm with ambulation   Stairs             Wheelchair Mobility    Modified Rankin (Stroke Patients Only)       Balance Overall balance assessment: Needs assistance                           High level balance activites: Direction changes;Turns;Head turns High Level Balance Comments: supervision throughout; pt with minor LOB  with L sided head turns secondary to visual deficits in R eye Standardized Balance Assessment Standardized Balance Assessment : Dynamic Gait Index   Dynamic Gait Index Level Surface: Normal Change in Gait Speed: Normal Gait with Horizontal Head Turns: Moderate Impairment Gait with Vertical Head Turns: Mild Impairment Gait and Pivot Turn: Normal Step Over Obstacle: Normal Step Around Obstacles: Normal Steps: Mild Impairment Total Score: 20      Cognition Arousal/Alertness: Awake/alert Behavior During Therapy: WFL for tasks assessed/performed Overall Cognitive Status: Within Functional Limits for tasks assessed                                        Exercises      General Comments        Pertinent Vitals/Pain Pain Assessment: No/denies pain    Home Living                      Prior Function            PT Goals (current goals can now be found in the care plan section) Acute Rehab PT Goals PT Goal Formulation: With patient Time For Goal Achievement: 02/27/18 Potential to Achieve Goals: Good Progress towards PT goals: Progressing toward goals    Frequency    Min 3X/week      PT Plan Current plan remains  appropriate    Co-evaluation              AM-PAC PT "6 Clicks" Daily Activity  Outcome Measure  Difficulty turning over in bed (including adjusting bedclothes, sheets and blankets)?: None Difficulty moving from lying on back to sitting on the side of the bed? : None Difficulty sitting down on and standing up from a chair with arms (e.g., wheelchair, bedside commode, etc,.)?: None Help needed moving to and from a bed to chair (including a wheelchair)?: None Help needed walking in hospital room?: None Help needed climbing 3-5 steps with a railing? : None 6 Click Score: 24    End of Session Equipment Utilized During Treatment: Gait belt Activity Tolerance: Patient tolerated treatment well Patient left: in chair;with call  bell/phone within reach Nurse Communication: Mobility status PT Visit Diagnosis: Other symptoms and signs involving the nervous system (R29.898)     Time: 0511-0211 PT Time Calculation (min) (ACUTE ONLY): 19 min  Charges:  $Gait Training: 8-22 mins                     Sherie Don, [place], DPT  Acute Rehabilitation Services Pager [phone] Office [place] 02/22/2018, 4:05 PM

## 2018-02-22 NOTE — Progress Notes (Signed)
NIF -40, VC 1.5L

## 2018-02-22 NOTE — Progress Notes (Signed)
Nutrition Follow-up  DOCUMENTATION CODES:   Obesity unspecified, Non-severe (moderate) malnutrition in context of chronic illness  INTERVENTION:  Continue Boost Breeze po TID, each supplement provides 250 kcal and 9 grams of protein  Encourage adequate PO intake.   NUTRITION DIAGNOSIS:   Moderate Malnutrition related to chronic illness(myasthenia gravis) as evidenced by mild muscle depletion, percent weight loss(9% weight loss in 4 months); ongoing  GOAL:   Patient will meet greater than or equal to 90% of their needs; met  MONITOR:   PO intake, Supplement acceptance, Weight trends, Labs  REASON FOR ASSESSMENT:   Malnutrition Screening Tool    ASSESSMENT:   22 year old female who presented to the ED on 9/13 with weakness by recommendation of outpatient neurologist. [place] significant for myasthenia gravis s/p thymectomy in July 2019.  Meal completion has been 75%. Pt currently has Boost Breeze ordered and has been consuming them. Pt continues to undergo plasmapheresis treatment. Plans to undergo the 3rd (out of 5 treatment sessions) today. RD to continue with current nutrition supplement ordered to aid in caloric and protein needs. Labs and medications reviewed.   Diet Order:   Diet Order            Diet regular Room service appropriate? Yes; Fluid consistency: Thin  Diet effective now              EDUCATION NEEDS:   No education needs have been identified at this time  Skin:  Skin Assessment: Reviewed RN Assessment  Last BM:  9/19  Height:   Ht Readings from Last 1 Encounters:  02/15/18 _0  (1.676 m)    Weight:   Wt Readings from Last 1 Encounters:  02/21/18 94.5 kg    Ideal Body Weight:  59.09 kg  BMI:  Body mass index is 33.63 kg/m.  Estimated Nutritional Needs:   Kcal:  2000-2200  Protein:  100-115 grams  Fluid:  2.0-2.2 L    Corrin Parker, MS, RD, LDN Pager # [phone] After hours/ weekend pager # [phone]

## 2018-02-22 NOTE — Progress Notes (Signed)
Reason for consult: Myasthenia gravis exacerbation  Subjective: Walking the hallway with PT. appears to have no trouble with gait.  Still has some bulbar weakness and bilateral mild ptosis.   ROS: negative except above  Examination  Vital signs in last 24 hours: Temp:  [97.6 F (36.4 C)-98.5 F (36.9 C)] 98 F (36.7 C) (09/20 2009) Pulse Rate:  [77-104] 104 (09/20 2009) Resp:  [12-29] 24 (09/20 2009) BP: (105-161)/(66-90) 117/82 (09/20 2009) SpO2:  [94 %-99 %] 97 % (09/20 2009)  General: Appears well nourished CVS: pulse-normal rate and rhythm RS: breathing comfortably Extremities: normal   Neuro: MS: Alert, oriented, follows commands CN: pupils equal and reactive,  EOMI, mild ptosis bilaterally. Motor: Good strength in all 4 extremities Coordination: normal Gait: not tested  Basic Metabolic Panel: Recent Labs  Lab 02/16/18 0304  02/18/18 0256 02/19/18 0953 02/19/18 1551 02/20/18 0328 02/22/18 1729  NA 138   < > 142 140 142 141 141  K 3.5   < > 4.1 3.5 3.3* 4.6 3.9  CL 100   < > 111 104 105 107 104  CO2 28  --  24 27  --  24 27  GLUCOSE 81   < > 86 134* 85 128* 189*  BUN 8   < > 6 5* 6 5* 8  CREATININE 0.67   < > 0.66 0.78 0.80 0.59 0.57  CALCIUM 9.5  --  9.5 9.6  --  9.6 9.6   < > = values in this interval not displayed.    CBC: Recent Labs  Lab 02/16/18 0304  02/18/18 0256 02/19/18 0953 02/19/18 1551 02/19/18 1615 02/20/18 0328 02/22/18 1729  WBC 5.8  --  10.8* 9.7  --  9.5 11.1* 11.2*  NEUTROABS 3.2  --   --  4.9  --   --   --   --   HGB 11.9*   < > 14.1 13.8 13.9 13.8 14.5 12.7  HCT 37.2   < > 44.8 43.4 41.0 42.7 44.4 39.6  MCV 91.4  --  91.6 91.2  --  91.0 90.1 91.7  PLT 276  --  266 243  --  234 201 195   < > = values in this interval not displayed.     Coagulation Studies: No results for input(s): LABPROT, INR in the last 72 hours.  Imaging Reviewed:     ASSESSMENT AND PLAN  22 year old with seropositive myasthenia gravis status  post thymectomy presenting with possible myasthenia crisis.  At this point she has received 2 plasma exchanges and will receive third dose today.  She was supposed to receive her third dose yesterday however unable to do to issue with dialysis equipment.    Recommendations: -Complete 5 doses of PLEX. Dose 3/5 today.  If patient tolerates and has no syncopal events, will from plasma exchange again tomorrow.  -Medication will complete her last dose on Monday.    -NIF/FVC's every 8 hours -Mestinon90mg 5times daily -CellCept 500 mg daily -Prednisone 60 mg daily -We will continue to follow patient    Karena Addison Kylar Speelman [facility] Pager Number [phone] For questions after 7pm please refer to AMION to reach the Neurologist on call

## 2018-02-22 NOTE — Progress Notes (Signed)
PROGRESS NOTE    Patient: Suzanne Nguyen                            PCP: Patient, No Pcp Per                    DOB: Feb 08, 1996            DOA: 02/15/2018 [id number]             DOS: 02/22/2018, 12:25 PM   LOS: 7 days   Date of Service: The patient was seen and examined on 02/22/2018  Subjective:    Patient was seen and examined this morning, stable, did not go for plasma exchange electrophoresis yesterday as the machine was down in hemodialysis.  No issues overnight stable, still refused with Toprol this morning.  Concerned about post treatment hypertension we will monitor closely  Brief Narrative:  Patient is a 22 y.o. female with history of seropositive myasthenia gravis-status post thymectomy July 2019 presenting with generalized weakness and diplopia, thought to have myasthenia gravis crisis.  Slowly improving with plasmapheresis.  See below for further details   Principal Problem:   Myasthenia gravis in crisis [facility] [place]) Active Problems:   Polycystic ovarian syndrome   S/P thymectomy   Acute respiratory failure (HCC)   Malnutrition of moderate degree    Assessment & Plan:   Myasthenia gravis in crisis:  -Much improved symptoms as far as weakness, and double vision. -Neurology following very closely to complete 5 rounds of plasma exchange every other day Second dose was on 9/17 (treatment around 2/5), to technical difficulty with hemodialysis, she will go for electrophoresis, plasma exchange treatment today--this will be her 3ed session out of 5 -Continue Mestinon, CellCept and prednisone.  Neurology following.  Orthostatic hypotension -Blood pressure improved -Responded well to IV fluid hydration -Encourage p.o. hydration  Sinus tachycardia: T -Stable heart rate 85-120 currently 115 -Dopplers negative-therefore doubt VTE at this point.   -Patient was started on metoprolol low-dose 12.5 mg, still tachycardic this morning, was increased to 25 mg but  Refused  to take 25 mg, was cut back down to 12.5 grams p.o.  Could be secondary to deconditioning and acute illness-it is mostly with movement/ambulation.  Malnutrition of moderate degree: Continue supplements.  Brief episode of orthostatic hypotension -Resolved  DVT Prophylaxis: Begin prophylactic Lovenox   Code Status: Full code   Family Communication: None at bedside  Disposition Plan: Remain inpatient -home when patient completes a course of plasmapheresis.   Procedures: 9/15>> tunneled HD catheter  CONSULTS:  Neurology and IR   Procedures:   No admission procedures for hospital encounter.   Antimicrobials:  Anti-infectives (From admission, onward)   Start     Dose/Rate Route Frequency Ordered Stop   02/17/18 0905  ceFAZolin (ANCEF) IVPB 1 g/50 mL premix     over 30 Minutes Intravenous Continuous PRN 02/17/18 0938 02/17/18 0905   02/17/18 0856  ceFAZolin (ANCEF) 2-4 GM/100ML-% IVPB    Note to Pharmacy:  Margaretmary Dys   : cabinet override      02/17/18 0856 02/17/18 2059      Current medications:  . calcium carbonate  500 mg Oral BID  . enoxaparin (LOVENOX) injection  40 mg Subcutaneous Q24H  . feeding supplement  1 Container Oral TID BM  . LORazepam  0.5 mg Intravenous Once  . metoprolol succinate  12.5 mg Oral Daily  . multivitamin with minerals  1 tablet  Oral Daily  . mycophenolate  500 mg Oral BID  . predniSONE  60 mg Oral Q breakfast  . pyridostigmine  90 mg Oral 5 X Daily     Objective:   Vitals:   02/22/18 0700 02/22/18 0723 02/22/18 0941 02/22/18 1144  BP:  131/73 (!) 161/81   Pulse:  99 94   Resp: (!) 21 18 (!) 27   Temp:  97.8 F (36.6 C) 97.6 F (36.4 C) 97.9 F (36.6 C)  TempSrc:  Oral Oral Oral  SpO2:  94% 98%   Weight:      Height:       No intake or output data in the 24 hours ending 02/22/18 1225 Filed Weights   02/16/18 0500 02/20/18 0500 02/21/18 0530  Weight: 91 kg 95.2 kg 94.5 kg     Examination:  BP (!) 161/81  (BP Location: Left Arm)   Pulse 94   Temp 97.9 F (36.6 C) (Oral)   Resp (!) 27   Ht 5\' 6"  (1.676 m)   Wt 94.5 kg   SpO2 98%   BMI 33.63 kg/m    Physical Exam  Constitution:  Alert, cooperative, no distress,  Psychiatric: Normal and stable mood and affect, cognition intact,   HEENT: Normocephalic, PERRL, otherwise with in Normal limits  Chest:Chest symmetric Cardio vascular:  S1/S2, RRR, No murmure, No Rubs or Gallops  pulmonary: Clear to auscultation bilaterally, respirations unlabored, negative wheezes / crackles Abdomen: Soft, non-tender, non-distended, bowel sounds,no masses, no organomegaly Muscular skeletal: Limited exam - in bed, able to move all 4 extremities, Normal strength,  Neuro: CNII-XII intact. , normal motor and sensation, reflexes intact  Extremities: No pitting edema lower extremities, +2 pulses  Skin: Dry, warm to touch, negative for any Rashes, No open wounds Wounds: per nursing documentation      LABs:  CBC Latest Ref Rng & Units 02/20/2018 02/19/2018 02/19/2018  WBC 4.0 - 10.5 K/uL 11.1(H) 9.5 -  Hemoglobin 12.0 - 15.0 g/dL 14.5 13.8 13.9  Hematocrit 36.0 - 46.0 % 44.4 42.7 41.0  Platelets 150 - 400 K/uL 201 234 -   CMP Latest Ref Rng & Units 02/20/2018 02/19/2018 02/19/2018  Glucose 70 - 99 mg/dL 128(H) 85 134(H)  BUN 6 - 20 mg/dL 5(L) 6 5(L)  Creatinine 0.44 - 1.00 mg/dL 0.59 0.80 0.78  Sodium 135 - 145 mmol/L 141 142 140  Potassium 3.5 - 5.1 mmol/L 4.6 3.3(L) 3.5  Chloride 98 - 111 mmol/L 107 105 104  CO2 22 - 32 mmol/L 24 - 27  Calcium 8.9 - 10.3 mg/dL 9.6 - 9.6  Total Protein 6.5 - 8.1 g/dL - - -  Total Bilirubin 0.3 - 1.2 mg/dL - - -  Alkaline Phos 38 - 126 U/L - - -  AST 15 - 41 U/L - - -  ALT 0 - 44 U/L - - -

## 2018-02-23 MED ORDER — ACETAMINOPHEN 325 MG PO TABS: 650.0000 mg | ORAL_TABLET | ORAL | Status: DC | PRN

## 2018-02-23 MED ORDER — CALCIUM CARBONATE ANTACID 500 MG PO CHEW: 2.0000 | CHEWABLE_TABLET | ORAL | Status: AC

## 2018-02-23 MED ORDER — SODIUM CHLORIDE 0.9 % IV SOLN
INTRAVENOUS | Status: AC
  Administered 2018-02-23 (×3): via INTRAVENOUS_CENTRAL
  Filled 2018-02-23 (×3): qty 200

## 2018-02-23 MED ORDER — SODIUM CHLORIDE 0.9 % IV SOLN
3.0000 g | Freq: Once | INTRAVENOUS | Status: AC
  Administered 2018-02-23: 3 g via INTRAVENOUS
  Filled 2018-02-23 (×2): qty 30

## 2018-02-23 MED ORDER — HEPARIN SODIUM (PORCINE) 1000 UNIT/ML IJ SOLN
1000.0000 [IU] | Freq: Once | INTRAMUSCULAR | Status: AC
  Administered 2018-02-23: 1000 [IU]

## 2018-02-23 MED ORDER — ACD FORMULA A 0.73-2.45-2.2 GM/100ML VI SOLN
500.0000 mL | Status: DC
  Administered 2018-02-23: 500 mL via INTRAVENOUS
  Filled 2018-02-23: qty 500

## 2018-02-23 MED ORDER — ACD FORMULA A 0.73-2.45-2.2 GM/100ML VI SOLN
Status: AC
  Filled 2018-02-23: qty 500

## 2018-02-23 MED ORDER — DIPHENHYDRAMINE HCL 25 MG PO CAPS: 25.0000 mg | ORAL_CAPSULE | Freq: Four times a day (QID) | ORAL | Status: DC | PRN

## 2018-02-23 MED ORDER — CALCIUM CARBONATE ANTACID 500 MG PO CHEW
CHEWABLE_TABLET | ORAL | Status: AC
  Administered 2018-02-23: 500 mg
  Filled 2018-02-23: qty 2

## 2018-02-23 NOTE — Progress Notes (Signed)
Patient returned from HD plasma exchange. Vitals stable, without complaints. Ambulated to bathroom with assist for precautions at this time.  Continue to monitor patient.

## 2018-02-23 NOTE — Progress Notes (Signed)
NIF -40, VC 1.6L with great effort

## 2018-02-23 NOTE — Progress Notes (Addendum)
   Subjective:  Patient awake, alert, up in chair, NAD. States that she feels much better and more confident with walking. She even states " I feel better now than I did at the end of my first plex treatment back in august". Still has some mild ptosis. NIF -40, VC 1.6L with great effort     Examination  Vital signs in last 24 hours: Temp:  [97.7 F (36.5 C)-98.5 F (36.9 C)] 97.8 F (36.6 C) (09/21 1129) Pulse Rate:  [84-104] 97 (09/21 1129) Resp:  [12-29] 21 (09/21 1129) BP: (105-140)/(61-90) 128/79 (09/21 1129) SpO2:  [95 %-100 %] 100 % (09/21 1129) Weight:  [92.9 kg] 92.9 kg (09/21 0300)  General: Appears well nourished CVS: S1, S2 heard, pulse-normal rate and rhythm RS: breathing comfortably, no adventitious breath sounds Extremities: W/D  Neuro: MS: Alert, oriented, follows commands CN: pupils equal and reactive,  EOMI, diplopia noted on upward gaze. mild ptosis bilaterally. Motor: Good strength in all 4 extremities Coordination: no ataxia Gait: not tested  Basic Metabolic Panel: Recent Labs  Lab 02/18/18 0256 02/19/18 0953 02/19/18 1551 02/20/18 0328 02/22/18 1729  NA 142 140 142 141 141  K 4.1 3.5 3.3* 4.6 3.9  CL 111 104 105 107 104  CO2 24 27  --  24 27  GLUCOSE 86 134* 85 128* 189*  BUN 6 5* 6 5* 8  CREATININE 0.66 0.78 0.80 0.59 0.57  CALCIUM 9.5 9.6  --  9.6 9.6    CBC: Recent Labs  Lab 02/18/18 0256 02/19/18 0953 02/19/18 1551 02/19/18 1615 02/20/18 0328 02/22/18 1729  WBC 10.8* 9.7  --  9.5 11.1* 11.2*  NEUTROABS  --  4.9  --   --   --   --   HGB 14.1 13.8 13.9 13.8 14.5 12.7  HCT 44.8 43.4 41.0 42.7 44.4 39.6  MCV 91.6 91.2  --  91.0 90.1 91.7  PLT 266 243  --  234 201 195     Imaging No new images   Laurey Morale, MSN, NP-C [facility] [phone]   Attending Neurologist note to follow:   ASSESSMENT : 22 year old with seropositive myasthenia gravis status post thymectomy presenting with possible  myasthenia crisis.  At this point she has received her third plasma exchange yesterday.  She was supposed to receive her third dose on Thursday however unable to do to issue with dialysis equipment.  She is feeling well, has had no further syncopal events.  We will schedule another dose today.   Recommendations: -Complete 5 doses of PLEX. Dose 4/5 today. -Medication will complete her last dose on Monday.   -NIF/FVC's every 8 hours -Mestinon90mg 5times daily -CellCept 500 mg daily -Prednisone 60 mg daily -We will continue to follow patient  NEUROHOSPITALIST ADDENDUM Performed a face to face diagnostic evaluation.   Patient can do another dose of plasma exchange today.  Her last dose on Monday  I have reviewed the contents of history and physical exam as documented by PA/ARNP/Resident and agree with above documentation.  I have discussed and formulated the above plan as documented. Edits to the note have been made as needed.     Karena Addison Kimmi Acocella MD [facility] [phone]   If 7pm to 7am, please call on call as listed on AMION.

## 2018-02-23 NOTE — Progress Notes (Signed)
NIF -30, VC 1.4L

## 2018-02-23 NOTE — Progress Notes (Signed)
PROGRESS NOTE    Patient: Suzanne Nguyen                            PCP: Patient, No Pcp Per                    DOB: 02-03-1996            DOA: 02/15/2018 [id number]             DOS: 02/23/2018, 1:47 PM   LOS: 8 days   Date of Service: The patient was seen and examined on 02/23/2018  Subjective:    Patient was seen and examined this morning, stable no acute distress, no issues overnight   Tolerated PLEX well yesterday, no episode of hypotension or tachycardia post procedure this time.  Doing well denies any double vision or weaknesses.  Brief Narrative:  Patient is a 22 y.o. female with history of seropositive myasthenia gravis-status post thymectomy July 2019 presenting with generalized weakness and diplopia, thought to have myasthenia gravis crisis.  Slowly improving with plasmapheresis.  See below for further details   Principal Problem:   Myasthenia gravis in crisis [facility]) Active Problems:   Polycystic ovarian syndrome   S/P thymectomy   Acute respiratory failure (HCC)   Malnutrition of moderate degree    Assessment & Plan:   Myasthenia gravis in crisis:  -Much improved symptoms as far as weakness, and double vision. -Neurology following very closely to complete 5 rounds of plasma exchange every other day -Status post PLEX, on 9/20 treatment around 3/5, -Continue Mestinon, CellCept,  prednisone.  Neurology following.  Orthostatic hypotension -Resolved -Status post IV fluid hydration,  Sinus tachycardia: T -Stable, patient is encouraged to continue metoprolol 12.5 mg  p.o. daily -Dopplers negative-therefore doubt VTE at this point.   - Could be secondary to deconditioning and acute illness-it is mostly with movement/ambulation.  Malnutrition of moderate degree: Continue supplements.    DVT Prophylaxis: Begin prophylactic Lovenox   Code Status: Full code   Family Communication: None at bedside  Disposition Plan: Remain inpatient -home  when patient completes a course of plasmapheresis.   Procedures: 9/15>> tunneled HD catheter  CONSULTS:  Neurology and IR   Procedures:   No admission procedures for hospital encounter.   Antimicrobials:  Anti-infectives (From admission, onward)   Start     Dose/Rate Route Frequency Ordered Stop   02/17/18 0905  ceFAZolin (ANCEF) IVPB 1 g/50 mL premix     over 30 Minutes Intravenous Continuous PRN 02/17/18 0938 02/17/18 0905   02/17/18 0856  ceFAZolin (ANCEF) 2-4 GM/100ML-% IVPB    Note to Pharmacy:  Margaretmary Dys   : cabinet override      02/17/18 0856 02/17/18 2059      Current medications:  . calcium carbonate  2 tablet Oral Q3H  . calcium carbonate  500 mg Oral BID  . enoxaparin (LOVENOX) injection  40 mg Subcutaneous Q24H  . feeding supplement  1 Container Oral TID BM  . heparin  1,000 Units Intracatheter Once  . heparin  4,000 Units Intravenous Once  . LORazepam  0.5 mg Intravenous Once  . metoprolol succinate  12.5 mg Oral Daily  . multivitamin with minerals  1 tablet Oral Daily  . mycophenolate  500 mg Oral BID  . predniSONE  60 mg Oral Q breakfast  . pyridostigmine  90 mg Oral 5 X Daily     Objective:   Vitals:  02/22/18 2300 02/23/18 0300 02/23/18 0829 02/23/18 1129  BP: 121/81 120/61 138/78 128/79  Pulse: 91 89 84 97  Resp:   18 (!) 21  Temp: 97.7 F (36.5 C) 98.2 F (36.8 C) 98.5 F (36.9 C) 97.8 F (36.6 C)  TempSrc: Oral Oral Oral Oral  SpO2: 99% 97% 97% 100%  Weight:  92.9 kg    Height:        Intake/Output Summary (Last 24 hours) at 02/23/2018 1347 Last data filed at 02/23/2018 0900 Gross per 24 hour  Intake 720 ml  Output -  Net 720 ml   Filed Weights   02/20/18 0500 02/21/18 0530 02/23/18 0300  Weight: 95.2 kg 94.5 kg 92.9 kg     Examination:    BP 128/79 (BP Location: Right Arm)   Pulse 97   Temp 97.8 F (36.6 C) (Oral)   Resp (!) 21   Ht 5\' 6"  (1.676 m)   Wt 92.9 kg   SpO2 100%   BMI 33.06 kg/m    Physical  Exam  Constitution:  Alert, cooperative, no distress,  Psychiatric: Normal and stable mood and affect, cognition intact,   HEENT: Normocephalic, PERRL, otherwise with in Normal limits  Chest:Chest symmetric Cardio vascular:  S1/S2, RRR, No murmure, No Rubs or Gallops  pulmonary: Clear to auscultation bilaterally, respirations unlabored, negative wheezes / crackles Abdomen: Soft, non-tender, non-distended, bowel sounds,no masses, no organomegaly Muscular skeletal: Limited exam - in bed, able to move all 4 extremities, Normal strength,  Neuro: CNII-XII intact. , normal motor and sensation, reflexes intact  Extremities: No pitting edema lower extremities, +2 pulses  Skin: Dry, warm to touch, negative for any Rashes, No open wounds Wounds: per nursing documentation    LABs:  CBC Latest Ref Rng & Units 02/22/2018 02/20/2018 02/19/2018  WBC 4.0 - 10.5 K/uL 11.2(H) 11.1(H) 9.5  Hemoglobin 12.0 - 15.0 g/dL 12.7 14.5 13.8  Hematocrit 36.0 - 46.0 % 39.6 44.4 42.7  Platelets 150 - 400 K/uL 195 201 234   CMP Latest Ref Rng & Units 02/22/2018 02/20/2018 02/19/2018  Glucose 70 - 99 mg/dL 189(H) 128(H) 85  BUN 6 - 20 mg/dL 8 5(L) 6  Creatinine 0.44 - 1.00 mg/dL 0.57 0.59 0.80  Sodium 135 - 145 mmol/L 141 141 142  Potassium 3.5 - 5.1 mmol/L 3.9 4.6 3.3(L)  Chloride 98 - 111 mmol/L 104 107 105  CO2 22 - 32 mmol/L 27 24 -  Calcium 8.9 - 10.3 mg/dL 9.6 9.6 -  Total Protein 6.5 - 8.1 g/dL 6.3(L) - -  Total Bilirubin 0.3 - 1.2 mg/dL 0.4 - -  Alkaline Phos 38 - 126 U/L 29(L) - -  AST 15 - 41 U/L 24 - -  ALT 0 - 44 U/L 16 - -

## 2018-02-24 NOTE — Progress Notes (Signed)
Reason for consult: Myasthenia gravis exacerbation  Subjective: Patient appears better today.  SHe has no ptosis or weakness on vertical gaze.     ROS: negative except above Examination  Vital signs in last 24 hours: Temp:  [97.5 F (36.4 C)-98.4 F (36.9 C)] 98.2 F (36.8 C) (09/22 1514) Pulse Rate:  [80-106] 106 (09/22 1145) Resp:  [13-21] 13 (09/22 1145) BP: (95-143)/(60-78) 143/78 (09/22 1145) SpO2:  [96 %-100 %] 96 % (09/22 1145)  General: sitting up in bed CVS: pulse-normal rate and rhythm RS: breathing comfortably Extremities: normal   Neuro: MS: Alert, oriented, follows commands CN: pupils equal and reactive, No ptosis, EOMI, face symmetric, Motor: 5/5 strength in all 4 extremities Coordination: normal Gait: not tested  Basic Metabolic Panel: Recent Labs  Lab 02/18/18 0256 02/19/18 0953 02/19/18 1551 02/20/18 0328 02/22/18 1729  NA 142 140 142 141 141  K 4.1 3.5 3.3* 4.6 3.9  CL 111 104 105 107 104  CO2 24 27  --  24 27  GLUCOSE 86 134* 85 128* 189*  BUN 6 5* 6 5* 8  CREATININE 0.66 0.78 0.80 0.59 0.57  CALCIUM 9.5 9.6  --  9.6 9.6    CBC: Recent Labs  Lab 02/18/18 0256 02/19/18 0953 02/19/18 1551 02/19/18 1615 02/20/18 0328 02/22/18 1729  WBC 10.8* 9.7  --  9.5 11.1* 11.2*  NEUTROABS  --  4.9  --   --   --   --   HGB 14.1 13.8 13.9 13.8 14.5 12.7  HCT 44.8 43.4 41.0 42.7 44.4 39.6  MCV 91.6 91.2  --  91.0 90.1 91.7  PLT 266 243  --  234 201 195     Coagulation Studies: No results for input(s): LABPROT, INR in the last 72 hours.  Imaging Reviewed:     ASSESSMENT AND PLAN  22 year old with seropositive myasthenia gravis status post thymectomy presenting with possible myasthenia crisis.  Improved remarkably after receiving plasmapheresis  Plan Complete fifth dose of Plavix on Monday Patient can be discharged after that Continue home medications  F/U with outpatient neurologist Dr. Narda Amber  Neurology will sign off.    Karena Addison Aitanna Haubner [facility] Pager Number [phone] For questions after 7pm please refer to AMION to reach the Neurologist on call

## 2018-02-24 NOTE — Progress Notes (Signed)
NIF- -40 VC 1.2 L with good effort .

## 2018-02-24 NOTE — Progress Notes (Signed)
NIF -35, VC 1.5 L with great effort  RT will clarify with MD if ok to D/C NIF/VC

## 2018-02-24 NOTE — Progress Notes (Signed)
PROGRESS NOTE    Patient: Suzanne Nguyen                            PCP: Patient, No Pcp Per                    DOB: September 04, 1995            DOA: 02/15/2018 GQQ:[id number]             DOS: 02/24/2018, 11:17 AM   LOS: 9 days   Date of Service: The patient was seen and examined on 02/24/2018  Subjective:    The patient was seen and examined this morning, stable no acute distress.  Just for her last dose of Plex treatment to be discharged on Monday.  Brief Narrative:  Patient is a 22 y.o. female with history of seropositive myasthenia gravis-status post thymectomy July 2019 presenting with generalized weakness and diplopia, thought to have myasthenia gravis crisis.  Slowly improving with plasmapheresis.  See below for further details   Principal Problem:   Myasthenia gravis in crisis [facility]) Active Problems:   Polycystic ovarian syndrome   S/P thymectomy   Acute respiratory failure (HCC)   Malnutrition of moderate degree    Assessment & Plan:   Myasthenia gravis in crisis:  -Much improved no focal neurological findings at this time -Neurology following very closely to complete 5 rounds of plasma exchange every other day -Status post PLEX, 9/21 treatment around 3/5,(last treatment 02/25/2018) -Continue Mestinon, CellCept,  prednisone.  Neurology following.  Orthostatic hypotension -Resolved -Status post IV fluid hydration,  Sinus tachycardia: T -Stable, patient is encouraged to continue metoprolol 12.5 mg  p.o. daily -Dopplers negative-therefore doubt VTE at this point.   - Could be secondary to deconditioning and acute illness-it is mostly with movement/ambulation.  Malnutrition of moderate degree: Continue supplements.    DVT Prophylaxis: Begin prophylactic Lovenox   Code Status: Full code   Family Communication: None at bedside  Disposition Plan: [place] discharge Monday, 02/25/2018 post PlEX treatment Remain inpatient -home when patient completes  a course of plasmapheresis.  Procedures: 9/15>> tunneled HD catheter  CONSULTS:  Neurology and IR   Procedures:   No admission procedures for hospital encounter.   Antimicrobials:  Anti-infectives (From admission, onward)   Start     Dose/Rate Route Frequency Ordered Stop   02/17/18 0905  ceFAZolin (ANCEF) IVPB 1 g/50 mL premix     over 30 Minutes Intravenous Continuous PRN 02/17/18 0938 02/17/18 0905   02/17/18 0856  ceFAZolin (ANCEF) 2-4 GM/100ML-% IVPB    Note to Pharmacy:  Margaretmary Dys   : cabinet override      02/17/18 0856 02/17/18 2059      Current medications:  . calcium carbonate  500 mg Oral BID  . enoxaparin (LOVENOX) injection  40 mg Subcutaneous Q24H  . feeding supplement  1 Container Oral TID BM  . heparin  4,000 Units Intravenous Once  . LORazepam  0.5 mg Intravenous Once  . metoprolol succinate  12.5 mg Oral Daily  . multivitamin with minerals  1 tablet Oral Daily  . mycophenolate  500 mg Oral BID  . predniSONE  60 mg Oral Q breakfast  . pyridostigmine  90 mg Oral 5 X Daily     Objective:   Vitals:   02/23/18 2100 02/23/18 2300 02/24/18 0300 02/24/18 0740  BP: 95/69 118/68 116/60 113/63  Pulse: 85 80 85 86  Resp: (!) 21  15 16 20   Temp: 97.8 F (36.6 C) 97.6 F (36.4 C) 97.9 F (36.6 C) (!) 97.5 F (36.4 C)  TempSrc: Oral Oral Oral Oral  SpO2: 100% 100% 98% 96%  Weight:      Height:        Intake/Output Summary (Last 24 hours) at 02/24/2018 1117 Last data filed at 02/24/2018 1002 Gross per 24 hour  Intake 1257.67 ml  Output 0 ml  Net 1257.67 ml   Filed Weights   02/21/18 0530 02/23/18 0300 02/23/18 1515  Weight: 94.5 kg 92.9 kg 92.9 kg     Examination:   BP 113/63 (BP Location: Right Arm)   Pulse 86   Temp (!) 97.5 F (36.4 C) (Oral)   Resp 20   Ht 5\' 6"  (1.676 m)   Wt 92.9 kg   SpO2 96%   BMI 33.06 kg/m    Physical Exam  Constitution:  Alert, cooperative, no distress,  Psychiatric: Normal and stable mood and affect,  cognition intact,   HEENT: Normocephalic, PERRL, otherwise with in Normal limits  Chest:Chest symmetric Cardio vascular:  S1/S2, RRR, No murmure, No Rubs or Gallops  pulmonary: Clear to auscultation bilaterally, respirations unlabored, negative wheezes / crackles Abdomen: Soft, non-tender, non-distended, bowel sounds,no masses, no organomegaly Muscular skeletal: Limited exam - in bed, able to move all 4 extremities, Normal strength,  Neuro: CNII-XII intact. , normal motor and sensation, reflexes intact  Extremities: No pitting edema lower extremities, +2 pulses  Skin: Dry, warm to touch, negative for any Rashes, No open wounds Wounds: per nursing documentation      LABs:  CBC Latest Ref Rng & Units 02/22/2018 02/20/2018 02/19/2018  WBC 4.0 - 10.5 K/uL 11.2(H) 11.1(H) 9.5  Hemoglobin 12.0 - 15.0 g/dL 12.7 14.5 13.8  Hematocrit 36.0 - 46.0 % 39.6 44.4 42.7  Platelets 150 - 400 K/uL 195 201 234   CMP Latest Ref Rng & Units 02/22/2018 02/20/2018 02/19/2018  Glucose 70 - 99 mg/dL 189(H) 128(H) 85  BUN 6 - 20 mg/dL 8 5(L) 6  Creatinine 0.44 - 1.00 mg/dL 0.57 0.59 0.80  Sodium 135 - 145 mmol/L 141 141 142  Potassium 3.5 - 5.1 mmol/L 3.9 4.6 3.3(L)  Chloride 98 - 111 mmol/L 104 107 105  CO2 22 - 32 mmol/L 27 24 -  Calcium 8.9 - 10.3 mg/dL 9.6 9.6 -  Total Protein 6.5 - 8.1 g/dL 6.3(L) - -  Total Bilirubin 0.3 - 1.2 mg/dL 0.4 - -  Alkaline Phos 38 - 126 U/L 29(L) - -  AST 15 - 41 U/L 24 - -  ALT 0 - 44 U/L 16 - -

## 2018-02-25 LAB — COMPREHENSIVE METABOLIC PANEL
ALBUMIN: 3.8 g/dL (ref 3.5–5.0)
ALT: 11 U/L (ref 0–44)
AST: 18 U/L (ref 15–41)
Alkaline Phosphatase: 20 U/L — ABNORMAL LOW (ref 38–126)
Anion gap: 8 (ref 5–15)
BUN: 6 mg/dL (ref 6–20)
CHLORIDE: 103 mmol/L (ref 98–111)
CO2: 29 mmol/L (ref 22–32)
CREATININE: 0.57 mg/dL (ref 0.44–1.00)
Calcium: 9.3 mg/dL (ref 8.9–10.3)
GFR calc Af Amer: >60 mL/min (ref >60)
GFR calc non Af Amer: >60 mL/min (ref >60)
GLUCOSE: 85 mg/dL (ref 70–99)
POTASSIUM: 3.5 mmol/L (ref 3.5–5.1)
Sodium: 140 mmol/L (ref 135–145)
Total Bilirubin: 0.4 mg/dL (ref 0.3–1.2)
Total Protein: 5.2 g/dL — ABNORMAL LOW (ref 6.5–8.1)

## 2018-02-25 LAB — POCT I-STAT, CHEM 8
BUN: 7 mg/dL (ref 6–20)
BUN: 4 mg/dL — AB (ref 6–20)
BUN: 6 mg/dL (ref 6–20)
CALCIUM ION: 1.29 mmol/L (ref 1.15–1.40)
CHLORIDE: 102 mmol/L (ref 98–111)
CHLORIDE: 101 mmol/L (ref 98–111)
CREATININE: 0.5 mg/dL (ref 0.44–1.00)
CREATININE: 0.7 mg/dL (ref 0.44–1.00)
Calcium, Ion: 0.87 mmol/L — CL (ref 1.15–1.40)
Calcium, Ion: 1.28 mmol/L (ref 1.15–1.40)
Chloride: 109 mmol/L (ref 98–111)
Creatinine, Ser: 0.7 mg/dL (ref 0.44–1.00)
GLUCOSE: 81 mg/dL (ref 70–99)
Glucose, Bld: 185 mg/dL — ABNORMAL HIGH (ref 70–99)
Glucose, Bld: 169 mg/dL — ABNORMAL HIGH (ref 70–99)
HEMATOCRIT: 38 % (ref 36.0–46.0)
HEMATOCRIT: 30 % — AB (ref 36.0–46.0)
HEMATOCRIT: 33 % — AB (ref 36.0–46.0)
HEMOGLOBIN: 11.2 g/dL — AB (ref 12.0–15.0)
Hemoglobin: 12.9 g/dL (ref 12.0–15.0)
Hemoglobin: 10.2 g/dL — ABNORMAL LOW (ref 12.0–15.0)
POTASSIUM: 3.4 mmol/L — AB (ref 3.5–5.1)
POTASSIUM: 3.6 mmol/L (ref 3.5–5.1)
Potassium: 4 mmol/L (ref 3.5–5.1)
SODIUM: 140 mmol/L (ref 135–145)
SODIUM: 141 mmol/L (ref 135–145)
Sodium: 139 mmol/L (ref 135–145)
TCO2: 28 mmol/L (ref 22–32)
TCO2: 21 mmol/L — AB (ref 22–32)
TCO2: 27 mmol/L (ref 22–32)

## 2018-02-25 LAB — CBC
HCT: 36.5 % (ref 36.0–46.0)
Hemoglobin: 11.6 g/dL — ABNORMAL LOW (ref 12.0–15.0)
MCH: 29.3 pg (ref 26.0–34.0)
MCHC: 31.8 g/dL (ref 30.0–36.0)
MCV: 92.2 fL (ref 78.0–100.0)
PLATELETS: 188 10*3/uL (ref 150–400)
RBC: 3.96 MIL/uL (ref 3.87–5.11)
RDW: 14.5 % (ref 11.5–15.5)
WBC: 15 10*3/uL — AB (ref 4.0–10.5)

## 2018-02-25 MED ORDER — HEPARIN SODIUM (PORCINE) 1000 UNIT/ML IJ SOLN: 1000.0000 [IU] | Freq: Once | INTRAMUSCULAR | Status: DC

## 2018-02-25 MED ORDER — MYCOPHENOLATE MOFETIL 500 MG PO TABS: 500.0000 mg | ORAL_TABLET | Freq: Two times a day (BID) | ORAL | Status: DC

## 2018-02-25 MED ORDER — MYCOPHENOLATE MOFETIL 500 MG PO TABS: 500.0000 mg | ORAL_TABLET | Freq: Two times a day (BID) | ORAL | 0 refills | Status: DC

## 2018-02-25 MED ORDER — PYRIDOSTIGMINE BROMIDE 60 MG PO TABS: 90.0000 mg | ORAL_TABLET | Freq: Every day | ORAL | 0 refills | Status: DC

## 2018-02-25 MED ORDER — PYRIDOSTIGMINE BROMIDE 60 MG PO TABS: 60.0000 mg | ORAL_TABLET | Freq: Every day | ORAL | Status: DC

## 2018-02-25 MED ORDER — MYCOPHENOLATE MOFETIL 250 MG PO CAPS: 500.0000 mg | ORAL_CAPSULE | Freq: Two times a day (BID) | ORAL | Status: DC

## 2018-02-25 MED ORDER — ACETAMINOPHEN 325 MG PO TABS: 650.0000 mg | ORAL_TABLET | ORAL | Status: DC | PRN

## 2018-02-25 MED ORDER — SODIUM CHLORIDE 0.9 % IV SOLN
INTRAVENOUS | Status: AC
  Administered 2018-02-25 (×3): via INTRAVENOUS_CENTRAL
  Filled 2018-02-25 (×3): qty 200

## 2018-02-25 MED ORDER — DIPHENHYDRAMINE HCL 25 MG PO CAPS: 25.0000 mg | ORAL_CAPSULE | Freq: Four times a day (QID) | ORAL | Status: DC | PRN

## 2018-02-25 MED ORDER — PREDNISONE 20 MG PO TABS: 60.0000 mg | ORAL_TABLET | Freq: Every day | ORAL | Status: DC

## 2018-02-25 MED ORDER — PYRIDOSTIGMINE BROMIDE 60 MG PO TABS: 90.0000 mg | ORAL_TABLET | Freq: Every day | ORAL | Status: DC

## 2018-02-25 MED ORDER — ACD FORMULA A 0.73-2.45-2.2 GM/100ML VI SOLN: 500.0000 mL | Status: DC

## 2018-02-25 MED ORDER — PREDNISONE 20 MG PO TABS: 60.0000 mg | ORAL_TABLET | Freq: Every day | ORAL | 0 refills | Status: DC

## 2018-02-25 MED ORDER — CALCIUM CARBONATE ANTACID 500 MG PO CHEW
CHEWABLE_TABLET | ORAL | Status: AC
  Administered 2018-02-25: 500 mg via ORAL
  Filled 2018-02-25: qty 2

## 2018-02-25 MED ORDER — ACD FORMULA A 0.73-2.45-2.2 GM/100ML VI SOLN
Status: AC
  Filled 2018-02-25: qty 500

## 2018-02-25 MED ORDER — CALCIUM CARBONATE ANTACID 500 MG PO CHEW: 2.0000 | CHEWABLE_TABLET | ORAL | Status: DC

## 2018-02-25 MED ORDER — SODIUM CHLORIDE 0.9 % IV SOLN
2.0000 g | Freq: Once | INTRAVENOUS | Status: DC
  Filled 2018-02-25: qty 20

## 2018-02-25 NOTE — Progress Notes (Signed)
Patient returned from HD stating she feels good and ready to go home.  Patient states her ride will not be here until after 1500.

## 2018-02-25 NOTE — Progress Notes (Signed)
Patient discharged home with Prescriptions in discharge envelope with friend.  Patient agreed to follow appointment in the am with Dr Posey Pronto.

## 2018-02-25 NOTE — Discharge Instructions (Signed)
Myasthenia Gravis  Myasthenia gravis (MG) means severe weakness. It is a long-term (chronic) condition that causes weakness in the muscles you can control (voluntary muscles). MG can affect any voluntary muscle. The muscles most often affected are the ones that control:   Eye movement.   Facial movements.   Swallowing.    MG is an autoimmune disease, which means that your body's defense system (immune system) attacks healthy parts of your body instead of germs and other things that make you sick. When you have MG, your immune system makes proteins (antibodies) that block the chemical (acetylcholine) your body needs to send nerve signals to your muscles. This causes muscle weakness.  What are the causes?  The exact cause of MG is unknown. One possible cause is an enlarged thymus gland, which is located under your breastbone.  What are the signs or symptoms?  The earliest symptom of MG is muscle weakness that gets worse with activity and gets better after rest. Other symptoms of MG may include:   Drooping eyelids.   Double vision.   Loss of facial expression.   Trouble chewing and swallowing.   Slurred speech.   A waddling walk.   Weakness of the arms, hands, and legs.    Trouble breathing is the most dangerous symptom of MG. Sudden and severe difficulty breathing (myasthenic crisis) may require emergency breathing support. This symptom sometimes happens after:   Infection.   Fever.   Drug reaction.    How is this diagnosed?  It can be hard to diagnose MG because muscle weakness is a common symptom in many conditions. Your health care provider will do a physical exam. You may also have tests that will help make a diagnosis. These may include:   A blood test.   A test using the medicine edrophonium. This medicine increases muscle strength by slowing the breakdown of acetylcholine.   Tests to measure nerve conduction to muscle (electromyography).   An imaging study of the chest (CT or MRI).    How is  this treated?  Treatment can improve muscle strength. Sometimes symptoms of MG go away for a while (remission) and you can stop treatment. Possible treatments include:   Medicine.   Removal of the thymus gland (thymectomy). This may result in a long remission for some people.    Follow these instructions at home:   Take medicines only as directed by your health care provider.   Get plenty of rest to conserve your energy.   Take frequent breaks to rest your eyes.   Maintain a healthy diet and a healthy weight.   Do not use any tobacco products including cigarettes, chewing tobacco, or electronic cigarettes. If you need help quitting, ask your health care provider.   Keep all follow-up visits as directed by your health care provider. This is important.  Contact a health care provider if:   Your symptoms get worse after a fever or infection.   You have a reaction to a medicine you are taking.   Your symptoms change or get worse.  Get help right away if:  You have trouble breathing.  This information is not intended to replace advice given to you by your health care provider. Make sure you discuss any questions you have with your health care provider.  Document Released: 08/28/2000 Document Revised: 10/28/2015 Document Reviewed: 07/23/2013  Elsevier Interactive Patient Education  2018 [employer]vier Inc[employer]

## 2018-02-25 NOTE — Discharge Summary (Signed)
DISCHARGE SUMMARY  Suzanne Nguyen  MR#: [id number]  DOB:1996-03-17  Date of Admission: 02/15/2018 Date of Discharge: 02/25/2018  Attending Physician:Jeffrey Hennie Duos, MD  Patient's LYY:[id number], No Pcp Per  Consults: Neurology   Disposition: D/C home   Follow-up Appts: Follow-up Information    Alda Berthold, DO. Schedule an appointment as soon as possible for a visit in 1 week(s).   Specialty:  Neurology Contact information: [place] [phone] [phone]          Discharge Diagnoses: Myasthenia gravis in crisis  Polycystic ovarian syndrome S/P thymectomy Malnutrition of moderate degree  Initial presentation: 22 y.o.femalewith a history of seropositive myasthenia gravis s/p thymectomy July 2019 who presented with generalized weakness and diplopia, thought to have myasthenia gravis crisis.   Hospital Course: The pt was admitted to the acute units, and Neruology was consulted. Plasmaphoresis was prescribed. She completed 5 rounds of plasma exchange on an every other day schedule, w/ her last treatment 02/25/2018. Clinically she was significantly improved/back to her baseline at the time of her d/c. She is to continue Mestinon, CellCept, and prednisone as dosed prior to admission. F/U with outpatient neurologist Dr. Narda Amber.  Allergies as of 02/25/2018      Reactions   Magnesium-containing Compounds Anaphylaxis   paralysis   Corticosteroids    Worsen symptoms of MYASTHENIA GRAVES   Other    Mycins   Procainamide    Irregular Heart beat   Soy Allergy Itching      Medication List    TAKE these medications   CALCIUM PO Take 600 mg by mouth 2 (two) times daily. With vitamin D3 800 units   ibuprofen 200 MG tablet Commonly known as:  ADVIL,MOTRIN Take 200 mg by mouth every 6 (six) hours as needed for mild pain.   multivitamin tablet Take 1 tablet by mouth daily.   mycophenolate 500 MG tablet Commonly known as:   CELLCEPT Take 1 tablet (500 mg total) by mouth 2 (two) times daily. What changed:  how much to take   predniSONE 20 MG tablet Commonly known as:  DELTASONE Take 3 tablets (60 mg total) by mouth daily with breakfast.   pyridostigmine 60 MG tablet Commonly known as:  MESTINON Take 1.5 tablets (90 mg total) by mouth 5 (five) times daily. What changed:    how much to take  how to take this  when to take this  additional instructions  Another medication with the same name was removed. Continue taking this medication, and follow the directions you see here.       Day of Discharge BP 121/78 (BP Location: Right Arm)   Pulse 98   Temp 97.7 F (36.5 C) (Oral)   Resp (!) 22   Ht 5\' 6"  (1.676 m)   Wt 92.4 kg Comment: stood to scale   SpO2 98%   BMI 32.88 kg/m   Physical Exam: General: No acute respiratory distress Lungs: Clear to auscultation bilaterally without wheezes or crackles Cardiovascular: Regular rate and rhythm without murmur gallop or rub normal S1 and S2 Abdomen: Nontender, nondistended, soft, bowel sounds positive, no rebound, no ascites, no appreciable mass Extremities: No significant cyanosis, clubbing, or edema bilateral lower extremities  Basic Metabolic Panel: Recent Labs  Lab 02/19/18 0953  02/20/18 0328 02/22/18 1630 02/22/18 1729 02/23/18 1531 02/25/18 0859 02/25/18 1035  NA 140   < > 141 140 141 141 139 140  K 3.5   < > 4.6 4.0 3.9  3.4* 3.6 3.5  CL 104   < > 107 102 104 109 101 103  CO2 27  --  24  --  27  --   --  29  GLUCOSE 134*   < > 128* 185* 189* 169* 81 85  BUN 5*   < > 5* 7 8 4* 6 6  CREATININE 0.78   < > 0.59 0.70 0.57 0.50 0.70 0.57  CALCIUM 9.6  --  9.6  --  9.6  --   --  9.3   < > = values in this interval not displayed.    Liver Function Tests: Recent Labs  Lab 02/22/18 1729 02/25/18 1035  AST 24 18  ALT 16 11  ALKPHOS 29* 20*  BILITOT 0.4 0.4  PROT 6.3* 5.2*  ALBUMIN 4.2 3.8    CBC: Recent Labs  Lab  02/19/18 0953  02/19/18 1615 02/20/18 0328 02/22/18 1630 02/22/18 1729 02/23/18 1531 02/25/18 0859 02/25/18 1035  WBC 9.7  --  9.5 11.1*  --  11.2*  --   --  15.0*  NEUTROABS 4.9  --   --   --   --   --   --   --   --   HGB 13.8   < > 13.8 14.5 12.9 12.7 10.2* 11.2* 11.6*  HCT 43.4   < > 42.7 44.4 38.0 39.6 30.0* 33.0* 36.5  MCV 91.2  --  91.0 90.1  --  91.7  --   --  92.2  PLT 243  --  234 201  --  195  --   --  188   < > = values in this interval not displayed.     CBG: Recent Labs  Lab 02/19/18 1917  GLUCAP 129*    Recent Results (from the past 240 hour(s))  MRSA PCR Screening     Status: None   Collection Time: 02/15/18 11:28 PM  Result Value Ref Range Status   MRSA by PCR NEGATIVE NEGATIVE Final    Comment:        The GeneXpert MRSA Assay (FDA approved for NASAL specimens only), is one component of a comprehensive MRSA colonization surveillance program. It is not intended to diagnose MRSA infection nor to guide or monitor treatment for MRSA infections. Performed at [facility], [place] [address] [place], [place] [address]      Time spent in discharge (includes decision making & examination of pt): 30 minutes  02/25/2018, 12:35 PM   Cherene Altes, MD [facility] Office  [phone] Pager [phone]  On-Call/Text Page:      [email or web address]      password [facility]

## 2018-02-25 NOTE — Progress Notes (Signed)
Patient ID: Suzanne Nguyen, female   DOB: [birth year], 22 y.o.   MRN: [id number]   Tunneled plasma pheresis catheter was placed in IR 02/17/2018  Discharging from [facility] today  Discussed with Dr Narda Amber nurse Caryl Pina Confirmed pt will be using catheter as OP  Please leave cath in for now.  Catheter left in place No sign of infection No bleeding Functioning well per [facility] RN

## 2018-02-25 NOTE — Care Management Note (Signed)
Case Management Note  Patient Details  Name: Celine Dishman MRN: [id number] Date of Birth: [birth year]  Subjective/Objective: Pt admitted on 02/15/18 with myasthenia gravis crisis.  PTA, pt independent, and a Ship broker at [employer].                    Action/Plan: Pt receiving plasma exchanges every other day (total of 5).  Will continue to follow for discharge needs as pt progresses.  Expected Discharge Date:  02/25/18               Expected Discharge Plan:  Home/Self Care  In-House Referral:     Discharge planning Services  CM Consult  Post Acute Care Choice:    Choice offered to:     DME Arranged:    DME Agency:     HH Arranged:    HH Agency:     Status of Service:  Completed, signed off  If discussed at [employer] of Stay Meetings, dates discussed:    Additional Comments:  02/25/18 J. Prabhnoor Ellenberger, RN, BSN Pt medically stable for discharge home today.  Family and friends able to assist intermittently as needed.  No dc needs identified.  Reinaldo Raddle, RN, BSN  Trauma/Neuro ICU Case Manager [phone]

## 2018-02-25 NOTE — Progress Notes (Signed)
Message sent to Dr Thereasa Solo orders for Discharge Prescriptions.

## 2018-02-25 NOTE — Progress Notes (Signed)
VC: 1.3L  NIF: Greater than -40 With excellent patient effort.

## 2018-02-26 ENCOUNTER — Ambulatory Visit: Payer: Managed Care, Other (non HMO) | Admitting: Neurology

## 2018-02-26 ENCOUNTER — Telehealth: Payer: Self-pay | Admitting: Neurology

## 2018-02-26 ENCOUNTER — Encounter: Payer: Self-pay | Admitting: Neurology

## 2018-02-26 VITALS — BP 118/62 | HR 118 | Ht 66.0 in | Wt 205.0 lb

## 2018-02-26 DIAGNOSIS — R000 Tachycardia, unspecified: Secondary | ICD-10-CM

## 2018-02-26 DIAGNOSIS — Z9089 Acquired absence of other organs: Secondary | ICD-10-CM | POA: Diagnosis not present

## 2018-02-26 DIAGNOSIS — G7000 Myasthenia gravis without (acute) exacerbation: Secondary | ICD-10-CM

## 2018-02-26 DIAGNOSIS — Z7952 Long term (current) use of systemic steroids: Secondary | ICD-10-CM

## 2018-02-26 MED ORDER — PREDNISONE 20 MG PO TABS: 50.0000 mg | ORAL_TABLET | Freq: Every day | ORAL | 0 refills | Status: DC

## 2018-02-26 MED ORDER — PYRIDOSTIGMINE BROMIDE 60 MG PO TABS: ORAL_TABLET | ORAL | 0 refills | Status: DC

## 2018-02-26 MED ORDER — SULFAMETHOXAZOLE-TRIMETHOPRIM 800-160 MG PO TABS: 1.0000 | ORAL_TABLET | ORAL | 1 refills | Status: DC

## 2018-02-26 NOTE — Telephone Encounter (Signed)
Spoke with Suzanne Nguyen and scheduled dates for plasmapheresis. Patient instructed to be there at 7 am at short stay. Will draw CBC with diff and CMP on 03/08/18.

## 2018-02-26 NOTE — Telephone Encounter (Signed)
I would like for patient to continue plasmapheresis as follows:  Wednesday 9/25, Friday 9/27, Monday 9/30, Friday 10/4, Tuesday 10/8, and Friday 10/11.    I will see her back in the clinic on 10/10 at 3:30p to determine additional sessions.  Donika K. Posey Pronto, DO

## 2018-02-26 NOTE — Telephone Encounter (Signed)
Transition Care Management Follow-up Telephone Call    Date discharged? 02/25/2018  How have you been since you were released from the hospital? "almost back to normal"   Any patient concerns? "just to go over test results and one of [her] eyes started getting puffy in the hospital and is still swollen"   Items Reviewed:  Medications reviewed: yes  Allergies reviewed: yes  Dietary changes reviewed: none  Referrals reviewed: none   Functional Questionnaire:  Independent - I Dependent - D    Activities of Daily Living (ADLs):    Personal hygiene - i Dressing - i Eating - i Maintaining continence - i Transferring - i  Independent Activities of Daily Living (iADLs): Basic communication skills - i Transportation - i Meal preparation  - i Shopping - i Housework - i Managing medications - i Managing personal finances - i   Confirmed importance and date/time of follow-up visits scheduled yes  Provider Appointment booked with Dr. Posey Pronto on 02/26/2018 at 3pm  Confirmed with patient if condition begins to worsen call PCP or go to the ER.  Patient was given the office number and encouraged to call back with question or concerns: Yes

## 2018-02-26 NOTE — Patient Instructions (Addendum)
Reduce prednisone to 50mg  daily   Stop 1am mestinon.  Continue mestinon 90mg  at 6am, 11am, 3pm, and 8pm.  Start bactrim on Monday, Wednesday, and Friday.  Continue PLEX on Wednesday 9/25, Friday 9/27, Monday 9/30, Friday 10/4, Tuesday 10/8, Friday 10/11. You will need to be at short stay at 7 am on these dates.   Check labs next week when you go on 10/4. They will draw them for you.   Return to clinic on 10/10 at 3:30p

## 2018-02-26 NOTE — Progress Notes (Signed)
Follow-up Visit   Date: 02/26/18    Suzanne Nguyen MRN: [id number] DOB: 1995/12/10   Interim History: Suzanne Nguyen is a 22 y.o. -handed African American female with PCOS with refractory seropositive myasthenia gravis s/p thymectomy returning to the clinic for transitional care management visit. The patient was accompanied to the clinic by self.  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  She was on spring break at home in [place] when she began noticing that her smile in photos appeared flat.  This was followed by difficulty chewing and especially getting tired easily. Over the next week, her right eye started to droop.  When driving, she would always have to close one eye because of double vision.  Symptoms are always worse at the end of the day.  After she returned to [facility], she saw her eye doctor who sent her to [facility] for further evaluation and was admitted from 3/19 - 3/22.  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  There was high clinical suspicion for myasthenia so AChR antibodies were sent and have returned positive.  She was started on mestinon '30mg'$  TID and prednisone '10mg'$ .  She takes mestinon '30mg'$  at 7am, 3pm, and 10pm which has improved her ability to open her eyes.  She initially has some cramping with mestinon.  Prednisone tends to make her anxious and she is inquiring about alterative medications.    She continues to have double vision with objects on top of each other, fatigue with chewing, leg weakness, difficulty swallowing solids, and has slurred speech with nasal voice as the day progresses.  She has mild dyspnea with exertion, especially when walking to her classes.  She stopped driving because vision changes.  She is mostly bothered by her difficulty chewing and wants to get back to eating  regular foods.  She is drinking ensure and shakes to help supplement meals.  She has lost ~8 lb over the past month.    She will be returning home to [place] in the first week of May and would like to pursue thymectomy while she is on summer break closer to home.  She establish care with me in early April, at which time I started prednisone 30 mg which did not have marked benefit.  Due to her progressive weakness, IVIG was started in late April, which improved much of her bulbar weakness.  She was continued on monthly IVIG over the summer when she returned home and while under the care of [facility]anne Nguyen at [facility].  UPDATE 02/08/2018:  She has been home for the summer in [place] and followed by Suzanne Nguyen at [facility] for myasthenia gravis.  She underwent robotic assisted thymectomy on 7/16 which required taking the left phrenic nerve.  Following surgery, she received IVIG, increased prednisone to '40mg'$  daily and then again admitted for plasmapheresis, which was completed in early August.  Her prednisone was increased to '60mg'$  daily and she has noticed improved chewing and double vision has improved since being on this dose, but legs feel weak.  She does not have shortness of breath or difficulty swallowing.  Double vision especially to the left is unchanged.  She is living on campus apartment on the 3rd floor and has to take frequent breaks to get to her apartment as there is no elevator.  I  will write a letter for her apartment complex to make accommodations for first floor apartment.   She endorses having a Nguyen of anxiety and stress related to her health, especially now that she is no longer with her family who live in [place].  She is planning on continued to finish her college here. Current medication regimen includes:  Mestinon '120mg'$  every 4 hours (2am, 6am, 10am, 2pm, 6pm, 10pm) = '720mg'$ /d, prednisone '60mg'$  daily which she has been for the past 10 days, and IVIG (she does not recall  her last dose).   She will be started Cellcept '500mg'$  BID today.   She endorses GI cramping.   UPDATE 02/26/2018:  She is here for transition care management.  Her last visit, we discussed reducing her Mestinon due to concern of cholinergic weakness and she had appreciated less cramping and initially weakness was better.  However, over the next few days, she started having difficulty swallowing and worsening double vision, for which she was urged to go to the ER.  Patient was admitted to [facility] for 5 sessions of plasmapheresis 9/13 - 02/25/2018.  She was discharged yesterday and started noticing marked improvement in overall strength and weakness.  When she returned home from the hospital yesterday, she was able to climb up to her 3rd floor apartment, complete household chores such as laundry, cooking, cleaning the bathroom, and completing her housework assignments.  She continues to have droopiness of the eyes, mild facial weakness, and trace double vision. She does not have problems with swallowing, shortness of breath, or limb weakness.  She can eat normal textured foods, but takes breaks chewing harder foods like salads.  She is taking mestinon '90mg'$  at 1am, 6am, 11am, 3pm, 8pm. She skipped her 1am dose a few times and did not have any breakthrough weakness. She remains on prednsone '60mg'$  and Cellcept '500mg'$  BID.  Medications:  Current Outpatient Medications on File Prior to Visit  Medication Sig Dispense Refill  . CALCIUM PO Take 600 mg by mouth 2 (two) times daily. With vitamin D3 800 units    . cholecalciferol (VITAMIN D) 1000 units tablet Take 1,000 Units by mouth daily.    Marland Kitchen ibuprofen (ADVIL,MOTRIN) 200 MG tablet Take 200 mg by mouth every 6 (six) hours as needed for mild pain.    . Multiple Vitamin (MULTIVITAMIN) tablet Take 1 tablet by mouth daily.    . mycophenolate (CELLCEPT) 500 MG tablet Take 1 tablet (500 mg total) by mouth 2 (two) times daily. 60 tablet 0   No current  facility-administered medications on file prior to visit.     Allergies:  Allergies  Allergen Reactions  . Magnesium-Containing Compounds Anaphylaxis    paralysis  . Corticosteroids     Worsen symptoms of MYASTHENIA GRAVES  . Other     Mycins  . Procainamide     Irregular Heart beat  . Soy Allergy Itching    Review of Systems:  CONSTITUTIONAL: No fevers, chills, night sweats, or weight loss.  EYES: +visual changes or eye pain ENT: No hearing changes.  No history of nose bleeds.   RESPIRATORY: No cough, wheezing and shortness of breath.   CARDIOVASCULAR: Negative for chest pain, and palpitations.   GI: +for abdominal discomfort, blood in stools or black stools.  No recent change in bowel habits.   GU:  No history of incontinence.   MUSCLOSKELETAL: No history of joint pain or swelling.  No myalgias.   SKIN: Negative for lesions, rash, and itching.  ENDOCRINE: Negative for cold or heat intolerance, polydipsia or goiter.   PSYCH:  No depression +anxiety symptoms.   NEURO: As Above.   Vital Signs:  BP 118/62   Pulse (!) 118   Ht '5\' 6"'$  (1.676 m)   Wt 205 lb (93 kg)   SpO2 96%   BMI 33.09 kg/m    General Medical Exam:   General:  Well-appearing, comfortable.   Eyes/ENT: see cranial nerve examination.   Neck: No masses appreciated.  Full range of motion without tenderness.  No carotid bruits. Respiratory:  Clear to auscultation, good air entry bilaterally.   Cardiac:  Regular rate and rhythm, no murmur.  Right tunnel catheter appears clean and dry. Ext:  No edema  Neurological Exam: MENTAL STATUS including orientation to time, place, person, recent and remote memory, attention span and concentration, language, and fund of knowledge is normal.  Speech is not dysarthric.  CRANIAL NERVES:  Pupils equal round and reactive to light.  There is mild left ptosis, worse with sustained up gaze.  There is mild opthalmoplegia at lateral gaze bilaterally, overall, extraocular  movements are mch improved in all directions.  Facial muscles show mild weakness - frontalis, orbicularis oculi, oris, and buccinator is 4+/5.  Face is symmetric - mild transverse smile. Palate elevates symmetrically.  Tongue is midline and strength is 5/5.  Tongue side-to-side movements are intact.Marland Kitchen  MOTOR:  Motor strength is 5/5 in all extremities, including neck flexion. No fatigability. No pronator drift.  Tone is normal.    COORDINATION/GAIT:  She is able to stand up from low chair without pushing off very easily. Gait narrow based and stable.  She is able to perform half-squat and stand without difficulty.    Data: Labs 08/22/2017:  AChR binding 7.86*, blocking 73*, modulating 58*, TSH 0.98, SSA/B neg, ACE 62, ANA neg, HIV neg, CRP 3.3*, ESR 28*  Lab Results  Component Value Date   CREATININE 0.57 02/25/2018   BUN 6 02/25/2018   NA 140 02/25/2018   K 3.5 02/25/2018   CL 103 02/25/2018   CO2 29 02/25/2018   Lab Results  Component Value Date   WBC 15.0 (H) 02/25/2018   HGB 11.6 (L) 02/25/2018   HCT 36.5 02/25/2018   MCV 92.2 02/25/2018   PLT 188 02/25/2018    CT head and neck w contrast 08/22/2017:   1. Anterior mediastinal mass, most consistent with thymoma. This supports the clinical concern for myasthenia gravis. 2. No arterial abnormality of the head or neck. 3. No intracranial abnormality.  MRI brain wwo contrast 08/22/2017:   1. Negative brain MRI. No acute intracranial abnormality identified. 2. Partially empty sella.   IMPRESSION/PLAN: Seropositive generalized myasthenia gravis s/p thymectomy with exacerbation.  Diagnosed in March 2019 with 3 hospitalizations, no MG crisis requiring intubation.   She was briefly being managed at [facility] by Suzanne Nguyen due to being home for the summer break where she underwent thymectomy in July 2019 and has a course of IVIG and PLEX in early August.  Due to ongoing bulbar weakness, her prednisone was increased  to '60mg'$  daily and she was readmitted in September for plasmapheresis.  She does not have significant response to escalation in corticosteroid therapy.  She completed 5 sessions of plasmapheresis while admitted and no longer has dysphagia or dysarthria.  Diplopia has also improved.  Exam continues to show ptosis, bulbar weakness, and mild ophthalmoplegia; but overall, markedly better than before.  She has responded well to plasmapheresis and  given her refractory myasthenia, I will continue an extended course of this while tapering her prednisone.  Continue plasmapheresis 2 times per week for the next 2 weeks and reassess  - dates have been given to hemodialysis and patient has been scheduled (see telephone note). Reduce prednisone to 50 mg daily  Continue CellCept 500 mg twice daily.  Recheck CBC and CMP next week  Continue Mestinon 90 mg at 6 AM, 11 AM, 3 PM, and 8 PM.  Stop 1am dose.   Hold further IVIG Recommend immunization for influenza which she will discuss with her PCP.   Going forward, she may also need meningococcal vaccination for both strains, in the event [employer] needs to be started.  Long term use of corticosteroids. Side effects were discussed. Continue calcium and vitamin D supplements Continue pepcid  Start Bactrim DS Monday Wednesday Friday for PCP prophylaxis.    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  Strongly urged her to establish care with counselor service which is available on campus  Leukocytosis most likely due to corticosteroid.  No signs of infection.  Reassurance provided.   Sinus tachycardia. This was noticed while hospitalized and started on low dose metoprolol, which she admits to not taking.  I encouraged her to take metoprolol as prescribed.   Return to clinic in 2 weeks   Greater than 50% of this 45 minute visit was spent in counseling, explanation of diagnosis, planning of further management, and coordination of care.   Thank you for allowing me to participate in  patient's care.  If I can answer any additional questions, I would be pleased to do so.    Sincerely,    Jamir Rone K. Posey Pronto, DO

## 2018-02-26 NOTE — Telephone Encounter (Signed)
Please advise 

## 2018-02-26 NOTE — Telephone Encounter (Signed)
[facility] called and is needing to have clarified when this patient is to be seen throughout the week. The patient is saying 1 x a week. Please Call Suzanne Nguyen or speak with Suzanne Nguyen. Thanks

## 2018-02-27 ENCOUNTER — Telehealth: Payer: Self-pay | Admitting: *Deleted

## 2018-02-27 ENCOUNTER — Non-Acute Institutional Stay (HOSPITAL_COMMUNITY)
Admission: RE | Admit: 2018-02-27 | Discharge: 2018-02-27 | Disposition: A | Discharge status: Home / Self Care | Payer: Managed Care, Other (non HMO) | Source: Ambulatory Visit | Attending: Neurology | Admitting: Neurology

## 2018-02-27 ENCOUNTER — Encounter: Payer: Self-pay | Admitting: *Deleted

## 2018-02-27 DIAGNOSIS — Z5181 Encounter for therapeutic drug level monitoring: Secondary | ICD-10-CM | POA: Diagnosis not present

## 2018-02-27 DIAGNOSIS — Z888 Allergy status to other drugs, medicaments and biological substances status: Secondary | ICD-10-CM | POA: Diagnosis not present

## 2018-02-27 DIAGNOSIS — Z91018 Allergy to other foods: Secondary | ICD-10-CM | POA: Diagnosis not present

## 2018-02-27 DIAGNOSIS — Z79899 Other long term (current) drug therapy: Secondary | ICD-10-CM | POA: Insufficient documentation

## 2018-02-27 DIAGNOSIS — G7000 Myasthenia gravis without (acute) exacerbation: Secondary | ICD-10-CM | POA: Diagnosis present

## 2018-02-27 DIAGNOSIS — G7001 Myasthenia gravis with (acute) exacerbation: Secondary | ICD-10-CM | POA: Diagnosis present

## 2018-02-27 DIAGNOSIS — Z9889 Other specified postprocedural states: Secondary | ICD-10-CM | POA: Diagnosis not present

## 2018-02-27 LAB — CBC
HCT: 33.1 % — ABNORMAL LOW (ref 36.0–46.0)
Hemoglobin: 10.1 g/dL — ABNORMAL LOW (ref 12.0–15.0)
MCH: 28.9 pg (ref 26.0–34.0)
MCHC: 30.5 g/dL (ref 30.0–36.0)
MCV: 94.8 fL (ref 78.0–100.0)
Platelets: 169 10*3/uL (ref 150–400)
RBC: 3.49 MIL/uL — ABNORMAL LOW (ref 3.87–5.11)
RDW: 14.9 % (ref 11.5–15.5)
WBC: 13.3 10*3/uL — AB (ref 4.0–10.5)

## 2018-02-27 LAB — RENAL FUNCTION PANEL
ALBUMIN: 4.5 g/dL (ref 3.5–5.0)
Anion gap: 9 (ref 5–15)
BUN: 7 mg/dL (ref 6–20)
CALCIUM: 9 mg/dL (ref 8.9–10.3)
CO2: 23 mmol/L (ref 22–32)
CREATININE: 0.59 mg/dL (ref 0.44–1.00)
Chloride: 111 mmol/L (ref 98–111)
GFR calc Af Amer: >60 mL/min (ref >60)
GLUCOSE: 109 mg/dL — AB (ref 70–99)
PHOSPHORUS: 3.1 mg/dL (ref 2.5–4.6)
POTASSIUM: 2.9 mmol/L — AB (ref 3.5–5.1)
SODIUM: 143 mmol/L (ref 135–145)

## 2018-02-27 LAB — POCT I-STAT, CHEM 8
BUN: 6 mg/dL (ref 6–20)
CALCIUM ION: 1.08 mmol/L — AB (ref 1.15–1.40)
CHLORIDE: 104 mmol/L (ref 98–111)
Creatinine, Ser: 0.6 mg/dL (ref 0.44–1.00)
Glucose, Bld: 105 mg/dL — ABNORMAL HIGH (ref 70–99)
HCT: 31 % — ABNORMAL LOW (ref 36.0–46.0)
Hemoglobin: 10.5 g/dL — ABNORMAL LOW (ref 12.0–15.0)
Potassium: 2.9 mmol/L — ABNORMAL LOW (ref 3.5–5.1)
SODIUM: 142 mmol/L (ref 135–145)
TCO2: 22 mmol/L (ref 22–32)

## 2018-02-27 MED ORDER — CALCIUM CARBONATE ANTACID 500 MG PO CHEW: 2.0000 | CHEWABLE_TABLET | ORAL | Status: DC

## 2018-02-27 MED ORDER — SODIUM CHLORIDE 0.9 % IV SOLN
INTRAVENOUS | Status: AC
  Administered 2018-02-27 (×3): via INTRAVENOUS_CENTRAL
  Filled 2018-02-27 (×3): qty 200

## 2018-02-27 MED ORDER — ACD FORMULA A 0.73-2.45-2.2 GM/100ML VI SOLN
500.0000 mL | Status: DC
  Administered 2018-02-27: 500 mL via INTRAVENOUS

## 2018-02-27 MED ORDER — HEPARIN SODIUM (PORCINE) 1000 UNIT/ML IJ SOLN
1000.0000 [IU] | Freq: Once | INTRAMUSCULAR | Status: AC
  Administered 2018-02-27: 1000 [IU]

## 2018-02-27 MED ORDER — ACETAMINOPHEN 325 MG PO TABS: 650.0000 mg | ORAL_TABLET | ORAL | Status: DC | PRN

## 2018-02-27 MED ORDER — HEPARIN SODIUM (PORCINE) 1000 UNIT/ML IJ SOLN
INTRAMUSCULAR | Status: AC
  Administered 2018-02-27: 1000 [IU]
  Filled 2018-02-27: qty 5

## 2018-02-27 MED ORDER — DIPHENHYDRAMINE HCL 25 MG PO CAPS: 25.0000 mg | ORAL_CAPSULE | Freq: Four times a day (QID) | ORAL | Status: DC | PRN

## 2018-02-27 MED ORDER — CALCIUM CARBONATE ANTACID 500 MG PO CHEW
CHEWABLE_TABLET | ORAL | Status: AC
  Administered 2018-02-27: 400 mg
  Filled 2018-02-27: qty 2

## 2018-02-27 MED ORDER — SODIUM CHLORIDE 0.9 % IV SOLN
3.0000 g | INTRAVENOUS | Status: AC
  Administered 2018-02-27: 3 g via INTRAVENOUS
  Filled 2018-02-27: qty 30

## 2018-02-27 NOTE — Telephone Encounter (Signed)
Opened in error

## 2018-02-27 NOTE — Progress Notes (Signed)
Patient tolerated treatment well. Departed unit alert oriented vitals stable without complaint. Post treatment labs drawn. Advised time of 1300 to arrive for treatment on 03/01/18.

## 2018-03-01 ENCOUNTER — Non-Acute Institutional Stay (HOSPITAL_COMMUNITY)
Admission: RE | Admit: 2018-03-01 | Discharge: 2018-03-01 | Disposition: A | Discharge status: Home / Self Care | Payer: Managed Care, Other (non HMO) | Source: Ambulatory Visit | Attending: Neurology | Admitting: Neurology

## 2018-03-01 DIAGNOSIS — M311 Thrombotic microangiopathy: Secondary | ICD-10-CM | POA: Diagnosis present

## 2018-03-01 LAB — COMPREHENSIVE METABOLIC PANEL
ALBUMIN: 4.2 g/dL (ref 3.5–5.0)
ALT: 16 U/L (ref 0–44)
AST: 24 U/L (ref 15–41)
Alkaline Phosphatase: 24 U/L — ABNORMAL LOW (ref 38–126)
Anion gap: 9 (ref 5–15)
BUN: 9 mg/dL (ref 6–20)
CALCIUM: 9.3 mg/dL (ref 8.9–10.3)
CO2: 27 mmol/L (ref 22–32)
CREATININE: 0.68 mg/dL (ref 0.44–1.00)
Chloride: 104 mmol/L (ref 98–111)
GFR calc Af Amer: >60 mL/min (ref >60)
GFR calc non Af Amer: >60 mL/min (ref >60)
Glucose, Bld: 204 mg/dL — ABNORMAL HIGH (ref 70–99)
Potassium: 4.2 mmol/L (ref 3.5–5.1)
Sodium: 140 mmol/L (ref 135–145)
Total Bilirubin: 0.5 mg/dL (ref 0.3–1.2)
Total Protein: 5.3 g/dL — ABNORMAL LOW (ref 6.5–8.1)

## 2018-03-01 LAB — POCT I-STAT, CHEM 8
BUN: 9 mg/dL (ref 6–20)
CALCIUM ION: 1.26 mmol/L (ref 1.15–1.40)
CREATININE: 0.7 mg/dL (ref 0.44–1.00)
Chloride: 100 mmol/L (ref 98–111)
GLUCOSE: 199 mg/dL — AB (ref 70–99)
HCT: 34 % — ABNORMAL LOW (ref 36.0–46.0)
HEMOGLOBIN: 11.6 g/dL — AB (ref 12.0–15.0)
Potassium: 4.2 mmol/L (ref 3.5–5.1)
Sodium: 140 mmol/L (ref 135–145)
TCO2: 27 mmol/L (ref 22–32)

## 2018-03-01 LAB — CBC
HEMATOCRIT: 36.6 % (ref 36.0–46.0)
Hemoglobin: 11.7 g/dL — ABNORMAL LOW (ref 12.0–15.0)
MCH: 29.5 pg (ref 26.0–34.0)
MCHC: 32 g/dL (ref 30.0–36.0)
MCV: 92.2 fL (ref 78.0–100.0)
Platelets: 199 10*3/uL (ref 150–400)
RBC: 3.97 MIL/uL (ref 3.87–5.11)
RDW: 14.6 % (ref 11.5–15.5)
WBC: 11.9 10*3/uL — AB (ref 4.0–10.5)

## 2018-03-01 MED ORDER — HEPARIN SODIUM (PORCINE) 1000 UNIT/ML IJ SOLN: 1000.0000 [IU] | Freq: Once | INTRAMUSCULAR | Status: AC

## 2018-03-01 MED ORDER — DIPHENHYDRAMINE HCL 25 MG PO CAPS: 25.0000 mg | ORAL_CAPSULE | Freq: Four times a day (QID) | ORAL | Status: DC | PRN

## 2018-03-01 MED ORDER — CALCIUM CARBONATE ANTACID 500 MG PO CHEW
2.0000 | CHEWABLE_TABLET | ORAL | Status: DC
  Administered 2018-03-01: 400 mg via ORAL

## 2018-03-01 MED ORDER — ACD FORMULA A 0.73-2.45-2.2 GM/100ML VI SOLN
500.0000 mL | Status: DC
  Administered 2018-03-01: 500 mL via INTRAVENOUS

## 2018-03-01 MED ORDER — HEPARIN SODIUM (PORCINE) 1000 UNIT/ML IJ SOLN
INTRAMUSCULAR | Status: AC
  Filled 2018-03-01: qty 1

## 2018-03-01 MED ORDER — SODIUM CHLORIDE 0.9 % IV SOLN
INTRAVENOUS | Status: AC
  Administered 2018-03-01 (×3): via INTRAVENOUS_CENTRAL
  Filled 2018-03-01 (×3): qty 200

## 2018-03-01 MED ORDER — SODIUM CHLORIDE 0.9 % IV SOLN
2.0000 g | Freq: Once | INTRAVENOUS | Status: AC
  Administered 2018-03-01: 2 g via INTRAVENOUS
  Filled 2018-03-01: qty 20

## 2018-03-01 MED ORDER — ACD FORMULA A 0.73-2.45-2.2 GM/100ML VI SOLN
Status: AC
  Administered 2018-03-01: 500 mL via INTRAVENOUS
  Filled 2018-03-01: qty 500

## 2018-03-01 MED ORDER — HEPARIN SODIUM (PORCINE) 1000 UNIT/ML IJ SOLN
INTRAMUSCULAR | Status: AC
  Administered 2018-03-01: 3200 [IU]
  Filled 2018-03-01: qty 4

## 2018-03-01 MED ORDER — CALCIUM CARBONATE ANTACID 500 MG PO CHEW
CHEWABLE_TABLET | ORAL | Status: AC
  Filled 2018-03-01: qty 4

## 2018-03-01 MED ORDER — ACETAMINOPHEN 325 MG PO TABS: 650.0000 mg | ORAL_TABLET | ORAL | Status: DC | PRN

## 2018-03-01 NOTE — Progress Notes (Signed)
Pt tolerated tx weill with no adverse events noted.Patient alert/oriented in no acute distress. Denies any pain/discomfort the entire duration of TPE tx. VSS. Pt came in as outpatient and discharge per charge nurse post TPE tx. No complaints voiced. Accompnaied pt down to the main entrance via wheelchair.and ambulated from the wheelchair without  Any issue.

## 2018-03-04 ENCOUNTER — Ambulatory Visit: Payer: Managed Care, Other (non HMO) | Admitting: Family Medicine

## 2018-03-04 ENCOUNTER — Non-Acute Institutional Stay (HOSPITAL_COMMUNITY)
Admission: RE | Admit: 2018-03-04 | Discharge: 2018-03-04 | Disposition: A | Discharge status: Home / Self Care | Payer: Managed Care, Other (non HMO) | Source: Ambulatory Visit | Attending: Neurology | Admitting: Neurology

## 2018-03-04 DIAGNOSIS — G7001 Myasthenia gravis with (acute) exacerbation: Secondary | ICD-10-CM | POA: Insufficient documentation

## 2018-03-04 LAB — POCT I-STAT, CHEM 8
BUN: 10 mg/dL (ref 6–20)
Calcium, Ion: 1.25 mmol/L (ref 1.15–1.40)
Chloride: 101 mmol/L (ref 98–111)
Creatinine, Ser: 0.7 mg/dL (ref 0.44–1.00)
Glucose, Bld: 180 mg/dL — ABNORMAL HIGH (ref 70–99)
HEMATOCRIT: 36 % (ref 36.0–46.0)
HEMOGLOBIN: 12.2 g/dL (ref 12.0–15.0)
Potassium: 4.2 mmol/L (ref 3.5–5.1)
Sodium: 140 mmol/L (ref 135–145)
TCO2: 28 mmol/L (ref 22–32)

## 2018-03-04 MED ORDER — CALCIUM CARBONATE ANTACID 500 MG PO CHEW
CHEWABLE_TABLET | ORAL | Status: AC
  Administered 2018-03-04: 400 mg via ORAL
  Filled 2018-03-04: qty 2

## 2018-03-04 MED ORDER — CALCIUM CARBONATE ANTACID 500 MG PO CHEW
CHEWABLE_TABLET | ORAL | Status: AC
  Filled 2018-03-04: qty 2

## 2018-03-04 MED ORDER — DIPHENHYDRAMINE HCL 25 MG PO CAPS: 25.0000 mg | ORAL_CAPSULE | Freq: Four times a day (QID) | ORAL | Status: DC | PRN

## 2018-03-04 MED ORDER — SODIUM CHLORIDE 0.9 % IV SOLN
2.0000 g | Freq: Once | INTRAVENOUS | Status: AC
  Administered 2018-03-04: 2 g via INTRAVENOUS
  Filled 2018-03-04: qty 20

## 2018-03-04 MED ORDER — HEPARIN SODIUM (PORCINE) 1000 UNIT/ML IJ SOLN
1000.0000 [IU] | Freq: Once | INTRAMUSCULAR | Status: AC
  Administered 2018-03-04: 1000 [IU]

## 2018-03-04 MED ORDER — SODIUM CHLORIDE 0.9 % IV SOLN
INTRAVENOUS | Status: AC
  Administered 2018-03-04 (×3): via INTRAVENOUS_CENTRAL
  Filled 2018-03-04 (×3): qty 200

## 2018-03-04 MED ORDER — ACD FORMULA A 0.73-2.45-2.2 GM/100ML VI SOLN
Status: AC
  Filled 2018-03-04: qty 500

## 2018-03-04 MED ORDER — ACETAMINOPHEN 325 MG PO TABS
ORAL_TABLET | ORAL | Status: AC
  Administered 2018-03-04: 650 mg via ORAL
  Filled 2018-03-04: qty 2

## 2018-03-04 MED ORDER — CALCIUM CARBONATE ANTACID 500 MG PO CHEW
2.0000 | CHEWABLE_TABLET | ORAL | Status: AC
  Administered 2018-03-04 (×2): 400 mg via ORAL

## 2018-03-04 MED ORDER — HEPARIN SODIUM (PORCINE) 1000 UNIT/ML IJ SOLN
INTRAMUSCULAR | Status: AC
  Administered 2018-03-04: 1000 [IU]
  Filled 2018-03-04: qty 3

## 2018-03-04 MED ORDER — ACETAMINOPHEN 325 MG PO TABS
650.0000 mg | ORAL_TABLET | ORAL | Status: DC | PRN
  Administered 2018-03-04: 650 mg via ORAL

## 2018-03-04 MED ORDER — ACD FORMULA A 0.73-2.45-2.2 GM/100ML VI SOLN: 500.0000 mL | Status: DC

## 2018-03-10 ENCOUNTER — Encounter: Payer: Self-pay | Admitting: Family Medicine

## 2018-03-11 ENCOUNTER — Ambulatory Visit (HOSPITAL_COMMUNITY)
Admission: RE | Admit: 2018-03-11 | Discharge: 2018-03-11 | Disposition: A | Discharge status: Home / Self Care | Payer: Managed Care, Other (non HMO) | Source: Ambulatory Visit | Attending: Neurology | Admitting: Neurology

## 2018-03-11 DIAGNOSIS — G7000 Myasthenia gravis without (acute) exacerbation: Secondary | ICD-10-CM | POA: Insufficient documentation

## 2018-03-11 LAB — POCT I-STAT, CHEM 8
BUN: 8 mg/dL (ref 6–20)
CALCIUM ION: 1.3 mmol/L (ref 1.15–1.40)
Chloride: 100 mmol/L (ref 98–111)
Creatinine, Ser: 0.7 mg/dL (ref 0.44–1.00)
Glucose, Bld: 140 mg/dL — ABNORMAL HIGH (ref 70–99)
HEMATOCRIT: 42 % (ref 36.0–46.0)
HEMOGLOBIN: 14.3 g/dL (ref 12.0–15.0)
Potassium: 4 mmol/L (ref 3.5–5.1)
Sodium: 139 mmol/L (ref 135–145)
TCO2: 30 mmol/L (ref 22–32)

## 2018-03-11 MED ORDER — HEPARIN SODIUM (PORCINE) 1000 UNIT/ML IJ SOLN: 1000.0000 [IU] | Freq: Once | INTRAMUSCULAR | Status: DC

## 2018-03-11 MED ORDER — CALCIUM CARBONATE ANTACID 500 MG PO CHEW
CHEWABLE_TABLET | ORAL | Status: AC
  Administered 2018-03-11: 400 mg via ORAL
  Filled 2018-03-11: qty 2

## 2018-03-11 MED ORDER — ACD FORMULA A 0.73-2.45-2.2 GM/100ML VI SOLN
Status: AC
  Filled 2018-03-11: qty 500

## 2018-03-11 MED ORDER — HEPARIN SODIUM (PORCINE) 1000 UNIT/ML IJ SOLN
INTRAMUSCULAR | Status: AC
  Filled 2018-03-11: qty 1

## 2018-03-11 MED ORDER — SODIUM CHLORIDE 0.9 % IV SOLN
INTRAVENOUS | Status: AC
  Administered 2018-03-11 (×2): via INTRAVENOUS_CENTRAL
  Filled 2018-03-11 (×3): qty 200

## 2018-03-11 MED ORDER — SODIUM CHLORIDE 0.9 % IV SOLN
2.0000 g | Freq: Once | INTRAVENOUS | Status: DC
  Filled 2018-03-11: qty 20

## 2018-03-11 MED ORDER — ACETAMINOPHEN 325 MG PO TABS
ORAL_TABLET | ORAL | Status: AC
  Administered 2018-03-11: 650 mg via ORAL
  Filled 2018-03-11: qty 2

## 2018-03-11 MED ORDER — ACETAMINOPHEN 325 MG PO TABS
650.0000 mg | ORAL_TABLET | ORAL | Status: DC | PRN
  Administered 2018-03-11: 650 mg via ORAL

## 2018-03-11 MED ORDER — CALCIUM CARBONATE ANTACID 500 MG PO CHEW
2.0000 | CHEWABLE_TABLET | ORAL | Status: AC
  Administered 2018-03-11 (×2): 400 mg via ORAL

## 2018-03-11 MED ORDER — ACD FORMULA A 0.73-2.45-2.2 GM/100ML VI SOLN: 500.0000 mL | Status: DC

## 2018-03-11 MED ORDER — DIPHENHYDRAMINE HCL 25 MG PO CAPS: 25.0000 mg | ORAL_CAPSULE | Freq: Four times a day (QID) | ORAL | Status: DC | PRN

## 2018-03-11 NOTE — Progress Notes (Signed)
Alerted the office of Dr. Narda Amber regarding patient's heart rate running 130's -147. Patient started out @ 118. Patient advised she lost the prescription for her BP medicine which was given at discharge from hospital. Dr. Posey Pronto states she  will call primary care physician to clear up the matter.

## 2018-03-12 ENCOUNTER — Other Ambulatory Visit: Payer: Self-pay | Admitting: *Deleted

## 2018-03-12 ENCOUNTER — Other Ambulatory Visit: Payer: Self-pay | Admitting: Neurology

## 2018-03-12 ENCOUNTER — Telehealth: Payer: Self-pay | Admitting: Neurology

## 2018-03-12 MED ORDER — METOPROLOL SUCCINATE ER 25 MG PO TB24: 12.5000 mg | ORAL_TABLET | Freq: Every day | ORAL | 0 refills | Status: DC

## 2018-03-12 NOTE — Telephone Encounter (Signed)
I called patient and informed her that she needs to be taking the metoprolol daily.  She said that she only takes it once a day instead of twice.  I will send in Rx.

## 2018-03-12 NOTE — Telephone Encounter (Signed)
Rec'd call from plasmapheresis last night that patient HR was elevated from 110-130s and asymptomatic.  She has sinus tachycardia while hospitalized and was started on metoprolol 12.5mg  BID which patient has not been taking.  She has rescheduled her NP appointment with PCP to 10/21.    Please find out if patient has this prescription. If not, ok to give 30-day supply of metoprolol 12.5mg  twice daily, which was started in the hospital and she would need to request future refills through PCP, as this is not a condition that I manage.  Colie Fugitt K. Posey Pronto, DO

## 2018-03-14 ENCOUNTER — Encounter: Payer: Self-pay | Admitting: Neurology

## 2018-03-14 ENCOUNTER — Ambulatory Visit: Payer: Managed Care, Other (non HMO) | Admitting: Neurology

## 2018-03-14 VITALS — BP 104/70 | HR 98 | Ht 66.0 in | Wt 198.2 lb

## 2018-03-14 DIAGNOSIS — Z9089 Acquired absence of other organs: Secondary | ICD-10-CM

## 2018-03-14 DIAGNOSIS — G7001 Myasthenia gravis with (acute) exacerbation: Secondary | ICD-10-CM | POA: Diagnosis not present

## 2018-03-14 MED ORDER — PREDNISONE 20 MG PO TABS: 40.0000 mg | ORAL_TABLET | Freq: Every day | ORAL | 2 refills | Status: DC

## 2018-03-14 NOTE — Patient Instructions (Addendum)
Reduce prednisone 40mg  daily  Continue plasmapheresis this Friday (10/11) and next Friday.   Check CBC, CMP, and HbA1c  We will call you with a follow-up visit

## 2018-03-14 NOTE — Progress Notes (Signed)
Follow-up Visit   Date: 03/14/18    Suzanne Nguyen MRN: [id number] DOB: [birth year]   Interim History: Suzanne Nguyen is a 22 y.o. 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 the patient was accompanied to the clinic by self.  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  She was on spring break at home in [place] when she began noticing that her smile in photos appeared flat.  This was followed by difficulty chewing and especially getting tired easily. Over the next week, her right eye started to droop and she developed double vision.  After she returned to [facility], she saw her eye doctor who sent her to [facility] for further evaluation and was admitted from 3/19 - 3/22.  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  There was high clinical suspicion for myasthenia and acetylcholine antibodies returned positive.  She was started on mestinon 36m TID and prednisone 128m  She takes mestinon 3072mt 7am, 3pm, and 10pm which has improved her ability to open her eyes.  She establish care with me in early April, at which time I started prednisone 30 mg which did not have marked benefit.  Due to her progressive weakness, IVIG was started in late April, which improved much of her bulbar weakness.  She was continued on monthly IVIG over the summer when she returned home and while [facility]  She has been home for the summer in [place] [facility].  She underwent robotic assisted thymectomy on 7/16 which required taking the left phrenic nerve.  Following surgery, she received IVIG, increased prednisone  to 65m16mily and then again admitted for plasmapheresis, which was completed in early August.  Her prednisone was increased to 60mg79mly and she has noticed improved chewing and double vision has improved since being on this dose, but legs feel weak.  She does not have shortness of breath or difficulty swallowing.  Double vision especially to the left is unchanged.  She is living on campus apartment on the 3rd floor and has to take frequent breaks to get to her apartment as there is no elevator.  I will write a letter for her apartment complex to make accommodations for first floor apartment.   She endorses having a lot of anxiety and stress related to her health, especially now that she is no longer with her family who live in [place].  She is planning on continued to finish her college here. Current medication regimen includes:  Mestinon 120mg 17my 4 hours (2am, 6am, 10am, 2pm, 6pm, 10pm) = 720mg/d51mednisone 60mg da52mwhich she has been for the past 10 days, and IVIG (she does not recall her last dose).   She will be started Cellcept 500mg BID37may.   She endorses GI cramping.   UPDATE 02/26/2018:  Her last visit, we discussed reducing her Mestinon due to concern of cholinergic weakness and she had appreciated less cramping and initially weakness was better.  However, over the next few days, she started having difficulty swallowing and worsening double vision, [facility] - 02/25/2018, which significantly helped her  symptoms. She continues to have droopiness of the eyes, mild facial weakness, and trace double vision. She does not have problems with swallowing, shortness of breath, or limb weakness.  She can eat normal textured foods, but takes breaks chewing harder foods like salads.  She is taking mestinon 56m at 1am, 6am, 11am, 3pm, 8pm. She skipped her 1am dose a few times and did not have any breakthrough weakness. She remains on prednsone  665mand Cellcept 50058mID.  UPDATE 03/14/2018:  She is here for follow-up visit.  She has been getting out-patient PLEX 1-2 times per week and has noticed improved swallowing, double vision, and chewing.  She has mild droopiness of the right eye in the morning and double vision with upgaze, otherwise is doing quite well.  She has developed some tachycardia, especially notable at her a pheresis sessions.  He was given a prescription for metoprolol 12.5 mg daily from the hospital, however she is not taking this and lost the prescription.  She was explained that tachycardia is beyond my realm of expertise and she would need to discuss this with her primary care doctor, once she establishes care.  I have given her a 30-day supply for this.  She is taking mestinon 41m58m 6am, 11a, 3pm, and 8pm.  She remains on prednisone 50mg45mly.  Her blood glucose levels on routine lab testing does show elevated levels concerning for steroid-induced hyperglycemia.  She does have an upper respiratory viral infection and is treating this with Mucinex.  Medications:  Current Outpatient Medications on File Prior to Visit  Medication Sig Dispense Refill  . CALCIUM PO Take 600 mg by mouth 2 (two) times daily. With vitamin D3 800 units    . cholecalciferol (VITAMIN D) 1000 units tablet Take 1,000 Units by mouth daily.    . ibuMarland Kitchenrofen (ADVIL,MOTRIN) 200 MG tablet Take 200 mg by mouth every 6 (six) hours as needed for mild pain.    . metoprolol succinate (TOPROL XL) 25 MG 24 hr tablet Take 0.5 tablets (12.5 mg total) by mouth daily. 15 tablet 0  . Multiple Vitamin (MULTIVITAMIN) tablet Take 1 tablet by mouth daily.    . mycophenolate (CELLCEPT) 500 MG tablet Take 1 tablet (500 mg total) by mouth 2 (two) times daily. 60 tablet 0  . pyridostigmine (MESTINON) 60 MG tablet Take 1.5 tablet at 6am, 11am, 3pm, and 8pm. 240 tablet 0  . sulfamethoxazole-trimethoprim (BACTRIM DS,SEPTRA DS) 800-160 MG tablet Take 1 tablet by mouth  every Monday, Wednesday, and Friday. 30 tablet 1   No current facility-administered medications on file prior to visit.     Allergies:  Allergies  Allergen Reactions  . Magnesium-Containing Compounds Anaphylaxis    paralysis  . Corticosteroids     Worsen symptoms of MYASTHENIA GRAVES  . Other     Mycins  . Procainamide     Irregular Heart beat  . Soy Allergy Itching    Review of Systems:  CONSTITUTIONAL: No fevers, chills, night sweats, or weight loss.  EYES: +visual changes or eye pain ENT: No hearing changes.  No history of nose bleeds.   RESPIRATORY: No cough, wheezing and shortness of breath.   CARDIOVASCULAR: Negative for chest pain, and palpitations.   GI: No for abdominal discomfort, blood in stools or black stools.  No recent change in bowel habits.   GU:  No history of incontinence.   MUSCLOSKELETAL: No history of joint pain or swelling.  No myalgias.   SKIN: Negative for lesions, rash,  and itching.   ENDOCRINE: Negative for cold or heat intolerance, polydipsia or goiter.   PSYCH:  No depression +anxiety symptoms.   NEURO: As Above.   Vital Signs:  BP 104/70   Pulse 98   Ht _0  (1.676 m)   Wt 198 lb 4 oz (89.9 kg)   SpO2 97%   BMI 32.00 kg/m    General Medical Exam:   General:  Well-appearing, comfortable.   Eyes/ENT: see cranial nerve examination.   Neck: No masses appreciated.  Full range of motion without tenderness.  No carotid bruits. Respiratory:  Clear to auscultation, good air entry bilaterally.   Cardiac:  Regular rate and rhythm, no murmur.   Ext:  No edema  Neurological Exam: MENTAL STATUS including orientation to time, place, person, recent and remote memory, attention span and concentration, language, and fund of knowledge is normal.  Speech is not dysarthric.  CRANIAL NERVES:  Pupils equal round and reactive to light.  There is mild right ptosis, worse with sustained up gaze.  Lateral gaze has markedly improved and there is no longer any  weakness, there is very trace upgaze weakness in the right eye.  Facial muscles are stronger with 5/5 orbicularis oris, orbicularis oculi, and frontalis.  She has mild buccinator weakness.  Smile continues to be transverse. Palate elevates symmetrically.  Tongue is midline and strength is 5/5.  Tongue side-to-side movements are intact.Marland Kitchen  MOTOR:  Motor strength is 5/5 in all extremities, including neck flexion. No fatigability. No pronator drift.  Tone is normal.    COORDINATION/GAIT: Gait narrow based and stable.     Data: Labs 08/22/2017:  AChR binding 7.86*, blocking 73*, modulating 58*, TSH 0.98, SSA/B neg, ACE 62, ANA neg, HIV neg, CRP 3.3*, ESR 28*  Lab Results  Component Value Date   CREATININE 0.70 03/11/2018   BUN 8 03/11/2018   NA 139 03/11/2018   K 4.0 03/11/2018   CL 100 03/11/2018   CO2 27 03/01/2018   Lab Results  Component Value Date   WBC 11.9 (H) 03/01/2018   HGB 14.3 03/11/2018   HCT 42.0 03/11/2018   MCV 92.2 03/01/2018   PLT 199 03/01/2018    CT head and neck w contrast 08/22/2017:   1. Anterior mediastinal mass, most consistent with thymoma. This supports the clinical concern for myasthenia gravis. 2. No arterial abnormality of the head or neck. 3. No intracranial abnormality.  MRI brain wwo contrast 08/22/2017:   1. Negative brain MRI. No acute intracranial abnormality identified. 2. Partially empty sella.   IMPRESSION/PLAN: Seropositive generalized myasthenia gravis s/p thymectomy with exacerbation.  Diagnosed in March 2019 with 3 hospitalizations, no MG crisis requiring intubation.   She was briefly being managed at [facility] by Dr. Thomasene Lot due to being home for the summer break where she underwent thymectomy in July 2019 and has a course of IVIG and PLEX in early August.  Due to persistent bulbar weakness, her prednisone was increased to 68m daily and she was readmitted in September for plasmapheresis.  She does not have significant  response to escalation in corticosteroid therapy, therefore will reduce prednisone to 40 mg daily.  She completed 5 sessions of plasmapheresis while hospitalized and had resolution of dysarthria and improve dysphasia.  She has been continued on a tapering plasmapheresis course with 1-2 sessions weekly and has improved diplopia, ptosis, and generalized weakness.  On exam, she has trace right ptosis, upgaze weakness on the right eye, and buccinator weakness.  She has responded well to plasmapheresis and given her refractory myasthenia, I will continue plasmapheresis once a week for the next 3 weeks and then start to taper every 10 days.   Reduce prednisone to 40 mg daily  Continue CellCept 500 mg twice daily.  Recheck CBC, CMP, and hemoglobin A1c  Continue Mestinon 90 mg at 6 AM, 11 AM, 3 PM, and 8 PM.     Recommend immunization for influenza which she will discuss with her PCP.   Going forward, she may also need meningococcal vaccination for both strains, in the event [employer] needs to be started.  Long term use of corticosteroids.  Check hemoglobin A1c, as her routine labs indicate steroid-induced hyperglycemia  Continue calcium and vitamin D supplements Continue pepcid  Continue Bactrim DS Monday Wednesday Friday for PCP prophylaxis.    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  She continues to be highly anxious about any symptoms, and feels that her myasthenia is getting worse.  Again I educated her that myasthenia is strictly neuromuscular condition, and does not manifest with pain, congestion, or changes in blood pressure, or tachycardia.    Sinus tachycardia. This was noticed while hospitalized and started on low dose metoprolol, which she admits to not taking.  I encouraged her to take metoprolol as prescribed.  Follow-up with primary care doctor for ongoing management.  Return to clinic in 3 weeks  Greater than 50% of this 30 minute visit was spent in counseling, explanation of diagnosis, planning  of further management, and coordination of care.   Thank you for allowing me to participate in patient's care.  If I can answer any additional questions, I would be pleased to do so.    Sincerely,    Donika K. Posey Pronto, DO

## 2018-03-15 ENCOUNTER — Non-Acute Institutional Stay (HOSPITAL_COMMUNITY)
Admission: AD | Admit: 2018-03-15 | Discharge: 2018-03-15 | Disposition: A | Discharge status: Home / Self Care | Payer: Managed Care, Other (non HMO) | Source: Ambulatory Visit | Attending: Neurology | Admitting: Neurology

## 2018-03-15 DIAGNOSIS — G7001 Myasthenia gravis with (acute) exacerbation: Secondary | ICD-10-CM | POA: Diagnosis present

## 2018-03-15 LAB — COMPREHENSIVE METABOLIC PANEL
ALK PHOS: 31 U/L — AB (ref 38–126)
ALT: 20 U/L (ref 0–44)
ANION GAP: 8 (ref 5–15)
AST: 24 U/L (ref 15–41)
Albumin: 4.3 g/dL (ref 3.5–5.0)
BILIRUBIN TOTAL: 0.6 mg/dL (ref 0.3–1.2)
BUN: 10 mg/dL (ref 6–20)
CO2: 28 mmol/L (ref 22–32)
CREATININE: 0.64 mg/dL (ref 0.44–1.00)
Calcium: 9.6 mg/dL (ref 8.9–10.3)
Chloride: 106 mmol/L (ref 98–111)
GFR calc non Af Amer: >60 mL/min (ref >60)
GLUCOSE: 130 mg/dL — AB (ref 70–99)
Potassium: 3.9 mmol/L (ref 3.5–5.1)
Sodium: 142 mmol/L (ref 135–145)
TOTAL PROTEIN: 5.9 g/dL — AB (ref 6.5–8.1)

## 2018-03-15 LAB — CBC
HCT: 39.3 % (ref 36.0–46.0)
Hemoglobin: 12.6 g/dL (ref 12.0–15.0)
MCH: 28.9 pg (ref 26.0–34.0)
MCHC: 32.1 g/dL (ref 30.0–36.0)
MCV: 90.1 fL (ref 80.0–100.0)
NRBC: 0 % (ref 0.0–0.2)
Platelets: 211 10*3/uL (ref 150–400)
RBC: 4.36 MIL/uL (ref 3.87–5.11)
RDW: 13.7 % (ref 11.5–15.5)
WBC: 12.8 10*3/uL — ABNORMAL HIGH (ref 4.0–10.5)

## 2018-03-15 LAB — POCT I-STAT, CHEM 8
BUN: 10 mg/dL (ref 6–20)
CALCIUM ION: 1.25 mmol/L (ref 1.15–1.40)
CREATININE: 0.7 mg/dL (ref 0.44–1.00)
Chloride: 101 mmol/L (ref 98–111)
Glucose, Bld: 124 mg/dL — ABNORMAL HIGH (ref 70–99)
HCT: 40 % (ref 36.0–46.0)
HEMOGLOBIN: 13.6 g/dL (ref 12.0–15.0)
Potassium: 3.9 mmol/L (ref 3.5–5.1)
Sodium: 141 mmol/L (ref 135–145)
TCO2: 30 mmol/L (ref 22–32)

## 2018-03-15 LAB — HEMOGLOBIN A1C
HEMOGLOBIN A1C: 5.3 % (ref 4.8–5.6)
Mean Plasma Glucose: 105.41 mg/dL

## 2018-03-15 MED ORDER — ACD FORMULA A 0.73-2.45-2.2 GM/100ML VI SOLN
500.0000 mL | Status: DC
  Administered 2018-03-15: 500 mL via INTRAVENOUS

## 2018-03-15 MED ORDER — SODIUM CHLORIDE 0.9 % IV SOLN
3.0000 g | Freq: Once | INTRAVENOUS | Status: AC
  Administered 2018-03-15: 3 g via INTRAVENOUS
  Filled 2018-03-15: qty 30

## 2018-03-15 MED ORDER — CALCIUM GLUCONATE 10 % IV SOLN: 3.0000 g | Freq: Once | INTRAVENOUS | Status: DC

## 2018-03-15 MED ORDER — DIPHENHYDRAMINE HCL 25 MG PO CAPS: 25.0000 mg | ORAL_CAPSULE | Freq: Four times a day (QID) | ORAL | Status: DC | PRN

## 2018-03-15 MED ORDER — HEPARIN SODIUM (PORCINE) 1000 UNIT/ML IJ SOLN: 1000.0000 [IU] | Freq: Once | INTRAMUSCULAR | Status: AC

## 2018-03-15 MED ORDER — ACETAMINOPHEN 325 MG PO TABS: 650.0000 mg | ORAL_TABLET | ORAL | Status: DC | PRN

## 2018-03-15 MED ORDER — SODIUM CHLORIDE 0.9 % IV SOLN
INTRAVENOUS | Status: AC
  Administered 2018-03-15 (×3): via INTRAVENOUS_CENTRAL
  Filled 2018-03-15 (×3): qty 200

## 2018-03-15 MED ORDER — CALCIUM CARBONATE ANTACID 500 MG PO CHEW
CHEWABLE_TABLET | ORAL | Status: AC
  Filled 2018-03-15: qty 4

## 2018-03-15 MED ORDER — CALCIUM CARBONATE ANTACID 500 MG PO CHEW
4.0000 | CHEWABLE_TABLET | Freq: Once | ORAL | Status: AC
  Administered 2018-03-15: 800 mg via ORAL

## 2018-03-15 MED ORDER — HEPARIN SODIUM (PORCINE) 1000 UNIT/ML IJ SOLN
3.2000 mL | Freq: Once | INTRAMUSCULAR | Status: AC
  Administered 2018-03-15: 3200 [IU] via INTRAVENOUS

## 2018-03-15 MED ORDER — HEPARIN SODIUM (PORCINE) 1000 UNIT/ML IJ SOLN
INTRAMUSCULAR | Status: AC
  Filled 2018-03-15: qty 4

## 2018-03-15 MED ORDER — ACD FORMULA A 0.73-2.45-2.2 GM/100ML VI SOLN
Status: AC
  Filled 2018-03-15: qty 500

## 2018-03-15 NOTE — Progress Notes (Signed)
Pt tolerated TPE tx well with no s/sx of adverse events noted. Alert/oriented in no apparent distress. VSS. Ambulatory and requested to be wheeled downstairs in the [place] main entrance.

## 2018-03-23 ENCOUNTER — Non-Acute Institutional Stay (HOSPITAL_COMMUNITY)
Admission: RE | Admit: 2018-03-23 | Discharge: 2018-03-23 | Disposition: A | Discharge status: Home / Self Care | Payer: Managed Care, Other (non HMO) | Attending: Neurology | Admitting: Neurology

## 2018-03-23 DIAGNOSIS — G7001 Myasthenia gravis with (acute) exacerbation: Secondary | ICD-10-CM | POA: Diagnosis present

## 2018-03-23 LAB — POCT I-STAT, CHEM 8
BUN: 8 mg/dL (ref 6–20)
CHLORIDE: 100 mmol/L (ref 98–111)
Calcium, Ion: 1.27 mmol/L (ref 1.15–1.40)
Creatinine, Ser: 0.8 mg/dL (ref 0.44–1.00)
Glucose, Bld: 128 mg/dL — ABNORMAL HIGH (ref 70–99)
HEMATOCRIT: 38 % (ref 36.0–46.0)
Hemoglobin: 12.9 g/dL (ref 12.0–15.0)
Potassium: 4.1 mmol/L (ref 3.5–5.1)
SODIUM: 139 mmol/L (ref 135–145)
TCO2: 31 mmol/L (ref 22–32)

## 2018-03-23 MED ORDER — HEPARIN SODIUM (PORCINE) 1000 UNIT/ML IJ SOLN
INTRAMUSCULAR | Status: AC
  Filled 2018-03-23: qty 3

## 2018-03-23 MED ORDER — ACD FORMULA A 0.73-2.45-2.2 GM/100ML VI SOLN
Status: AC
  Administered 2018-03-23: 500 mL via INTRAVENOUS
  Filled 2018-03-23: qty 500

## 2018-03-23 MED ORDER — SODIUM CHLORIDE 0.9 % IV SOLN
INTRAVENOUS | Status: AC
  Administered 2018-03-23 (×2): via INTRAVENOUS_CENTRAL
  Filled 2018-03-23 (×3): qty 200

## 2018-03-23 MED ORDER — CALCIUM CARBONATE ANTACID 500 MG PO CHEW
2.0000 | CHEWABLE_TABLET | ORAL | Status: DC
  Administered 2018-03-23: 400 mg via ORAL

## 2018-03-23 MED ORDER — SODIUM CHLORIDE 0.9 % IV SOLN
2.0000 g | Freq: Once | INTRAVENOUS | Status: AC
  Administered 2018-03-23: 2 g via INTRAVENOUS
  Filled 2018-03-23: qty 20

## 2018-03-23 MED ORDER — HEPARIN SODIUM (PORCINE) 1000 UNIT/ML IJ SOLN
1000.0000 [IU] | Freq: Once | INTRAMUSCULAR | Status: AC
  Administered 2018-03-23: 1000 [IU]

## 2018-03-23 MED ORDER — DIPHENHYDRAMINE HCL 25 MG PO CAPS: 25.0000 mg | ORAL_CAPSULE | Freq: Four times a day (QID) | ORAL | Status: DC | PRN

## 2018-03-23 MED ORDER — CALCIUM CARBONATE ANTACID 500 MG PO CHEW
CHEWABLE_TABLET | ORAL | Status: AC
  Administered 2018-03-23: 200 mg
  Filled 2018-03-23: qty 2

## 2018-03-23 MED ORDER — ACD FORMULA A 0.73-2.45-2.2 GM/100ML VI SOLN
500.0000 mL | Status: DC
  Administered 2018-03-23: 500 mL via INTRAVENOUS

## 2018-03-23 MED ORDER — ACETAMINOPHEN 325 MG PO TABS: 650.0000 mg | ORAL_TABLET | ORAL | Status: DC | PRN

## 2018-03-25 ENCOUNTER — Encounter: Payer: Self-pay | Admitting: Family Medicine

## 2018-03-25 ENCOUNTER — Other Ambulatory Visit: Payer: Self-pay

## 2018-03-25 ENCOUNTER — Ambulatory Visit: Payer: Managed Care, Other (non HMO) | Attending: Family Medicine | Admitting: Family Medicine

## 2018-03-25 VITALS — BP 133/87 | HR 96 | Temp 98.0°F | Resp 16 | Ht 66.0 in | Wt 199.4 lb

## 2018-03-25 DIAGNOSIS — E669 Obesity, unspecified: Secondary | ICD-10-CM | POA: Insufficient documentation

## 2018-03-25 DIAGNOSIS — E236 Other disorders of pituitary gland: Secondary | ICD-10-CM | POA: Diagnosis not present

## 2018-03-25 DIAGNOSIS — G7000 Myasthenia gravis without (acute) exacerbation: Secondary | ICD-10-CM | POA: Diagnosis not present

## 2018-03-25 DIAGNOSIS — Z6832 Body mass index (BMI) 32.0-32.9, adult: Secondary | ICD-10-CM | POA: Insufficient documentation

## 2018-03-25 DIAGNOSIS — R000 Tachycardia, unspecified: Secondary | ICD-10-CM | POA: Diagnosis not present

## 2018-03-25 DIAGNOSIS — E282 Polycystic ovarian syndrome: Secondary | ICD-10-CM | POA: Insufficient documentation

## 2018-03-25 DIAGNOSIS — Z7952 Long term (current) use of systemic steroids: Secondary | ICD-10-CM | POA: Diagnosis not present

## 2018-03-25 DIAGNOSIS — Z8249 Family history of ischemic heart disease and other diseases of the circulatory system: Secondary | ICD-10-CM | POA: Insufficient documentation

## 2018-03-25 LAB — GLUCOSE, POCT (MANUAL RESULT ENTRY): POC Glucose: 95 mg/dL (ref 70–99)

## 2018-03-25 NOTE — Progress Notes (Signed)
Subjective:    Patient ID: Suzanne Nguyen, female    DOB: 03/25/1996, 22 y.o.   MRN: [id number]  HPI 22 year old female new to the practice.  Patient reports a history of myasthenia gravis and patient is status post surgery this summer for thymectomy.  Patient reports that when she leans over her desk while studying, she does have some discomfort in her lower chest/upper abdomen at the site where she had prior surgery.  Patient also with complaint of continued fatigue.  Patient also has had some episodes of increased heart rate.  Patient states that she was prescribed a medication to help control her heart rate however patient states that she wants to take as few medications as possible and never started this medication.  Patient also states that she has made dietary changes and is no longer eating breads that is making healthier food choices and has lost weight because she was concerned about weight gain and possible diabetes from her long-term use of prednisone.  Patient states that she normally tries to make her appointments early in the morning because she is more fatigued by the end of the day.  Patient states that she currently has some fatigue but primarily her eyelids are tired and she keeps closing her eyes because of that reason.      Patient states that since her thymectomy this summer, which also involved removal of her left phrenic nerve, she has difficulty lying down flat on her back or on her right side as it causes her to feel short of breath.  Patient denies any cough.  No shortness of breath with exertion.  Patient denies any recent fever or chills, no headaches.  Patient has missed her most recent, but states that she has a history of PCOS and her periods have been irregular in the past.  Patient denies possibility of current pregnancy. Past Medical History:  Diagnosis Date  . Bronchitis   . Myasthenia gravis associated with thymoma ([place])   . PCOS (polycystic ovarian syndrome)     . Pneumonia    Past Surgical History:  Procedure Laterality Date  . IR FLUORO GUIDE CV LINE RIGHT  02/17/2018  . IR US GUIDE VASC ACCESS RIGHT  02/17/2018  . None     Family History  Problem Relation Age of Onset  . Gastric cancer Mother   . Hypertension Mother   . Diabetes Father   . Hypertension Father   . Heart disease Father   . Cancer Maternal Grandmother   . Heart disease Maternal Grandmother   . Lung cancer Maternal Grandfather   . Heart disease Maternal Grandfather   . Stroke Maternal Grandfather   . Diabetes Maternal Grandfather   . Diabetes Paternal Grandmother    Social History   Tobacco Use  . Smoking status: Never Smoker  . Smokeless tobacco: Never Used  Substance Use Topics  . Alcohol use: No  . Drug use: No   Allergies  Allergen Reactions  . Magnesium-Containing Compounds Anaphylaxis    paralysis  . Corticosteroids     Worsen symptoms of MYASTHENIA GRAVES  . Other Other (See Comments)    Mycins  . Procainamide     Irregular Heart beat  . Soy Allergy Itching     Review of Systems  Constitutional: Positive for fatigue. Negative for chills, diaphoresis and fever.  HENT: Negative for sore throat and trouble swallowing.   Respiratory: Negative for cough and shortness of breath.   Cardiovascular: Negative for chest pain, palpitations  and leg swelling.       Left lateral chest wall pain at surgical site for thymectomy done this summer  Gastrointestinal: Negative for abdominal pain.  Endocrine: Negative for polydipsia, polyphagia and polyuria.  Genitourinary: Negative for dysuria and frequency.  Musculoskeletal: Positive for back pain. Negative for arthralgias, gait problem, joint swelling and myalgias.  Neurological: Negative for dizziness and headaches.       Objective:   Physical Exam BP 133/87   Pulse 96   Temp 98 F (36.7 C) (Oral)   Resp 16   Ht 5\' 6"  (1.676 m)   Wt 199 lb 6.4 oz (90.4 kg)   SpO2 96%   BMI 32.18 kg/m Vital signs and  nurse's notes reviewed General-well-nourished, well-developed overweight younger adult female in no acute distress.  Patient appears fatigued but states that actually only her eyelids are really tired and that is why she keeps closing her eyes.  Patient does admit to increased fatigue since it is the end of the day and she attended classes earlier today. ENT- TMs dull, patient with moderate edema of the nasal turbinates, patient with mild posterior pharynx erythema.  Cranial nerves II through XII are grossly intact but patient does have some mild weakness/decreased ability to keep her eyelids closed against resistance Neck-supple, patient with thyromegaly versus fat padding in the anterior neck, no carotid bruit, no lymphadenopathy Lungs-clear to auscultation bilaterally Cardiovascular-regular rate and rhythm; heart rate is likely in the high 90s but does not sound tachycardic at this time Abdomen-truncal obesity, soft and nontender Chest wall- patient with healed postsurgical scars on the left lateral lower chest wall and area is tender to palpation but no palpable rib/chest wall abnormality Back-no CVA tenderness Extremities-no edema       Assessment & Plan:  1. Myasthenia gravis [facility]) Patient with diagnosis of myasthenia gravis earlier this year in March.  Patient had hospitalization from 08/21/2017 to 08/24/2017 after the onset of double vision as well as an unsteady gait contributing to falls and drooping of her right eye.  Patient was found to have an anterior mediastinal mass consistent with a thymoma supporting the clinical concern for myasthenia gravis.  Patient was started on Mestinon and prednisone therapy.  This summer, patient had robotic assisted thymectomy on December 18, 2017.  During her work-up for myasthenia gravis, patient had a head CT showing a partially empty sella which may have been an anatomic variant but will refer patient to endocrinology for further evaluation.  Patient has  follow-up appointment tomorrow with neurology regarding her myasthenia gravis. - Ambulatory referral to Endocrinology  2. Long term (current) use of systemic steroids Patient with some instances of steroid-induced hyperglycemia and patient with long-term current use of systemic steroids.  Plan had been to obtain hemoglobin A1c however on review of her chart, patient did have this done on 03/15/2018 and hemoglobin A1c was within normal at 5.3.  Patient reports that she has made dietary changes including cutting out most complex carbohydrates and has lost weight.  Patient with random blood sugar of 95 at today's visit. Patient with a glucose of 128 on a chem-8 done on 03/23/18.  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 Per radiology this was thought to be an anatomic variant but with patient's fatigue and history of PCOS, will refer to endocrinology to make sure that there are  no other endocrine issues.  - Ambulatory referral to Endocrinology  4. Tachycardia Patient has has some issues with tachycardia and was apparently prescribed metoprolol at one point which she never filled as she does not like taking a lot of medications. Patient will have TSH and T4 done at a future lab visit to look for possible hyperthyroidism as a contributing cause. Patient did have a normal  TSH on 02/16/18. Patient also with CBC On 03/15/18 with no anemia, Hgb of 12.6 but mild increase in wbc at 12.8 which may be related to steroid use.  - T4 AND TSH; Future  *Influenza immunization was offered to the patient.  Patient states that she wants to check with her neurologist tomorrow to see what kind of flu vaccine she should have.  An After Visit Summary was printed and given to the patient.  Return in about 4 weeks (around 04/22/2018) for tachycardia.

## 2018-03-25 NOTE — Progress Notes (Signed)
Patient complained of a tight feeling in her chest when she is slumped over or bent over.  Patient was concerned about glucose levels due to her being on steroids.

## 2018-03-26 ENCOUNTER — Ambulatory Visit: Payer: Managed Care, Other (non HMO) | Admitting: Neurology

## 2018-03-26 ENCOUNTER — Encounter: Payer: Self-pay | Admitting: Neurology

## 2018-03-26 VITALS — BP 104/80 | HR 115 | Ht 66.0 in | Wt 200.1 lb

## 2018-03-26 DIAGNOSIS — G7001 Myasthenia gravis with (acute) exacerbation: Secondary | ICD-10-CM | POA: Diagnosis not present

## 2018-03-26 NOTE — Progress Notes (Signed)
Follow-up Visit   Date: 03/26/18    Suzanne Nguyen MRN: [id number] DOB: 06-Mar-1996   Interim History: Suzanne Nguyen is a 22 y.o. 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 the patient was accompanied to the clinic by self.  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  Over the next few days, she started getting tired easily and noticed difficulty with chewing, double vision, and droopiness of the right eye.  She was hospitalized at [facility] 3/19 - 08/24/2017 where MRI brain and CT A neck was unremarkable.  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  She was started on mestinon '30mg'$  TID and prednisone '10mg'$ .  She takes mestinon '30mg'$  at 7am, 3pm, and 10pm which has improved her ability to open her eyes.  She establish care with me in early April, at which time I started prednisone 30 mg which did not have marked benefit.  Due to her progressive weakness, IVIG was started in late April, which improved much of her bulbar weakness.  She was continued on monthly IVIG over the summer when she returned home and while under the care of Dr. Thomasene Lot at [facility].   She underwent robotic assisted thymectomy on 7/16 which required taking the left phrenic nerve.  Following surgery, she received IVIG, increased prednisone to '40mg'$  daily and then again admitted for plasmapheresis, which was completed in early August.  Her prednisone was increased to '60mg'$  daily and she was started on Cellcept '500mg'$  BID in September.   She was hospitalized from 9/13 - 02/25/2018 with myasthenia exacerbation due to progressive dysphagia and double vision.  She had 5 cycles of PLEX which  significantly helped control her weakness.  She has been continued on an extended out-patient course.   UPDATE 03/14/2018: She has been getting out-patient PLEX 1-2 times per week and has noticed improved swallowing, double vision, and chewing.  She has mild droopiness of the right eye in the morning and double vision with upgaze, otherwise is doing quite well.  She is taking mestinon '90mg'$  at 6am, 11a, 3pm, and 8pm.  She remains on prednisone '50mg'$  daily.    UPDATE 03/26/2018:  She is here for follow-up visit.  She is doing fairly well on prednisone '40mg'$  and weekly sessions of plasmapheresis.  She continues to have intermittent double vision, neck heaviness, and arm weakness, which is worse around 11am, right before her 11am mestinon. She is usually feeling well in the morning and late afternoon.  She is planning on going home to [place] for [place] and winter break and is concerned about how her plasmapheresis will be managed during this time.  She is not taking bactrim due to fatigue.    Medications:  Current Outpatient Medications on File Prior to Visit  Medication Sig Dispense Refill  . CALCIUM PO Take 600 mg by mouth 2 (two) times daily. With vitamin D3 800 units    . cholecalciferol (VITAMIN D) 1000 units tablet Take 1,000 Units by mouth daily.    Marland Kitchen ibuprofen (ADVIL,MOTRIN) 200 MG tablet Take 200 mg by mouth every 6 (six) hours as needed for mild pain.    . metoprolol succinate (TOPROL XL) 25 MG 24 hr tablet Take 0.5 tablets (12.5 mg total) by  mouth daily. 15 tablet 0  . Multiple Vitamin (MULTIVITAMIN) tablet Take 1 tablet by mouth daily.    . mycophenolate (CELLCEPT) 500 MG tablet Take 1 tablet (500 mg total) by mouth 2 (two) times daily. 60 tablet 0  . predniSONE (DELTASONE) 20 MG tablet Take 2 tablets (40 mg total) by mouth daily with breakfast. 90 tablet 2  . pyridostigmine (MESTINON) 60 MG tablet Take 1.5 tablet at 6am, 11am, 3pm, and 8pm. 240 tablet 0  . sulfamethoxazole-trimethoprim  (BACTRIM DS,SEPTRA DS) 800-160 MG tablet Take 1 tablet by mouth every Monday, Wednesday, and Friday. 30 tablet 1   No current facility-administered medications on file prior to visit.     Allergies:  Allergies  Allergen Reactions  . Magnesium-Containing Compounds Anaphylaxis    paralysis  . Corticosteroids     Worsen symptoms of MYASTHENIA GRAVES  . Other Other (See Comments)    Mycins  . Procainamide     Irregular Heart beat  . Soy Allergy Itching    Review of Systems:  CONSTITUTIONAL: No fevers, chills, night sweats, or weight loss.  EYES: +visual changes or eye pain ENT: No hearing changes.  No history of nose bleeds.   RESPIRATORY: No cough, wheezing and shortness of breath.   CARDIOVASCULAR: Negative for chest pain, and palpitations.   GI: No for abdominal discomfort, blood in stools or black stools.  No recent change in bowel habits.   GU:  No history of incontinence.   MUSCLOSKELETAL: No history of joint pain or swelling.  No myalgias.   SKIN: Negative for lesions, rash, and itching.   ENDOCRINE: Negative for cold or heat intolerance, polydipsia or goiter.   PSYCH:  No depression +anxiety symptoms.   NEURO: As Above.   Vital Signs:  BP 104/80   Pulse (!) 115   Ht '5\' 6"'$  (1.676 m)   Wt 200 lb 2 oz (90.8 kg)   SpO2 97%   BMI 32.30 kg/m    General Medical Exam:   General:  Well appearing, comfortable  Eyes/ENT: see cranial nerve examination.   Neck: No masses appreciated.  Full range of motion without tenderness. Respiratory:  Clear to auscultation, good air entry bilaterally.   Cardiac:  Tachycardic rate and rhythm, no murmur.   Ext:  No edema  Neurological Exam: MENTAL STATUS including orientation to time, place, person, recent and remote memory, attention span and concentration, language, and fund of knowledge is normal.  Speech is not dysarthric.  CRANIAL NERVES:  Pupils equal round and reactive to light.  There is trace bilateral ptosis, worse with  sustained up gaze.  Left eye continues to have weakness with medial gaze and upgaze.  Right eye movements are improved.  Facial muscles show trace weakness of the orbicularis oris, orbicularis oculi, and mild buccinator weakness.   Tongue is midline and strength is 5/5.  Tongue side-to-side movements are normal  MOTOR:  Motor strength is 5/5 in all extremities, including neck flexion. There is evidence of mild fatigability with proximal muscle testing.  COORDINATION/GAIT: Gait narrow based and stable.     Data: Labs 08/22/2017:  AChR binding 7.86*, blocking 73*, modulating 58*, TSH 0.98, SSA/B neg, ACE 62, ANA neg, HIV neg, CRP 3.3*, ESR 28*  Lab Results  Component Value Date   CREATININE 0.80 03/23/2018   BUN 8 03/23/2018   NA 139 03/23/2018   K 4.1 03/23/2018   CL 100 03/23/2018   CO2 28 03/15/2018   Lab Results  Component Value Date  WBC 12.8 (H) 03/15/2018   HGB 12.9 03/23/2018   HCT 38.0 03/23/2018   MCV 90.1 03/15/2018   PLT 211 03/15/2018    CT head and neck w contrast 08/22/2017:   1. Anterior mediastinal mass, most consistent with thymoma. This supports the clinical concern for myasthenia gravis. 2. No arterial abnormality of the head or neck. 3. No intracranial abnormality.  MRI brain wwo contrast 08/22/2017:   1. Negative brain MRI. No acute intracranial abnormality identified. 2. Partially empty sella.   IMPRESSION/PLAN: Seropositive generalized myasthenia gravis s/p thymectomy (July 2019) with exacerbation.  Diagnosed in March 2019 with 3 hospitalizations, no MG crisis requiring intubation.  Due to persistent bulbar weakness, her prednisone was increased to '60mg'$  daily and she was readmitted in September for plasmapheresis.  She completed 5 sessions of plasmapheresis while hospitalized and had resolution of dysarthria and improve dysphasia.  She has been getting weekly PLEX for the past 203 weeks and has not developed new weakness, but still not with optimal  strength. She continues to have mild bulbar and proximal weakness.  Continue medication regimen as noted below: Continue once weekly plasmapheresis (preferable Friday's) Continue prednisone to '40mg'$  daily Continue CellCept 500 mg twice daily.  Continue Mestinon 90 mg at 6 AM, 11 AM, 3 PM, and 8 PM.   OK to take 11am dose sooner around 10am.   Recommend immunization for influenza, avoid nasal flu immunization as well other live vaccines  Proceed with meningococcal vaccination in the event Solaris needs to be started, ideally I would like her off plasmapheresis  She will be returning home to [place] during Thanksgiving and winter break.  I have asked her to make a follow-up visit with Dr. Ronni Rumble office to see if they can help coordinate weekly out-patient PLEX while she is home.   Long term use of corticosteroids.  Continue calcium and vitamin D supplements Continue pepcid  Continue Bactrim DS Monday Wednesday Friday for PCP prophylaxis.     Return to clinic in 1 month  Greater than 50% of this 30 minute visit was spent in counseling, explanation of diagnosis, planning of further management, and coordination of care.   Thank you for allowing me to participate in patient's care.  If I can answer any additional questions, I would be pleased to do so.    Sincerely,    Nevelyn Mellott K. Posey Pronto, DO

## 2018-03-26 NOTE — Patient Instructions (Addendum)
Continue prednisone 40mg  daily Continue Cellcept 500mg  twice daily Continue mestinon 90mg  four times daily Continue weekly plasmapheresis Continue bactrim three times weekly  Please contact Dr. Ronni Rumble office for an appointment while you are visiting in November so you can be reestablished for plasmapheresis during the winter break  Return to clinic on November 20th at 9:30am.

## 2018-03-29 ENCOUNTER — Non-Acute Institutional Stay (HOSPITAL_COMMUNITY)
Admission: RE | Admit: 2018-03-29 | Discharge: 2018-03-29 | Disposition: A | Discharge status: Home / Self Care | Payer: Managed Care, Other (non HMO) | Source: Ambulatory Visit | Attending: Neurology | Admitting: Neurology

## 2018-03-29 MED ORDER — CALCIUM CARBONATE ANTACID 500 MG PO CHEW
CHEWABLE_TABLET | ORAL | Status: AC
  Filled 2018-03-29: qty 4

## 2018-03-29 MED ORDER — CALCIUM CARBONATE ANTACID 500 MG PO CHEW: 2.0000 | CHEWABLE_TABLET | ORAL | Status: DC

## 2018-03-29 MED ORDER — SODIUM CHLORIDE 0.9 % IV SOLN
2.0000 g | Freq: Once | INTRAVENOUS | Status: DC
  Filled 2018-03-29: qty 20

## 2018-03-29 MED ORDER — ACETAMINOPHEN 325 MG PO TABS: 650.0000 mg | ORAL_TABLET | ORAL | Status: DC | PRN

## 2018-03-29 MED ORDER — DIPHENHYDRAMINE HCL 25 MG PO CAPS: 25.0000 mg | ORAL_CAPSULE | Freq: Four times a day (QID) | ORAL | Status: DC | PRN

## 2018-03-29 MED ORDER — HEPARIN SODIUM (PORCINE) 1000 UNIT/ML IJ SOLN: 1000.0000 [IU] | Freq: Once | INTRAMUSCULAR | Status: DC

## 2018-03-29 MED ORDER — ACD FORMULA A 0.73-2.45-2.2 GM/100ML VI SOLN
Status: AC
  Filled 2018-03-29: qty 500

## 2018-03-29 MED ORDER — ACD FORMULA A 0.73-2.45-2.2 GM/100ML VI SOLN: 500.0000 mL | Status: DC

## 2018-03-29 MED ORDER — SODIUM CHLORIDE 0.9 % IV SOLN
INTRAVENOUS | Status: DC
  Filled 2018-03-29 (×3): qty 200

## 2018-03-29 NOTE — Progress Notes (Signed)
TPE discontinued today d/t machine issues;Dr. Rory Percy called and notified; pt will resumed tx on Monday 03/31/2018 and pt made aware.

## 2018-04-01 ENCOUNTER — Non-Acute Institutional Stay (HOSPITAL_COMMUNITY)
Admission: RE | Admit: 2018-04-01 | Discharge: 2018-04-01 | Disposition: A | Discharge status: Home / Self Care | Payer: Managed Care, Other (non HMO) | Source: Ambulatory Visit | Attending: Neurology | Admitting: Neurology

## 2018-04-01 DIAGNOSIS — G7001 Myasthenia gravis with (acute) exacerbation: Secondary | ICD-10-CM | POA: Diagnosis present

## 2018-04-01 LAB — POCT I-STAT, CHEM 8
BUN: 7 mg/dL (ref 6–20)
Calcium, Ion: 1.23 mmol/L (ref 1.15–1.40)
Chloride: 101 mmol/L (ref 98–111)
Creatinine, Ser: 0.7 mg/dL (ref 0.44–1.00)
Glucose, Bld: 163 mg/dL — ABNORMAL HIGH (ref 70–99)
HEMATOCRIT: 36 % (ref 36.0–46.0)
Hemoglobin: 12.2 g/dL (ref 12.0–15.0)
POTASSIUM: 4.3 mmol/L (ref 3.5–5.1)
Sodium: 139 mmol/L (ref 135–145)
TCO2: 29 mmol/L (ref 22–32)

## 2018-04-01 LAB — COMPREHENSIVE METABOLIC PANEL
ALT: 19 U/L (ref 0–44)
ANION GAP: 8 (ref 5–15)
AST: 25 U/L (ref 15–41)
Albumin: 4.1 g/dL (ref 3.5–5.0)
Alkaline Phosphatase: 41 U/L (ref 38–126)
BILIRUBIN TOTAL: 0.3 mg/dL (ref 0.3–1.2)
BUN: 6 mg/dL (ref 6–20)
CALCIUM: 9.7 mg/dL (ref 8.9–10.3)
CO2: 28 mmol/L (ref 22–32)
CREATININE: 0.62 mg/dL (ref 0.44–1.00)
Chloride: 104 mmol/L (ref 98–111)
Glucose, Bld: 170 mg/dL — ABNORMAL HIGH (ref 70–99)
Potassium: 4.3 mmol/L (ref 3.5–5.1)
Sodium: 140 mmol/L (ref 135–145)
TOTAL PROTEIN: 6.2 g/dL — AB (ref 6.5–8.1)

## 2018-04-01 LAB — CBC
HEMATOCRIT: 38.7 % (ref 36.0–46.0)
Hemoglobin: 12.1 g/dL (ref 12.0–15.0)
MCH: 27.7 pg (ref 26.0–34.0)
MCHC: 31.3 g/dL (ref 30.0–36.0)
MCV: 88.6 fL (ref 80.0–100.0)
PLATELETS: 279 10*3/uL (ref 150–400)
RBC: 4.37 MIL/uL (ref 3.87–5.11)
RDW: 13.1 % (ref 11.5–15.5)
WBC: 12.5 10*3/uL — AB (ref 4.0–10.5)
nRBC: 0 % (ref 0.0–0.2)

## 2018-04-01 MED ORDER — SODIUM CHLORIDE 0.9 % IV SOLN
2.0000 g | Freq: Once | INTRAVENOUS | Status: AC
  Administered 2018-04-01: 2 g via INTRAVENOUS
  Filled 2018-04-01: qty 20

## 2018-04-01 MED ORDER — ACD FORMULA A 0.73-2.45-2.2 GM/100ML VI SOLN
Status: AC
  Administered 2018-04-01: 500 mL via INTRAVENOUS
  Filled 2018-04-01: qty 500

## 2018-04-01 MED ORDER — HEPARIN SODIUM (PORCINE) 1000 UNIT/ML IJ SOLN: 1000.0000 [IU] | Freq: Once | INTRAMUSCULAR | Status: DC

## 2018-04-01 MED ORDER — ACD FORMULA A 0.73-2.45-2.2 GM/100ML VI SOLN
500.0000 mL | Status: DC
  Administered 2018-04-01: 500 mL via INTRAVENOUS

## 2018-04-01 MED ORDER — DIPHENHYDRAMINE HCL 25 MG PO CAPS: 25.0000 mg | ORAL_CAPSULE | Freq: Four times a day (QID) | ORAL | Status: DC | PRN

## 2018-04-01 MED ORDER — ACD FORMULA A 0.73-2.45-2.2 GM/100ML VI SOLN
Status: AC
  Filled 2018-04-01: qty 500

## 2018-04-01 MED ORDER — CALCIUM CARBONATE ANTACID 500 MG PO CHEW
2.0000 | CHEWABLE_TABLET | ORAL | Status: DC
  Administered 2018-04-01: 400 mg via ORAL

## 2018-04-01 MED ORDER — HEPARIN SODIUM (PORCINE) 1000 UNIT/ML IJ SOLN
INTRAMUSCULAR | Status: AC
  Filled 2018-04-01: qty 3

## 2018-04-01 MED ORDER — ACETAMINOPHEN 325 MG PO TABS: 650.0000 mg | ORAL_TABLET | ORAL | Status: DC | PRN

## 2018-04-01 MED ORDER — SODIUM CHLORIDE 0.9 % IV SOLN
INTRAVENOUS | Status: AC
  Administered 2018-04-01 (×3): via INTRAVENOUS_CENTRAL
  Filled 2018-04-01 (×3): qty 200

## 2018-04-01 MED ORDER — CALCIUM CARBONATE ANTACID 500 MG PO CHEW
CHEWABLE_TABLET | ORAL | Status: AC
  Administered 2018-04-01: 400 mg via ORAL
  Filled 2018-04-01: qty 4

## 2018-04-02 ENCOUNTER — Other Ambulatory Visit: Payer: Self-pay | Admitting: *Deleted

## 2018-04-02 ENCOUNTER — Encounter: Payer: Self-pay | Admitting: Family Medicine

## 2018-04-02 ENCOUNTER — Telehealth: Payer: Self-pay | Admitting: Neurology

## 2018-04-02 DIAGNOSIS — G7000 Myasthenia gravis without (acute) exacerbation: Secondary | ICD-10-CM

## 2018-04-02 NOTE — Telephone Encounter (Signed)
Patient requested for me to call Dane at the hemodialysis center.  I spoke with Dane and she instructed me to call short stay to order the catheter replacement.

## 2018-04-02 NOTE — Telephone Encounter (Signed)
Patient called needing orders for a replacement catheter. Please call her back at [phone]. Thanks!

## 2018-04-09 ENCOUNTER — Other Ambulatory Visit: Payer: Self-pay | Admitting: Physician Assistant

## 2018-04-10 ENCOUNTER — Encounter (HOSPITAL_COMMUNITY): Payer: Self-pay | Admitting: Interventional Radiology

## 2018-04-10 ENCOUNTER — Ambulatory Visit (HOSPITAL_COMMUNITY)
Admission: RE | Admit: 2018-04-10 | Discharge: 2018-04-10 | Disposition: A | Discharge status: Home / Self Care | Payer: Managed Care, Other (non HMO) | Source: Ambulatory Visit | Attending: Neurology | Admitting: Neurology

## 2018-04-10 ENCOUNTER — Other Ambulatory Visit: Payer: Self-pay | Admitting: Neurology

## 2018-04-10 DIAGNOSIS — G7000 Myasthenia gravis without (acute) exacerbation: Secondary | ICD-10-CM

## 2018-04-10 DIAGNOSIS — Z452 Encounter for adjustment and management of vascular access device: Secondary | ICD-10-CM | POA: Insufficient documentation

## 2018-04-10 HISTORY — PX: IR FLUORO GUIDE CV LINE RIGHT: IMG2283

## 2018-04-10 MED ORDER — CHLORHEXIDINE GLUCONATE 4 % EX LIQD
CUTANEOUS | Status: AC
  Filled 2018-04-10: qty 15

## 2018-04-10 MED ORDER — LIDOCAINE HCL 1 % IJ SOLN
INTRAMUSCULAR | Status: DC | PRN
  Administered 2018-04-10: 10 mL

## 2018-04-10 MED ORDER — CEFAZOLIN SODIUM-DEXTROSE 2-4 GM/100ML-% IV SOLN
INTRAVENOUS | Status: AC
  Administered 2018-04-10: 2000 mg
  Filled 2018-04-10: qty 100

## 2018-04-10 MED ORDER — LIDOCAINE HCL 1 % IJ SOLN
INTRAMUSCULAR | Status: AC
  Filled 2018-04-10: qty 20

## 2018-04-10 MED ORDER — HEPARIN SODIUM (PORCINE) 1000 UNIT/ML IJ SOLN
INTRAMUSCULAR | Status: AC
  Administered 2018-04-10: 3.2 mL
  Filled 2018-04-10: qty 1

## 2018-04-10 NOTE — Procedures (Signed)
RIJV HD cath exchange 19 cm SVC RA EBL 0 Comp 0

## 2018-04-12 ENCOUNTER — Non-Acute Institutional Stay (HOSPITAL_COMMUNITY)
Admission: RE | Admit: 2018-04-12 | Discharge: 2018-04-12 | Disposition: A | Discharge status: Home / Self Care | Payer: Managed Care, Other (non HMO) | Source: Ambulatory Visit | Attending: Neurology | Admitting: Neurology

## 2018-04-12 ENCOUNTER — Other Ambulatory Visit: Payer: Self-pay | Admitting: *Deleted

## 2018-04-12 DIAGNOSIS — M311 Thrombotic microangiopathy: Secondary | ICD-10-CM | POA: Diagnosis present

## 2018-04-12 DIAGNOSIS — G7001 Myasthenia gravis with (acute) exacerbation: Secondary | ICD-10-CM | POA: Diagnosis not present

## 2018-04-12 MED ORDER — ACD FORMULA A 0.73-2.45-2.2 GM/100ML VI SOLN
Status: AC
  Filled 2018-04-12: qty 500

## 2018-04-12 MED ORDER — MYCOPHENOLATE MOFETIL 500 MG PO TABS: 500.0000 mg | ORAL_TABLET | Freq: Two times a day (BID) | ORAL | 5 refills | Status: DC

## 2018-04-12 MED ORDER — CALCIUM CARBONATE ANTACID 500 MG PO CHEW
CHEWABLE_TABLET | ORAL | Status: AC
  Administered 2018-04-12: 400 mg via ORAL
  Filled 2018-04-12: qty 2

## 2018-04-12 MED ORDER — DIPHENHYDRAMINE HCL 25 MG PO CAPS: 25.0000 mg | ORAL_CAPSULE | Freq: Four times a day (QID) | ORAL | Status: DC | PRN

## 2018-04-12 MED ORDER — SODIUM CHLORIDE 0.9 % IV SOLN
INTRAVENOUS | Status: AC
  Administered 2018-04-12 (×3): via INTRAVENOUS_CENTRAL
  Filled 2018-04-12 (×3): qty 200

## 2018-04-12 MED ORDER — HEPARIN SODIUM (PORCINE) 1000 UNIT/ML IJ SOLN: 1000.0000 [IU] | Freq: Once | INTRAMUSCULAR | Status: DC

## 2018-04-12 MED ORDER — ACD FORMULA A 0.73-2.45-2.2 GM/100ML VI SOLN
500.0000 mL | Status: DC
  Administered 2018-04-12: 500 mL via INTRAVENOUS

## 2018-04-12 MED ORDER — HEPARIN SODIUM (PORCINE) 1000 UNIT/ML IJ SOLN
INTRAMUSCULAR | Status: AC
  Administered 2018-04-12: 17:00:00
  Filled 2018-04-12: qty 4

## 2018-04-12 MED ORDER — ACETAMINOPHEN 325 MG PO TABS: 650.0000 mg | ORAL_TABLET | ORAL | Status: DC | PRN

## 2018-04-12 MED ORDER — SODIUM CHLORIDE 0.9 % IV SOLN
2.0000 g | Freq: Once | INTRAVENOUS | Status: AC
  Administered 2018-04-12: 2 g via INTRAVENOUS
  Filled 2018-04-12: qty 20

## 2018-04-12 MED ORDER — CALCIUM CARBONATE ANTACID 500 MG PO CHEW
2.0000 | CHEWABLE_TABLET | ORAL | Status: DC
  Administered 2018-04-12: 400 mg via ORAL

## 2018-04-16 NOTE — Progress Notes (Signed)
Name: Suzanne Nguyen  MRN/ DOB: [id number], 1995-06-20    Age/ Sex: 22 y.o., female    PCP: Antony Blackbird, MD   Reason for Endocrinology Evaluation: Partial empty sella      Date of Initial Endocrinology Evaluation: 04/17/2018     HPI: Ms. Suzanne Nguyen is a 22 y.o. female with a past medical history of Myasthenia Gravis and PCOS. The patient presented for initial endocrinology clinic visit on 04/17/2018 for consultative assistance with her partial empty sella.   Patient was diagnosed with Myasthenia gravis in 08/2017  Due to presentation with right eye ptosis and diplopia. She has been on corticosteroids since then, she was on 60 mg at some point. Her team tried to reduce the dose to 40 mg but she developed symptoms and currently on 50 mg of prednisone. She is s/p thymectomy in 12/2017.  She was admitted in September, 2019 for MG crises, requiring plasmaphoresis.   She was diagnosed with PCOS during highschool. She was on OCP but one day she had sudden vaginal bleed and stopped.  Currently she has regular menses. LMP was last week.   She has been losing weight (intenstional). She has migraine headaches and these are not any worse.  She has noted new stretch marks on her abdomen but has the occasional depression but denied any dizziness, nausea or vomiting.  She denies heat/cold intolerance.   No FH of endocrine disorder.   HISTORY:  Past Medical History:  Past Medical History:  Diagnosis Date  . Bronchitis   . Myasthenia gravis associated with thymoma ([place])   . PCOS (polycystic ovarian syndrome)   . Pneumonia    Past Surgical History:  Past Surgical History:  Procedure Laterality Date  . IR FLUORO GUIDE CV LINE RIGHT  02/17/2018  . IR FLUORO GUIDE CV LINE RIGHT  04/10/2018  . IR US GUIDE VASC ACCESS RIGHT  02/17/2018  . None        Social History:  reports that she has never smoked. She has never used smokeless tobacco. She reports that she does not drink alcohol  or use drugs.  Family History: family history includes Cancer in her maternal grandmother; Diabetes in her father, maternal grandfather, and paternal grandmother; Gastric cancer in her mother; Heart disease in her father, maternal grandfather, and maternal grandmother; Hypertension in her father and mother; Lung cancer in her maternal grandfather; Stroke in her maternal grandfather.   HOME MEDICATIONS: Current Outpatient Medications on File Prior to Visit  Medication Sig Dispense Refill  . CALCIUM PO Take 600 mg by mouth 2 (two) times daily. With vitamin D3 800 units    . cholecalciferol (VITAMIN D) 1000 units tablet Take 1,000 Units by mouth daily.    Marland Kitchen ibuprofen (ADVIL,MOTRIN) 200 MG tablet Take 200 mg by mouth every 6 (six) hours as needed for mild pain.    . Multiple Vitamin (MULTIVITAMIN) tablet Take 1 tablet by mouth daily.    . mycophenolate (CELLCEPT) 500 MG tablet Take 1 tablet (500 mg total) by mouth 2 (two) times daily. 60 tablet 5  . predniSONE (DELTASONE) 20 MG tablet Take 2 tablets (40 mg total) by mouth daily with breakfast. 90 tablet 2  . pyridostigmine (MESTINON) 60 MG tablet Take 1.5 tablet at 6am, 11am, 3pm, and 8pm. 240 tablet 0  . sulfamethoxazole-trimethoprim (BACTRIM DS,SEPTRA DS) 800-160 MG tablet Take 1 tablet by mouth every Monday, Wednesday, and Friday. 30 tablet 1   No current facility-administered medications on file prior to visit.  REVIEW OF SYSTEMS: A comprehensive ROS was conducted with the patient and is negative except as per HPI and below:  Review of Systems  Constitutional: Positive for weight loss. Negative for malaise/fatigue.  HENT: Negative for congestion and sore throat.   Eyes: Positive for blurred vision. Negative for pain.  Respiratory: Positive for cough. Negative for shortness of breath.   Cardiovascular: Negative for chest pain and palpitations.  Gastrointestinal: Negative for constipation, diarrhea and nausea.  Genitourinary:  Positive for frequency.  Skin: Negative for itching and rash.  Neurological: Positive for tingling and tremors.       In hands  Endo/Heme/Allergies: Negative for polydipsia.  Psychiatric/Behavioral: Positive for depression. The patient is nervous/anxious.        OBJECTIVE:  VS: BP 136/82   Pulse (!) 111   Resp 16   Ht 5\' 6"  (1.676 m)   Wt 198 lb (89.8 kg)   SpO2 98%   BMI 31.96 kg/m    Wt Readings from Last 3 Encounters:  04/17/18 198 lb (89.8 kg)  03/26/18 200 lb 2 oz (90.8 kg)  03/25/18 199 lb 6.4 oz (90.4 kg)     EXAM: General: Pt appears well and is in NAD  Hydration: Well-hydrated with moist mucous membranes and good skin turgor  Eyes: External eye exam normal without stare, lid lag or exophthalmos.  EOM intact.  PERRL.  Ears, Nose, Throat: Hearing: Grossly intact bilaterally Dental: Good dentition  Throat: Clear without mass, erythema or exudate  Neck: General: Supple without adenopathy. Thyroid: Thyroid size normal.  No goiter or nodules appreciated. No thyroid bruit.  Lungs: Clear with good BS bilat with no rales, rhonchi, or wheezes  Heart: Auscultation: RRR.  Abdomen: Normoactive bowel sounds, soft, nontender, without masses or organomegaly palpable  Extremities:  BL LE: No pretibial edema normal ROM and strength.  Skin: Hair: Texture and amount normal with gender appropriate distribution Skin Inspection: No rashes, acanthosis nigricans/skin tags. Skin Palpation: Skin temperature, texture, and thickness normal to palpation  Neuro: Cranial nerves: II - XII grossly intact  Motor: Normal strength throughout DTRs: 2+ and symmetric in UE without delay in relaxation phase  Mental Status: Judgment, insight: Intact Orientation: Oriented to time, place, and person Mood and affect: No depression, anxiety, or agitation     DATA REVIEWED:  MRI of brain 08/22/17 Incidental note made of a partially empty sella. Suprasellar region normal. Midline structures  intact.    Results for DEVINA, BEZOLD (MRN [id number]) as of 04/18/2018 12:15  Ref. Range 04/17/2018 13:44  TSH Latest Ref Range: 0.35 - 4.50 uIU/mL 0.20 (L)  T4,Free(Direct) Latest Ref Range: 0.60 - 1.60 ng/dL 0.79    ASSESSMENT/PLAN/RECOMMENDATIONS:   1. Partial empty sella [place]:  - There's no clinical evidence of hypopituitarism or hyperpituitarism prior to her MG diagnosis or treatment.  - Most cases of partial empty sella, have normal pituitary function.  - Her hypothalamic-pituitary-gonadal axis is intact given regular menses.  - We are unable to assess hypothalamic-pituitary-adrenal axis at this time as she is on prednisone and has been on it for quite a few months.   - No evidence of adrenal insufficiency prior to prednisone therapy.  - I will be happy to see the patient in the future once she is off prednisone, if there's any clinical suspicion of adrenal insufficiency.   2. Abnormal TSH :  - Patient is clinically euthyroid  - Her low TSH is most likely due to glucocorticoid use, as they will decrease the secretion of  TSH. Her FT4 is normal which is reassuring.  - I would not check her TSH unless she is on less then 20 mg of prednisone a day (or equivalent), I would recommend basing any further treatment solely Free T4 at this time.    Please don't hesitate to contact us with any questions if needed.   F/U PRN     Signed electronically by: Mack Guise, MD  [facility] End[facility]in[facility]  [address] [place], [place] [address] Phone: [phone] FAX: [phone]   CC: Antony Blackbird, MD [place] [address] Phone: [phone] Fax: [phone]   Return to Endocrinology clinic as below: Future Appointments  Date Time Provider [place]  04/22/2018  3:10 PM Antony Blackbird, MD [facility] None  04/24/2018  9:30 AM Narda Amber K, DO [facility] None

## 2018-04-17 ENCOUNTER — Encounter: Payer: Self-pay | Admitting: Internal Medicine

## 2018-04-17 ENCOUNTER — Ambulatory Visit: Payer: Managed Care, Other (non HMO) | Admitting: Internal Medicine

## 2018-04-17 VITALS — BP 136/82 | HR 111 | Resp 16 | Ht 66.0 in | Wt 198.0 lb

## 2018-04-17 DIAGNOSIS — E236 Other disorders of pituitary gland: Secondary | ICD-10-CM

## 2018-04-17 LAB — T4, FREE: FREE T4: 0.79 ng/dL (ref 0.60–1.60)

## 2018-04-17 LAB — TSH: TSH: 0.2 u[IU]/mL — AB (ref 0.35–4.50)

## 2018-04-17 NOTE — Patient Instructions (Signed)
-   You are on high dose of steroids at this time, so we are unable to check your body's ability to produce its own cortisol level.  - We will check a full thyroid panel and let you know about the results.  - I have no concerns about female hormones in your case as long as you keep getting periods regularly.

## 2018-04-18 ENCOUNTER — Telehealth: Payer: Self-pay | Admitting: Internal Medicine

## 2018-04-18 NOTE — Telephone Encounter (Signed)
Spoke to Suzanne Nguyen   Discussed lab results as below     Results for [name], [name] (MRN [id number]) as of 04/18/2018 12:27  Ref. Range 04/17/2018 13:44  TSH Latest Ref Range: 0.35 - 4.50 uIU/mL 0.20 (L)  T4,Free(Direct) Latest Ref Range: 0.60 - 1.60 ng/dL 0.79      - I explained to the patient that her low TSH is due to prednisone. Prednisone will decrease TSH secretions.  - Reassurance provided for normal FT4 and no further treatment is needed at this time.    Pt expressed understanding of the above.    Melanie Crazier Chandelle Harkey

## 2018-04-18 NOTE — Telephone Encounter (Signed)
Patient is returning call. Please Advise, thanks

## 2018-04-19 ENCOUNTER — Ambulatory Visit (HOSPITAL_COMMUNITY)
Admission: RE | Admit: 2018-04-19 | Discharge: 2018-04-19 | Disposition: A | Discharge status: Home / Self Care | Payer: Managed Care, Other (non HMO) | Source: Ambulatory Visit | Attending: Neurology | Admitting: Neurology

## 2018-04-19 DIAGNOSIS — G7000 Myasthenia gravis without (acute) exacerbation: Secondary | ICD-10-CM | POA: Insufficient documentation

## 2018-04-19 LAB — POCT I-STAT, CHEM 8
BUN: 10 mg/dL (ref 6–20)
Calcium, Ion: 1 mmol/L — ABNORMAL LOW (ref 1.15–1.40)
Chloride: 105 mmol/L (ref 98–111)
Creatinine, Ser: 0.7 mg/dL (ref 0.44–1.00)
Glucose, Bld: 111 mg/dL — ABNORMAL HIGH (ref 70–99)
HEMATOCRIT: 33 % — AB (ref 36.0–46.0)
HEMOGLOBIN: 11.2 g/dL — AB (ref 12.0–15.0)
POTASSIUM: 3.8 mmol/L (ref 3.5–5.1)
SODIUM: 142 mmol/L (ref 135–145)
TCO2: 25 mmol/L (ref 22–32)

## 2018-04-19 MED ORDER — SODIUM CHLORIDE 0.9 % IV SOLN
3.0000 g | Freq: Once | INTRAVENOUS | Status: DC
  Filled 2018-04-19: qty 30

## 2018-04-19 MED ORDER — CALCIUM CARBONATE ANTACID 500 MG PO CHEW
2.0000 | CHEWABLE_TABLET | ORAL | Status: DC
  Administered 2018-04-19: 400 mg via ORAL

## 2018-04-19 MED ORDER — DIPHENHYDRAMINE HCL 25 MG PO CAPS: 25.0000 mg | ORAL_CAPSULE | Freq: Four times a day (QID) | ORAL | Status: DC | PRN

## 2018-04-19 MED ORDER — HEPARIN SODIUM (PORCINE) 1000 UNIT/ML IJ SOLN: 1000.0000 [IU] | Freq: Once | INTRAMUSCULAR | Status: DC

## 2018-04-19 MED ORDER — ACD FORMULA A 0.73-2.45-2.2 GM/100ML VI SOLN
Status: AC
  Filled 2018-04-19: qty 500

## 2018-04-19 MED ORDER — ACD FORMULA A 0.73-2.45-2.2 GM/100ML VI SOLN: 500.0000 mL | Status: DC

## 2018-04-19 MED ORDER — CALCIUM CARBONATE ANTACID 500 MG PO CHEW
CHEWABLE_TABLET | ORAL | Status: AC
  Filled 2018-04-19: qty 1

## 2018-04-19 MED ORDER — ACETAMINOPHEN 325 MG PO TABS: 650.0000 mg | ORAL_TABLET | ORAL | Status: DC | PRN

## 2018-04-19 MED ORDER — SODIUM CHLORIDE 0.9 % IV SOLN
INTRAVENOUS | Status: AC
  Administered 2018-04-19 (×3): via INTRAVENOUS_CENTRAL
  Filled 2018-04-19 (×3): qty 200

## 2018-04-19 MED ORDER — CALCIUM CARBONATE ANTACID 500 MG PO CHEW
CHEWABLE_TABLET | ORAL | Status: AC
  Administered 2018-04-19: 400 mg via ORAL
  Filled 2018-04-19: qty 2

## 2018-04-22 ENCOUNTER — Ambulatory Visit: Payer: Managed Care, Other (non HMO) | Attending: Family Medicine | Admitting: Family Medicine

## 2018-04-22 ENCOUNTER — Encounter: Payer: Self-pay | Admitting: Family Medicine

## 2018-04-22 VITALS — BP 138/86 | HR 107 | Temp 98.9°F | Resp 18 | Ht 66.0 in | Wt 197.0 lb

## 2018-04-22 DIAGNOSIS — Z833 Family history of diabetes mellitus: Secondary | ICD-10-CM | POA: Insufficient documentation

## 2018-04-22 DIAGNOSIS — Z888 Allergy status to other drugs, medicaments and biological substances status: Secondary | ICD-10-CM | POA: Insufficient documentation

## 2018-04-22 DIAGNOSIS — R946 Abnormal results of thyroid function studies: Secondary | ICD-10-CM | POA: Insufficient documentation

## 2018-04-22 DIAGNOSIS — Z881 Allergy status to other antibiotic agents status: Secondary | ICD-10-CM | POA: Insufficient documentation

## 2018-04-22 DIAGNOSIS — Z91018 Allergy to other foods: Secondary | ICD-10-CM | POA: Insufficient documentation

## 2018-04-22 DIAGNOSIS — Z7952 Long term (current) use of systemic steroids: Secondary | ICD-10-CM | POA: Diagnosis not present

## 2018-04-22 DIAGNOSIS — Z8249 Family history of ischemic heart disease and other diseases of the circulatory system: Secondary | ICD-10-CM | POA: Insufficient documentation

## 2018-04-22 DIAGNOSIS — R000 Tachycardia, unspecified: Secondary | ICD-10-CM | POA: Diagnosis not present

## 2018-04-22 DIAGNOSIS — G7000 Myasthenia gravis without (acute) exacerbation: Secondary | ICD-10-CM | POA: Diagnosis not present

## 2018-04-22 DIAGNOSIS — E010 Iodine-deficiency related diffuse (endemic) goiter: Secondary | ICD-10-CM | POA: Diagnosis not present

## 2018-04-22 MED ORDER — PREDNISONE 20 MG PO TABS: 40.0000 mg | ORAL_TABLET | Freq: Every day | ORAL | 4 refills | Status: DC

## 2018-04-22 NOTE — Progress Notes (Signed)
Subjective:    Patient ID: Suzanne Nguyen, female    DOB: [birth year], 22 y.o.   MRN: [id number]  HPI 22 year old female who was seen in follow-up of myasthenia gravis with long-term use of prednisone therapy, abnormal thyroid labs and patient is status post recent visit with endocrinology regarding thyroid labs, PCOS and partially empty sella turcica.  Patient reports that over the past few weeks she has actually felt very well.  Patient states that she has not had any significant fatigue or muscle weakness.  Patient denies any shortness of breath or cough.  Patient denies any difficulty swallowing.  Patient has had some mild discomfort in her hands when she has been typing for a while but she states that the hand discomfort stops as soon as she stops typing.  Patient reports that she continues to receive plasmapheresis and continues to see neurology.  Patient has had no episodes of blurred vision or double vision.  Patient does not feel as if she has had any worsening of facial weakness.      Patient continues to take prednisone 40 mg daily.  Patient does need a refill of the medication at today's visit.  Patient denies any increased thirst, no urinary frequency and no blurred vision related to her use of prednisone.  Patient continues to watch her diet to avoid weight gain associated with long-term use of prednisone.  Patient has noticed a non-itchy rash/skin changes on her anterior chest.  Patient states that she was told in the past that she might have psoriasis.   Past Medical History:  Diagnosis Date  . Bronchitis   . Myasthenia gravis associated with thymoma ([place])   . PCOS (polycystic ovarian syndrome)   . Pneumonia    Past Surgical History:  Procedure Laterality Date  . IR FLUORO GUIDE CV LINE RIGHT  02/17/2018  . IR FLUORO GUIDE CV LINE RIGHT  04/10/2018  . IR US GUIDE VASC ACCESS RIGHT  02/17/2018  . None    . THYMECTOMY  12/2017   Family History  Problem Relation Age of Onset    . Gastric cancer Mother   . Hypertension Mother   . Diabetes Father   . Hypertension Father   . Heart disease Father   . Cancer Maternal Grandmother   . Heart disease Maternal Grandmother   . Sarcoidosis Maternal Grandmother   . Lung cancer Maternal Grandfather   . Heart disease Maternal Grandfather   . Stroke Maternal Grandfather   . Diabetes Maternal Grandfather   . Diabetes Paternal Grandmother    Social History   Tobacco Use  . Smoking status: Never Smoker  . Smokeless tobacco: Never Used  Substance Use Topics  . Alcohol use: No  . Drug use: No   Allergies  Allergen Reactions  . Magnesium-Containing Compounds Anaphylaxis    paralysis  . Corticosteroids     Worsen symptoms of MYASTHENIA GRAVES  . Other Other (See Comments)    Mycins  . Procainamide     Irregular Heart beat  . Soy Allergy Itching      Review of Systems  Constitutional: Negative for chills, diaphoresis, fatigue and fever.  HENT: Negative for trouble swallowing and voice change.   Respiratory: Negative for cough and shortness of breath.   Cardiovascular: Negative for chest pain, palpitations and leg swelling.  Gastrointestinal: Negative for abdominal pain, constipation, diarrhea and nausea.  Endocrine: Negative for polydipsia, polyphagia and polyuria.  Genitourinary: Negative for dysuria and frequency.  Musculoskeletal: Positive for arthralgias. Negative  for back pain, gait problem, joint swelling and myalgias.  Neurological: Negative for dizziness, weakness and headaches.  Hematological: Negative for adenopathy. Does not bruise/bleed easily.       Objective:   Physical Exam BP 138/86 (BP Location: Left Arm, Patient Position: Sitting, Cuff Size: Large)   Pulse (!) 107   Temp 98.9 F (37.2 C) (Oral)   Resp 18   Ht 5\' 6"  (1.676 m)   Wt 197 lb (89.4 kg)   LMP 04/06/2018   SpO2 97%   BMI 31.80 kg/m   General-well-nourished, well-developed obese young adult female in no acute  distress ENT-TMs dull, nares with mild edema of the nasal turbinates, normal oropharynx with slightly narrow posterior airway due to body habitus Neck-supple, patient with borderline thyromegaly, no carotid bruit Cardiovascular- regular rhythm, patient with tachycardia Lungs-clear to auscultation bilaterally Abdomen-soft, nontender Back-no CVA tenderness Skin- patient with slightly raised brown plaques on the upper chest Extremities-no edema Neuro-cranial nerves II through XII are grossly intact and patient with 5/5 extremity strength       Assessment & Plan:  1. Myasthenia gravis [facility]) Patient with history of myasthenia gravis status post thymectomy.  Patient is currently receiving plasmapheresis and patient is following up with neurology, patient's most recent neurology office visit note was reviewed at today's visit.  Patient is provided with refill of prednisone 40 mg daily.  Patient reports that she continues to feel well.  Patient's myasthenia appears to be stable on her current treatment regimen. - predniSONE (DELTASONE) 20 MG tablet; Take 2 tablets (40 mg total) by mouth daily with breakfast.  Dispense: 60 tablet; Refill: 4  2. Tachycardia Patient has had prolonged issues with tachycardia and during her hospitalization the summer, prescription was provided for metoprolol but patient states that she is trying not to take additional medications.  Patient also has borderline thyromegaly and has had recent abnormal TSH.  Patient on review of endocrinology note was thought to have abnormal TSH based on long-term corticosteroid use and patient with normal T4.  Patient will have thyroid ultrasound to look for nodules or signs of thyroiditis which may be contributing to her tachycardia - US THYROID; Future  3. Long term (current) use of systemic steroids Patient with long-term use of systemic steroids.  Patient has been trying to watch her diet and engaging in exercise to avoid weight gain  and elevated blood sugar associated with use of prednisone.  Patient will have repeat hemoglobin A1c at her next visit.  4. Thyromegaly Patient with borderline thyromegaly and patient with recent abnormal TSH.  Patient will be scheduled for thyroid ultrasound. - US THYROID; Future  An After Visit Summary was printed and given to the patient.  Return in about 3 months (around 07/23/2018) for f/u prednisone use.

## 2018-04-24 ENCOUNTER — Encounter: Payer: Self-pay | Admitting: Neurology

## 2018-04-24 ENCOUNTER — Ambulatory Visit: Payer: Managed Care, Other (non HMO) | Admitting: Neurology

## 2018-04-24 VITALS — BP 120/80 | HR 103 | Ht 66.0 in | Wt 199.1 lb

## 2018-04-24 DIAGNOSIS — G7001 Myasthenia gravis with (acute) exacerbation: Secondary | ICD-10-CM

## 2018-04-24 DIAGNOSIS — Z7952 Long term (current) use of systemic steroids: Secondary | ICD-10-CM | POA: Diagnosis not present

## 2018-04-24 DIAGNOSIS — Z9089 Acquired absence of other organs: Secondary | ICD-10-CM

## 2018-04-24 MED ORDER — PREDNISONE 20 MG PO TABS: 50.0000 mg | ORAL_TABLET | Freq: Every day | ORAL | 3 refills | Status: DC

## 2018-04-24 MED ORDER — SULFAMETHOXAZOLE-TRIMETHOPRIM 800-160 MG PO TABS: 1.0000 | ORAL_TABLET | ORAL | 6 refills | Status: DC

## 2018-04-24 NOTE — Patient Instructions (Addendum)
We will schedule plasmapheresis treatments on 11/22, 12/2, and 12/13.  Continue medications as you are taking  Return to clinic on December 12th at 3pm

## 2018-04-24 NOTE — Progress Notes (Addendum)
Follow-up Visit   Date: 04/24/18    Suzanne Nguyen MRN: [id number] DOB: 05-13-1996   Interim History: Suzanne Nguyen is a 22 y.o. right-handed African American female with PCOS with refractory seropositive myasthenia gravis s/p thymectomy returning to the clinic for follow-up of myasthenia gravis.  The patient was accompanied to the clinic by self.  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  Over the next few days, she started getting tired easily and noticed difficulty with chewing, double vision, and droopiness of the right eye.  She was hospitalized at [facility] 3/19 - 08/24/2017 where MRI brain and CT A neck was unremarkable.  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  She was started on mestinon '30mg'$  TID and prednisone '10mg'$ .  She takes mestinon '30mg'$  at 7am, 3pm, and 10pm which has improved her ability to open her eyes.  She establish care with me in early April, at which time I started prednisone 30 mg which did not have marked benefit.  Due to her progressive weakness, IVIG was started in late April, which improved much of her bulbar weakness.  She was continued on monthly IVIG over the summer when she returned home and while under the care of Dr. Thomasene Lot at [facility].   She underwent robotic assisted thymectomy on 12/18/2017 which required taking the left phrenic nerve.  Following surgery, she received IVIG, increased prednisone to '40mg'$  daily and then again admitted for plasmapheresis, which was completed in early August.  Her prednisone was increased to '60mg'$  daily and she was started on Cellcept '500mg'$  BID in September.   She was hospitalized from 9/13 - 02/25/2018 with myasthenia exacerbation due to progressive dysphagia and double vision.  She had 5  cycles of PLEX which significantly helped control her weakness.  She has been continued on an extended out-patient course of twice per week for 3 weeks, then continued with weekly sessions.    UPDATE 03/26/2018:  She is doing fairly well on prednisone '40mg'$  and weekly sessions of plasmapheresis.  She continues to have intermittent double vision, neck heaviness, and arm weakness, which is worse around 11am, right before her 11am mestinon. She is usually feeling well in the morning and late afternoon.    UPDATE 04/24/2018:  She is here for follow-up visit.  Soon after her last visit, she complained of increased weakness and therefore her prednisone was increased to '50mg'$ /d, which has helped.  She remains on weekly PLEX, prednisone '50mg'$ /d, mestinon '90mg'$  QID, and Cellcept '500mg'$  BID.  Overall, she feels much stronger than before, but still does not have a symmetric smile and gets tired with exertion.  Last week, she had a presentation and after talking for some time, felt that her voice was weaker and had jaw fatigue.  For the past week, she has no double vision or problems swallowing.  Her next PLEX is scheduled for 11/22 and we will arrange for her to have another PLEX when she returns from her Thanksgiving break on 12/2.     Medications:  Current Outpatient Medications on File Prior to Visit  Medication Sig Dispense Refill  . CALCIUM PO Take 600 mg by mouth 2 (two) times daily. With vitamin D3 800 units    . cholecalciferol (VITAMIN D) 1000 units tablet Take 1,000 Units by mouth daily.    Marland Kitchen  ibuprofen (ADVIL,MOTRIN) 200 MG tablet Take 200 mg by mouth every 6 (six) hours as needed for mild pain.    . Multiple Vitamin (MULTIVITAMIN) tablet Take 1 tablet by mouth daily.    . mycophenolate (CELLCEPT) 500 MG tablet Take 1 tablet (500 mg total) by mouth 2 (two) times daily. 60 tablet 5  . pyridostigmine (MESTINON) 60 MG tablet Take 1.5 tablet at 6am, 11am, 3pm, and 8pm. 240 tablet 0   No current  facility-administered medications on file prior to visit.     Allergies:  Allergies  Allergen Reactions  . Magnesium-Containing Compounds Anaphylaxis    paralysis  . Corticosteroids     Worsen symptoms of MYASTHENIA GRAVES  . Other Other (See Comments)    Mycins  . Procainamide     Irregular Heart beat  . Soy Allergy Itching    Review of Systems:  CONSTITUTIONAL: No fevers, chills, night sweats, or weight loss.  EYES: +visual changes or eye pain ENT: No hearing changes.  No history of nose bleeds.   RESPIRATORY: No cough, wheezing and shortness of breath.   CARDIOVASCULAR: Negative for chest pain, and palpitations.   GI: No for abdominal discomfort, blood in stools or black stools.  No recent change in bowel habits.   GU:  No history of incontinence.   MUSCLOSKELETAL: No history of joint pain or swelling.  No myalgias.   SKIN: Negative for lesions, rash, and itching.   ENDOCRINE: Negative for cold or heat intolerance, polydipsia or goiter.   PSYCH:  No depression +anxiety symptoms.   NEURO: As Above.   Vital Signs:  BP 120/80   Pulse (!) 103   Ht '5\' 6"'$  (1.676 m)   Wt 199 lb 2 oz (90.3 kg)   LMP 04/06/2018   SpO2 98%   BMI 32.14 kg/m   General Medical Exam:   General:  Well appearing, comfortable  Eyes/ENT: see cranial nerve examination.   Neck: No masses appreciated.  Full range of motion without tenderness.  No carotid bruits. Respiratory:  Clear to auscultation, good air entry bilaterally.   Cardiac:  Regular rate and rhythm, no murmur.   Ext:  No edema  Neurological Exam: MENTAL STATUS including orientation to time, place, person, recent and remote memory, attention span and concentration, language, and fund of knowledge is normal.  Speech is not dysarthric.  CRANIAL NERVES:  Pupils equal round and reactive to light.  There is mild left ptosis, worse with sustained up gaze.  Extraocular muscles are conjugate and intact bilaterally (marked improvement).  Facial  muscles show mild weakness of the orbicularis oris, orbicularis oculi, and buccinator weakness, overall better than before.   Tongue is midline and strength is 5/5.  Tongue side-to-side movements are normal.  MOTOR:  Motor strength is 5/5 in all extremities, including neck flexion. There is evidence of mild fatigability with neck flexion, limb strength does not fatigue.  COORDINATION/GAIT: Gait narrow based and stable.  She is easily able to stand up from a low chair without using arms to push off.   Data: Labs 08/22/2017:  AChR binding 7.86*, blocking 73*, modulating 58*, TSH 0.98, SSA/B neg, ACE 62, ANA neg, HIV neg, CRP 3.3*, ESR 28*  Lab Results  Component Value Date   CREATININE 0.70 04/19/2018   BUN 10 04/19/2018   NA 142 04/19/2018   K 3.8 04/19/2018   CL 105 04/19/2018   CO2 28 04/01/2018   Lab Results  Component Value Date   WBC 12.5 (H) 04/01/2018  HGB 11.2 (L) 04/19/2018   HCT 33.0 (L) 04/19/2018   MCV 88.6 04/01/2018   PLT 279 04/01/2018    CT head and neck w contrast 08/22/2017:   1. Anterior mediastinal mass, most consistent with thymoma. This supports the clinical concern for myasthenia gravis. 2. No arterial abnormality of the head or neck. 3. No intracranial abnormality.  MRI brain wwo contrast 08/22/2017:   1. Negative brain MRI. No acute intracranial abnormality identified. 2. Partially empty sella.   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  Diagnosed in March 2019 with 3 hospitalizations, no MG crisis requiring intubation.  Due to persistent bulbar weakness, her prednisone was increased to '60mg'$  daily and she was readmitted in September for plasmapheresis.  She completed 5 sessions of plasmapheresis while hospitalized and had resolution of dysarthria and improve dysphasia.  She has been getting weekly PLEX since this time and we have been able to avoid readmission for MG  exacerbation. Unfortunately, she continues to have mild ptosis, transverse smile, and weakness of facial muscles. Limb strength is intact.   She has been doing well on once weekly plasmapheresis and with the holidays coming up, she will be returning home during the week of Thanksgiving and from 12/14 - 06/17/2018.  I will plan to adjust PLEX around this time and will schedule treatments on 11/22, 12/2, and 12/13.  I will coordinate with Dr. Ronni Rumble office to see if they can arrange for out-patient PLEX while she is home in [place].  Continue regimen as follows:  1.  Plasmapheresis every 7-10 days  2.  Continue prednisone to '50mg'$ . I will attempt slow and cautious taper when she returns from [place] in January.   3.  Continue CellCept 500 mg twice daily.    4.  Continue Mestinon 90 mg at 6 AM, 10 AM, 3 PM, and 8 PM.    We discussed adding Solaris to her regimen following PLEX in attempt to help taper prednisone and PLEX.  She does not want to consider making any changes at this time.  I recommended getting meningococcal vaccination to avoid delay treatment in the event we decided to proceed with Solaris, which she will think about.   Long term use of corticosteroids.  Continue calcium and vitamin D supplements Continue pepcid  Continue Bactrim DS Monday Wednesday Friday for PCP prophylaxis.     Return to clinic in 1 month  Greater than 50% of this 40 minute visit was spent in counseling, explanation of diagnosis, planning of further management, and coordination of care.    Thank you for allowing me to participate in patient's care.  If I can answer any additional questions, I would be pleased to do so.    Sincerely,     K. Posey Pronto, DO

## 2018-04-25 ENCOUNTER — Ambulatory Visit (HOSPITAL_COMMUNITY): Payer: Managed Care, Other (non HMO) | Attending: Family Medicine

## 2018-04-26 ENCOUNTER — Non-Acute Institutional Stay (HOSPITAL_COMMUNITY)
Admission: RE | Admit: 2018-04-26 | Discharge: 2018-04-26 | Disposition: A | Discharge status: Home / Self Care | Payer: Managed Care, Other (non HMO) | Source: Ambulatory Visit | Attending: Neurology | Admitting: Neurology

## 2018-04-26 ENCOUNTER — Telehealth: Payer: Self-pay | Admitting: Neurology

## 2018-04-26 DIAGNOSIS — G7001 Myasthenia gravis with (acute) exacerbation: Secondary | ICD-10-CM | POA: Diagnosis present

## 2018-04-26 LAB — POCT I-STAT, CHEM 8
BUN: 14 mg/dL (ref 6–20)
CALCIUM ION: 1.25 mmol/L (ref 1.15–1.40)
CREATININE: 0.8 mg/dL (ref 0.44–1.00)
Chloride: 99 mmol/L (ref 98–111)
GLUCOSE: 152 mg/dL — AB (ref 70–99)
HCT: 35 % — ABNORMAL LOW (ref 36.0–46.0)
HEMOGLOBIN: 11.9 g/dL — AB (ref 12.0–15.0)
Potassium: 3.9 mmol/L (ref 3.5–5.1)
Sodium: 137 mmol/L (ref 135–145)
TCO2: 28 mmol/L (ref 22–32)

## 2018-04-26 MED ORDER — CALCIUM CARBONATE ANTACID 500 MG PO CHEW
2.0000 | CHEWABLE_TABLET | ORAL | Status: AC
  Administered 2018-04-26 (×2): 400 mg via ORAL

## 2018-04-26 MED ORDER — CALCIUM CARBONATE ANTACID 500 MG PO CHEW
CHEWABLE_TABLET | ORAL | Status: AC
  Administered 2018-04-26: 400 mg via ORAL
  Filled 2018-04-26: qty 2

## 2018-04-26 MED ORDER — ACD FORMULA A 0.73-2.45-2.2 GM/100ML VI SOLN
500.0000 mL | Status: DC
  Administered 2018-04-26: 500 mL via INTRAVENOUS

## 2018-04-26 MED ORDER — ACETAMINOPHEN 325 MG PO TABS: 650.0000 mg | ORAL_TABLET | ORAL | Status: DC | PRN

## 2018-04-26 MED ORDER — HEPARIN SODIUM (PORCINE) 1000 UNIT/ML IJ SOLN
1000.0000 [IU] | Freq: Once | INTRAMUSCULAR | Status: AC
  Administered 2018-04-26: 1000 [IU]

## 2018-04-26 MED ORDER — SODIUM CHLORIDE 0.9 % IV SOLN
2.0000 g | Freq: Once | INTRAVENOUS | Status: AC
  Administered 2018-04-26: 2 g via INTRAVENOUS
  Filled 2018-04-26: qty 20

## 2018-04-26 MED ORDER — DIPHENHYDRAMINE HCL 25 MG PO CAPS: 25.0000 mg | ORAL_CAPSULE | Freq: Four times a day (QID) | ORAL | Status: DC | PRN

## 2018-04-26 MED ORDER — HEPARIN SODIUM (PORCINE) 1000 UNIT/ML IJ SOLN
INTRAMUSCULAR | Status: AC
  Administered 2018-04-26: 1000 [IU]
  Filled 2018-04-26: qty 1

## 2018-04-26 MED ORDER — SODIUM CHLORIDE 0.9 % IV SOLN
INTRAVENOUS | Status: AC
  Administered 2018-04-26 (×3): via INTRAVENOUS_CENTRAL
  Filled 2018-04-26 (×3): qty 200

## 2018-04-26 MED ORDER — ACD FORMULA A 0.73-2.45-2.2 GM/100ML VI SOLN
Status: AC
  Administered 2018-04-26: 500 mL via INTRAVENOUS
  Filled 2018-04-26: qty 500

## 2018-04-26 NOTE — Telephone Encounter (Signed)
I will call the insurance company to get the PA asap.

## 2018-04-26 NOTE — Telephone Encounter (Signed)
April from [employer] called and left a vm on this patient needing to speak with you. Please call her back at [phone]. Thanks!

## 2018-05-06 ENCOUNTER — Ambulatory Visit (HOSPITAL_COMMUNITY)
Admission: RE | Admit: 2018-05-06 | Discharge: 2018-05-06 | Disposition: A | Discharge status: Home / Self Care | Payer: Managed Care, Other (non HMO) | Source: Ambulatory Visit | Attending: Neurology | Admitting: Neurology

## 2018-05-06 DIAGNOSIS — G7000 Myasthenia gravis without (acute) exacerbation: Secondary | ICD-10-CM | POA: Diagnosis not present

## 2018-05-06 LAB — CBC WITH DIFFERENTIAL/PLATELET
Abs Immature Granulocytes: 0.03 10*3/uL (ref 0.00–0.07)
BASOS PCT: 0 %
Basophils Absolute: 0 10*3/uL (ref 0.0–0.1)
EOS ABS: 0.3 10*3/uL (ref 0.0–0.5)
EOS PCT: 2 %
HEMATOCRIT: 40.7 % (ref 36.0–46.0)
HEMOGLOBIN: 12.6 g/dL (ref 12.0–15.0)
Immature Granulocytes: 0 %
Lymphocytes Relative: 8 %
Lymphs Abs: 1.1 10*3/uL (ref 0.7–4.0)
MCH: 26.6 pg (ref 26.0–34.0)
MCHC: 31 g/dL (ref 30.0–36.0)
MCV: 86 fL (ref 80.0–100.0)
MONO ABS: 0.5 10*3/uL (ref 0.1–1.0)
Monocytes Relative: 3 %
NEUTROS ABS: 12.7 10*3/uL — AB (ref 1.7–7.7)
Neutrophils Relative %: 87 %
Platelets: 266 10*3/uL (ref 150–400)
RBC: 4.73 MIL/uL (ref 3.87–5.11)
RDW: 13.2 % (ref 11.5–15.5)
WBC: 14.7 10*3/uL — AB (ref 4.0–10.5)
nRBC: 0 % (ref 0.0–0.2)

## 2018-05-06 LAB — POCT I-STAT, CHEM 8
BUN: 15 mg/dL (ref 6–20)
CALCIUM ION: 1.27 mmol/L (ref 1.15–1.40)
CREATININE: 0.8 mg/dL (ref 0.44–1.00)
Chloride: 101 mmol/L (ref 98–111)
GLUCOSE: 158 mg/dL — AB (ref 70–99)
HCT: 40 % (ref 36.0–46.0)
HEMOGLOBIN: 13.6 g/dL (ref 12.0–15.0)
POTASSIUM: 4.6 mmol/L (ref 3.5–5.1)
Sodium: 138 mmol/L (ref 135–145)
TCO2: 30 mmol/L (ref 22–32)

## 2018-05-06 MED ORDER — ACETAMINOPHEN 325 MG PO TABS: 650.0000 mg | ORAL_TABLET | ORAL | Status: DC | PRN

## 2018-05-06 MED ORDER — CALCIUM CARBONATE ANTACID 500 MG PO CHEW
CHEWABLE_TABLET | ORAL | Status: AC
  Administered 2018-05-06: 400 mg via ORAL
  Filled 2018-05-06: qty 2

## 2018-05-06 MED ORDER — ACD FORMULA A 0.73-2.45-2.2 GM/100ML VI SOLN
Status: AC
  Administered 2018-05-06: 500 mL via INTRAVENOUS
  Filled 2018-05-06: qty 500

## 2018-05-06 MED ORDER — ACD FORMULA A 0.73-2.45-2.2 GM/100ML VI SOLN
500.0000 mL | Status: DC
  Administered 2018-05-06: 500 mL via INTRAVENOUS

## 2018-05-06 MED ORDER — SODIUM CHLORIDE 0.9 % IV SOLN
3.0000 g | Freq: Once | INTRAVENOUS | Status: AC
  Administered 2018-05-06: 3 g via INTRAVENOUS
  Filled 2018-05-06: qty 30

## 2018-05-06 MED ORDER — CALCIUM CARBONATE ANTACID 500 MG PO CHEW
2.0000 | CHEWABLE_TABLET | ORAL | Status: DC
  Administered 2018-05-06: 400 mg via ORAL

## 2018-05-06 MED ORDER — SODIUM CHLORIDE 0.9 % IV SOLN
INTRAVENOUS | Status: AC
  Administered 2018-05-06 (×3): via INTRAVENOUS_CENTRAL
  Filled 2018-05-06 (×3): qty 200

## 2018-05-06 MED ORDER — DIPHENHYDRAMINE HCL 25 MG PO CAPS: 25.0000 mg | ORAL_CAPSULE | Freq: Four times a day (QID) | ORAL | Status: DC | PRN

## 2018-05-06 MED ORDER — ACD FORMULA A 0.73-2.45-2.2 GM/100ML VI SOLN
Status: AC
  Filled 2018-05-06: qty 500

## 2018-05-06 MED ORDER — HEPARIN SODIUM (PORCINE) 1000 UNIT/ML IJ SOLN
1000.0000 [IU] | Freq: Once | INTRAMUSCULAR | Status: AC
  Administered 2018-05-06: 1000 [IU]

## 2018-05-06 NOTE — Progress Notes (Signed)
Tolerated treatment with no issue, vss at termination of treatment, dc home with family, no complaints. DC via wheel chair,

## 2018-05-08 ENCOUNTER — Encounter: Payer: Self-pay | Admitting: *Deleted

## 2018-05-16 ENCOUNTER — Encounter: Payer: Self-pay | Admitting: Neurology

## 2018-05-16 ENCOUNTER — Ambulatory Visit: Payer: Managed Care, Other (non HMO) | Admitting: Neurology

## 2018-05-16 VITALS — BP 120/88 | HR 132 | Ht 66.0 in | Wt 197.0 lb

## 2018-05-16 DIAGNOSIS — G7001 Myasthenia gravis with (acute) exacerbation: Secondary | ICD-10-CM | POA: Diagnosis not present

## 2018-05-16 DIAGNOSIS — Z7952 Long term (current) use of systemic steroids: Secondary | ICD-10-CM

## 2018-05-16 MED ORDER — PREDNISONE 20 MG PO TABS: 50.0000 mg | ORAL_TABLET | Freq: Every day | ORAL | 5 refills | Status: DC

## 2018-05-16 MED ORDER — MYCOPHENOLATE MOFETIL 500 MG PO TABS: 500.0000 mg | ORAL_TABLET | Freq: Two times a day (BID) | ORAL | 5 refills | Status: DC

## 2018-05-16 NOTE — Patient Instructions (Addendum)
Have a lovely holidays with your family.  Merry Christmas!  We will start prior authorization for Solaris.  The company will contact you to get meningococcal vaccine (2 injections, 2 months apart).   In the meantime, continue your current medications   We will schedule your next plasmapheresis for January 17th, 2020  Return to clinic on January 16th at 3pm.

## 2018-05-16 NOTE — Progress Notes (Addendum)
Follow-up Visit   Date: 05/17/18    Suzanne Nguyen MRN: [id number] DOB: 1995-07-06   Interim History: Suzanne Nguyen is a 22 y.o. right-handed African American female with PCOS with refractory seropositive myasthenia gravis s/p thymectomy returning to the clinic for follow-up of myasthenia gravis.  The patient was accompanied to the clinic by self.  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  Over the next few days, she started getting tired easily and noticed difficulty with chewing, double vision, and droopiness of the right eye.  She was hospitalized at [facility] 3/19 - 08/24/2017 where MRI brain and CT A neck was unremarkable.  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  She was started on mestinon '30mg'$  TID and prednisone '10mg'$ .  She takes mestinon '30mg'$  at 7am, 3pm, and 10pm which has improved her ability to open her eyes.  She establish care with me in early April, at which time I started prednisone 30 mg which did not have marked benefit.  IVIG was started and improved her bulbar weakness.  She was continued on monthly IVIG over the summer when she returned home and while under the care of Dr. Thomasene Lot at [facility].   She underwent robotic assisted thymectomy on 12/18/2017 which required taking the left phrenic nerve.  Following surgery, she received IVIG, increased prednisone to '40mg'$  daily and then again admitted for plasmapheresis, which was completed in early August.  Her prednisone was increased to '60mg'$  daily and she was started on Cellcept '500mg'$  BID in September.   She was hospitalized from 9/13 - 02/25/2018 with myasthenia exacerbation due to progressive dysphagia and double vision.  She had 5 cycles of PLEX which significantly helped control her  weakness.  She has been continued on an extended out-patient course of twice per week for 3 weeks, then continued with weekly sessions.     UPDATE 05/16/2018:  She is here for follow-up visit.  She continues to get weekly PLEX and takes prednisone '50mg'$ /d, cellcept '500mg'$  BID, and mestinon '60mg'$  QID.   Overall, she is has been stable and continues to have right ptosis, flat smile, and fatigue.  When tired, she has double vision, which is worse when looking to the sides.  She takes breaks when chewing.  She rarely has choking spells and is gradually increasing textured foods. She has limb fatigue, no frank weakness. She will be going home for the holidays and returns back to [facility] on January 11th.    Medications:  Current Outpatient Medications on File Prior to Visit  Medication Sig Dispense Refill  . CALCIUM PO Take 600 mg by mouth 2 (two) times daily. With vitamin D3 800 units    . cholecalciferol (VITAMIN D) 1000 units tablet Take 1,000 Units by mouth daily.    Marland Kitchen ibuprofen (ADVIL,MOTRIN) 200 MG tablet Take 200 mg by mouth every 6 (six) hours as needed for mild pain.    . Multiple Vitamin (MULTIVITAMIN) tablet Take 1 tablet by mouth daily.    Marland Kitchen pyridostigmine (MESTINON) 60 MG tablet Take 1.5 tablet at 6am, 11am, 3pm, and 8pm. 240 tablet 0  . sulfamethoxazole-trimethoprim (BACTRIM DS,SEPTRA DS) 800-160 MG tablet Take 1 tablet by mouth every Monday, Wednesday, and Friday. 30 tablet 6   No current facility-administered medications on file prior to visit.  Allergies:  Allergies  Allergen Reactions  . Magnesium-Containing Compounds Anaphylaxis    paralysis  . Corticosteroids     Worsen symptoms of MYASTHENIA GRAVES  . Other Other (See Comments)    Mycins  . Procainamide     Irregular Heart beat  . Soy Allergy Itching    Review of Systems:  CONSTITUTIONAL: No fevers, chills, night sweats, or weight loss.  EYES: +visual changes or eye pain ENT: No hearing changes.  No history of nose  bleeds.   RESPIRATORY: No cough, wheezing and shortness of breath.   CARDIOVASCULAR: Negative for chest pain, and palpitations.   GI: No for abdominal discomfort, blood in stools or black stools.  No recent change in bowel habits.   GU:  No history of incontinence.   MUSCLOSKELETAL: No history of joint pain or swelling.  No myalgias.   SKIN: Negative for lesions, rash, and itching.   ENDOCRINE: Negative for cold or heat intolerance, polydipsia or goiter.   PSYCH:  No depression +anxiety symptoms.   NEURO: As Above.   Vital Signs:  BP 120/88   Pulse (!) 132   Ht '5\' 6"'$  (1.676 m)   Wt 197 lb (89.4 kg)   SpO2 97%   BMI 31.80 kg/m     General Medical Exam:   General:  Well appearing, comfortable  Eyes/ENT: see cranial nerve examination.   Neck: No masses appreciated.  Full range of motion without tenderness.  No carotid bruits. Respiratory:  Clear to auscultation, good air entry bilaterally.   Cardiac:  Tachycardic rate, regular rhythm, no murmur.   Ext:  No edema  Neurological Exam: MENTAL STATUS including orientation to time, place, person, recent and remote memory, attention span and concentration, language, and fund of knowledge is normal.  Speech is not dysarthric.  CRANIAL NERVES:  Pupils equal round and reactive to light.  There is mild right ptosis, worse with sustained up gaze.  Extraocular muscles are conjugate and intact bilaterally.  Facial muscles show mild weakness of the orbicularis oris, orbicularis oculi, and buccinator weakness, which is stable. Smile is transverse.  Tongue is midline and strength is 5/5.  Tongue side-to-side movements are normal.  MOTOR:  Motor strength is 5/5 in all extremities, including neck flexion. There is evidence of mild fatigability with neck flexion, limb strength does not fatigue.  COORDINATION/GAIT: Gait narrow based and stable.  She is easily able to stand up from a low chair without using arms to push off.   Data: Labs 08/22/2017:   AChR binding 7.86*, blocking 73*, modulating 58*, TSH 0.98, SSA/B neg, ACE 62, ANA neg, HIV neg, CRP 3.3*, ESR 28*  Lab Results  Component Value Date   CREATININE 0.80 05/06/2018   BUN 15 05/06/2018   NA 138 05/06/2018   K 4.6 05/06/2018   CL 101 05/06/2018   CO2 28 04/01/2018   Lab Results  Component Value Date   WBC 14.7 (H) 05/06/2018   HGB 12.6 05/06/2018   HCT 40.7 05/06/2018   MCV 86.0 05/06/2018   PLT 266 05/06/2018    CT head and neck w contrast 08/22/2017:   1. Anterior mediastinal mass, most consistent with thymoma. This supports the clinical concern for myasthenia gravis. 2. No arterial abnormality of the head or neck. 3. No intracranial abnormality.  MRI brain wwo contrast 08/22/2017:   1. Negative brain MRI. No acute intracranial abnormality identified. 2. Partially empty sella.   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  Diagnosed in March 2019 with 3 hospitalizations, no MG crisis requiring intubation.  Due to persistent bulbar weakness, her prednisone was increased to '60mg'$  daily and she was readmitted in September for plasmapheresis.  She completed 5 sessions of plasmapheresis while hospitalized and had resolution of dysarthria and improve dysphasia.  She has been getting weekly PLEX since this time and we have been able to avoid readmission for MG exacerbation. Unfortunately, she continues to symptomatic with generalized fatigue, ocular and bulbar weakness.  MG-ADL 11/24.  She will be continuing weekly PLEX at [facility] while home for the holidays and resume here on January 17th 2020.  Continue regimen as follows:  1.  Plasmapheresis every 7-10 days - appreciate Dr. Ronni Rumble assistance in coordinating her PLEX while [place] in [place]  2.  Continue prednisone to '50mg'$ . I will attempt slow and cautious taper when [place] returns from [place] in [place].   3.  Continue CellCept 500 mg  twice daily.    4.  Continue Mestinon 90 mg at 6 AM, 10 AM, 3 PM, and 8 PM.    I again discussed the need to add another immunomodulatory medication and she would be a good candidate for Solaris.  We discussed this medication at length and she is agreeable to initiating PA for this.  I will need to dose Solaris after her PLEX.  She will be enrolled in [employer] program for meningococcal vaccination.  She had many questions which were answered to the best of my ability.    Long term use of corticosteroids.  Continue calcium and vitamin D supplements Continue pepcid  Continue Bactrim DS Monday Wednesday Friday for PCP prophylaxis.     Return to clinic in 1 month  Greater than 50% of this 40 minute visit was spent in counseling, explanation of diagnosis, planning of further management, and coordination of care.     Thank you for allowing me to participate in patient's care.  If I can answer any additional questions, I would be pleased to do so.    Sincerely,    Ozzie Knobel K. Posey Pronto, DO

## 2018-05-17 ENCOUNTER — Non-Acute Institutional Stay (HOSPITAL_COMMUNITY)
Admission: AD | Admit: 2018-05-17 | Discharge: 2018-05-17 | Disposition: A | Discharge status: Home / Self Care | Payer: Managed Care, Other (non HMO) | Source: Ambulatory Visit | Attending: Foot & Ankle Surgery | Admitting: Foot & Ankle Surgery

## 2018-05-17 DIAGNOSIS — G7000 Myasthenia gravis without (acute) exacerbation: Secondary | ICD-10-CM | POA: Diagnosis present

## 2018-05-17 LAB — POCT I-STAT, CHEM 8
BUN: 16 mg/dL (ref 6–20)
Calcium, Ion: 1.3 mmol/L (ref 1.15–1.40)
Chloride: 101 mmol/L (ref 98–111)
Creatinine, Ser: 0.7 mg/dL (ref 0.44–1.00)
Glucose, Bld: 140 mg/dL — ABNORMAL HIGH (ref 70–99)
HCT: 37 % (ref 36.0–46.0)
Hemoglobin: 12.6 g/dL (ref 12.0–15.0)
Potassium: 4.3 mmol/L (ref 3.5–5.1)
Sodium: 138 mmol/L (ref 135–145)
TCO2: 29 mmol/L (ref 22–32)

## 2018-05-17 MED ORDER — SODIUM CHLORIDE 0.9 % IV SOLN
INTRAVENOUS | Status: DC
  Administered 2018-05-17 (×2): via INTRAVENOUS_CENTRAL
  Filled 2018-05-17 (×3): qty 200

## 2018-05-17 MED ORDER — DIPHENHYDRAMINE HCL 25 MG PO CAPS: 25.0000 mg | ORAL_CAPSULE | Freq: Four times a day (QID) | ORAL | Status: DC | PRN

## 2018-05-17 MED ORDER — HEPARIN SODIUM (PORCINE) 1000 UNIT/ML IJ SOLN
INTRAMUSCULAR | Status: AC
  Filled 2018-05-17: qty 4

## 2018-05-17 MED ORDER — SODIUM CHLORIDE 0.9 % IV SOLN
4.0000 g | Freq: Once | INTRAVENOUS | Status: AC
  Administered 2018-05-17: 4 g via INTRAVENOUS
  Filled 2018-05-17: qty 40

## 2018-05-17 MED ORDER — ACD FORMULA A 0.73-2.45-2.2 GM/100ML VI SOLN
500.0000 mL | Status: DC
  Administered 2018-05-17: 500 mL via INTRAVENOUS

## 2018-05-17 MED ORDER — CALCIUM CARBONATE ANTACID 500 MG PO CHEW
CHEWABLE_TABLET | ORAL | Status: AC
  Administered 2018-05-17: 400 mg via ORAL
  Filled 2018-05-17: qty 1

## 2018-05-17 MED ORDER — CALCIUM CARBONATE ANTACID 500 MG PO CHEW
2.0000 | CHEWABLE_TABLET | ORAL | Status: DC
  Administered 2018-05-17: 400 mg via ORAL

## 2018-05-17 MED ORDER — HEPARIN SODIUM (PORCINE) 1000 UNIT/ML IJ SOLN: 1000.0000 [IU] | Freq: Once | INTRAMUSCULAR | Status: DC

## 2018-05-17 MED ORDER — ACETAMINOPHEN 325 MG PO TABS: 650.0000 mg | ORAL_TABLET | ORAL | Status: DC | PRN

## 2018-05-17 MED ORDER — ACD FORMULA A 0.73-2.45-2.2 GM/100ML VI SOLN
Status: AC
  Administered 2018-05-17: 500 mL via INTRAVENOUS
  Filled 2018-05-17: qty 500

## 2018-05-17 NOTE — Progress Notes (Signed)
Tolerated pheresis treatment with no issues, vss, pt with no complaints, gait steady, dc in w/c to car,to home

## 2018-05-20 ENCOUNTER — Ambulatory Visit: Payer: Managed Care, Other (non HMO) | Admitting: Neurology

## 2018-05-23 ENCOUNTER — Telehealth: Payer: Self-pay | Admitting: *Deleted

## 2018-05-23 ENCOUNTER — Encounter: Payer: Self-pay | Admitting: *Deleted

## 2018-05-23 NOTE — Telephone Encounter (Signed)
05-06-18 until 08-05-18

## 2018-06-20 ENCOUNTER — Ambulatory Visit: Payer: Managed Care, Other (non HMO) | Admitting: Neurology

## 2018-06-20 ENCOUNTER — Encounter: Payer: Self-pay | Admitting: Neurology

## 2018-06-20 VITALS — BP 130/90 | HR 116 | Ht 66.0 in | Wt 195.0 lb

## 2018-06-20 DIAGNOSIS — Z7952 Long term (current) use of systemic steroids: Secondary | ICD-10-CM

## 2018-06-20 DIAGNOSIS — G7001 Myasthenia gravis with (acute) exacerbation: Secondary | ICD-10-CM

## 2018-06-20 NOTE — Patient Instructions (Addendum)
Start plasmapheresis every 10 days  We will follow-up with when you can be administered Solaris  Return to clinic 1 month

## 2018-06-20 NOTE — Progress Notes (Signed)
Follow-up Visit   Date: 06/20/18    Suzanne Nguyen MRN: [id number] DOB: May 30, 1996   Interim History: Suzanne Nguyen is a 23 y.o. right-handed African American female with PCOS with refractory seropositive myasthenia gravis s/p thymectomy returning to the clinic for follow-up of myasthenia gravis.  The patient was accompanied to the clinic by self.  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  Over the next few days, she started getting tired easily and noticed difficulty with chewing, double vision, and droopiness of the right eye.  She was hospitalized at [facility] 3/19 - 08/24/2017 where MRI brain and CT A neck was unremarkable.  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  She was started on mestinon '30mg'$  TID and prednisone '10mg'$ .  She takes mestinon '30mg'$  at 7am, 3pm, and 10pm which has improved her ability to open her eyes.  She establish care with me in early April, at which time I started prednisone 30 mg which did not have marked benefit.  IVIG was started and improved her bulbar weakness.  She was continued on monthly IVIG over the summer when she returned home and while under the care of Dr. Thomasene Lot at [facility].   She underwent robotic assisted thymectomy on 12/18/2017 which required taking the left phrenic nerve.  Following surgery, she received IVIG, increased prednisone to '40mg'$  daily and then again admitted for plasmapheresis, which was completed in early August.  Her prednisone was increased to '60mg'$  daily and she was started on Cellcept '500mg'$  BID in September.   She was hospitalized from 9/13 - 02/25/2018 with myasthenia exacerbation due to progressive dysphagia and double vision.  She had 5 cycles of PLEX which significantly helped control her  weakness.  She has been continued on an extended out-patient course of twice per week for 3 weeks, then continued with weekly sessions.     UPDATE 06/20/2018:  She is back from her winter break and did well with weekly PLEX at [facility].  She received her first series of meningococcal vaccination on 1/7.  She remains on prednisone 50 mg/d, Cellcept '500mg'$  BID, and mestinon '60mg'$  QID.  She continues to have right ptosis and intermittent double vision especially at end gaze.  She gets very fatigued especially at the end of the day, and finds it difficult to do anything after 8pm.  She continues to have very rare spells of choking.  She has shortness of breath when laying flat, and sleeps propped up against pillows.  She denies any new weakness of the arms or legs.  No neck heaviness.  Medications:  Current Outpatient Medications on File Prior to Visit  Medication Sig Dispense Refill  . CALCIUM PO Take 600 mg by mouth 2 (two) times daily. With vitamin D3 800 units    . cholecalciferol (VITAMIN D) 1000 units tablet Take 1,000 Units by mouth daily.    Marland Kitchen ibuprofen (ADVIL,MOTRIN) 200 MG tablet Take 200 mg by mouth every 6 (six) hours as needed for mild pain.    . Multiple Vitamin (MULTIVITAMIN) tablet Take 1 tablet by mouth daily.    . mycophenolate (CELLCEPT) 500 MG tablet Take 1 tablet (500 mg total) by mouth 2 (two) times daily. 60 tablet 5  . predniSONE (DELTASONE) 20 MG tablet Take 2.5 tablets (50 mg total)  by mouth daily with breakfast. 90 tablet 5  . pyridostigmine (MESTINON) 60 MG tablet Take 1.5 tablet at 6am, 11am, 3pm, and 8pm. 240 tablet 0  . sulfamethoxazole-trimethoprim (BACTRIM DS,SEPTRA DS) 800-160 MG tablet Take 1 tablet by mouth every Monday, Wednesday, and Friday. 30 tablet 6   No current facility-administered medications on file prior to visit.     Allergies:  Allergies  Allergen Reactions  . Magnesium-Containing Compounds Anaphylaxis    paralysis  .  Corticosteroids     Worsen symptoms of MYASTHENIA GRAVES  . Other Other (See Comments)    Mycins  . Procainamide     Irregular Heart beat  . Soy Allergy Itching    Review of Systems:  CONSTITUTIONAL: No fevers, chills, night sweats, or weight loss.  EYES: +visual changes or eye pain ENT: No hearing changes.  No history of nose bleeds.   RESPIRATORY: No cough, wheezing and shortness of breath.   CARDIOVASCULAR: Negative for chest pain, and palpitations.   GI: No for abdominal discomfort, blood in stools or black stools.  No recent change in bowel habits.   GU:  No history of incontinence.   MUSCLOSKELETAL: No history of joint pain or swelling.  No myalgias.   SKIN: Negative for lesions, rash, and itching.   ENDOCRINE: Negative for cold or heat intolerance, polydipsia or goiter.   PSYCH:  No depression +anxiety symptoms.   NEURO: As Above.   Vital Signs:  BP 130/90   Pulse (!) 116   Ht '5\' 6"'$  (1.676 m)   Wt 195 lb (88.5 kg)   SpO2 98%   BMI 31.47 kg/m     General Medical Exam:   General:  Well appearing, comfortable  Eyes/ENT: see cranial nerve examination.   Neck: No masses appreciated.  Full range of motion without tenderness.  No carotid bruits. Respiratory:  Clear to auscultation, good air entry bilaterally.   Cardiac:  Tachycardic rate, regular rhythm, no murmur.   Ext:  No edema  Neurological Exam: MENTAL STATUS including orientation to time, place, person, recent and remote memory, attention span and concentration, language, and fund of knowledge is normal.  Speech is not dysarthric.  CRANIAL NERVES:  Pupils equal round and reactive to light.  There is mild right ptosis, worse with sustained up gaze.  Extraocular muscles are conjugate and intact bilaterally.  Facial muscles show mild weakness of the orbicularis oris, orbicularis oculi, and buccinator weakness, which is stable.   Tongue is midline and strength is 5/5.  Tongue side-to-side movements are normal.  MOTOR:   Motor strength is 5/5 in all extremities, including neck flexion. There is evidence of mild fatigability with neck flexion, limb strength does not fatigue.  COORDINATION/GAIT: Gait narrow based and stable.  She is easily able to stand up from a low chair without using arms to push off.   Data: Labs 08/22/2017:  AChR binding 7.86*, blocking 73*, modulating 58*, TSH 0.98, SSA/B neg, ACE 62, ANA neg, HIV neg, CRP 3.3*, ESR 28*  Lab Results  Component Value Date   CREATININE 0.70 05/17/2018   BUN 16 05/17/2018   NA 138 05/17/2018   K 4.3 05/17/2018   CL 101 05/17/2018   CO2 28 04/01/2018   Lab Results  Component Value Date   WBC 14.7 (H) 05/06/2018   HGB 12.6 05/17/2018   HCT 37.0 05/17/2018   MCV 86.0 05/06/2018   PLT 266 05/06/2018    CT head and neck w contrast 08/22/2017:   1. Anterior  mediastinal mass, most consistent with thymoma. This supports the clinical concern for myasthenia gravis. 2. No arterial abnormality of the head or neck. 3. No intracranial abnormality.  MRI brain wwo contrast 08/22/2017:   1. Negative brain MRI. No acute intracranial abnormality identified. 2. Partially empty sella.    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  Diagnosed in March 2019 with 3 hospitalizations, no MG crisis requiring intubation.  Due to persistent bulbar weakness, her prednisone was increased to '60mg'$  daily and she was readmitted in September for plasmapheresis.  She completed 5 sessions of plasmapheresis while hospitalized and had resolution of dysarthria and improve dysphasia.  She has been getting weekly PLEX since this time and we have been able to avoid readmission for MG exacerbation. Unfortunately, she continues to symptomatic with generalized fatigue, ocular and bulbar weakness.  MG-ADL 10/24.   Continue regimen as follows:  1.  Plasmapheresis every 10 days - restart on 06/25/2018 (she prefers  Tuesday/Thur, as she has classes MWF).  Check CBC and CMP  2.  PA in process for Solaris which will be dose AFTER PLEX  3.  Continue prednisone to '50mg'$ . Start to taper once Solaris has been started  4.  Continue CellCept 500 mg twice daily. Hopefully, by March we can start to appreciate immunomodulatory effects of Cellcept   5.  Continue Mestinon 60 mg at 6 AM, 10 AM, 3 PM, and 8 PM.    Long term use of corticosteroids.  Continue calcium and vitamin D supplements Continue pepcid  Continue Bactrim DS Monday Wednesday Friday for PCP prophylaxis.     Return to clinic in 1 month  Greater than 50% of this 30 minute visit was spent in counseling, explanation of diagnosis, planning of further management, and coordination of care.      Thank you for allowing me to participate in patient's care.  If I can answer any additional questions, I would be pleased to do so.    Sincerely,    Donika K. Posey Pronto, DO

## 2018-06-21 ENCOUNTER — Other Ambulatory Visit: Payer: Self-pay | Admitting: *Deleted

## 2018-06-21 DIAGNOSIS — G7001 Myasthenia gravis with (acute) exacerbation: Secondary | ICD-10-CM

## 2018-06-21 DIAGNOSIS — G7000 Myasthenia gravis without (acute) exacerbation: Secondary | ICD-10-CM

## 2018-06-24 ENCOUNTER — Encounter: Payer: Self-pay | Admitting: *Deleted

## 2018-06-25 ENCOUNTER — Ambulatory Visit (HOSPITAL_COMMUNITY)
Admission: RE | Admit: 2018-06-25 | Discharge: 2018-06-25 | Disposition: A | Discharge status: Home / Self Care | Payer: Managed Care, Other (non HMO) | Source: Ambulatory Visit | Attending: Neurology | Admitting: Neurology

## 2018-06-25 DIAGNOSIS — G7001 Myasthenia gravis with (acute) exacerbation: Secondary | ICD-10-CM | POA: Diagnosis present

## 2018-06-25 LAB — CBC WITH DIFFERENTIAL/PLATELET
Abs Immature Granulocytes: 0.07 10*3/uL (ref 0.00–0.07)
BASOS ABS: 0.1 10*3/uL (ref 0.0–0.1)
Basophils Relative: 1 %
Eosinophils Absolute: 0 10*3/uL (ref 0.0–0.5)
Eosinophils Relative: 0 %
HCT: 37.2 % (ref 36.0–46.0)
Hemoglobin: 10.8 g/dL — ABNORMAL LOW (ref 12.0–15.0)
Immature Granulocytes: 1 %
Lymphocytes Relative: 8 %
Lymphs Abs: 1.1 10*3/uL (ref 0.7–4.0)
MCH: 25.1 pg — ABNORMAL LOW (ref 26.0–34.0)
MCHC: 29 g/dL — AB (ref 30.0–36.0)
MCV: 86.5 fL (ref 80.0–100.0)
Monocytes Absolute: 0.4 10*3/uL (ref 0.1–1.0)
Monocytes Relative: 3 %
NRBC: 0 % (ref 0.0–0.2)
Neutro Abs: 12.8 10*3/uL — ABNORMAL HIGH (ref 1.7–7.7)
Neutrophils Relative %: 87 %
Platelets: 311 10*3/uL (ref 150–400)
RBC: 4.3 MIL/uL (ref 3.87–5.11)
RDW: 14.2 % (ref 11.5–15.5)
WBC: 14.4 10*3/uL — ABNORMAL HIGH (ref 4.0–10.5)

## 2018-06-25 LAB — BASIC METABOLIC PANEL
Anion gap: 11 (ref 5–15)
BUN: 12 mg/dL (ref 6–20)
CALCIUM: 9.4 mg/dL (ref 8.9–10.3)
CO2: 27 mmol/L (ref 22–32)
CREATININE: 0.77 mg/dL (ref 0.44–1.00)
Chloride: 100 mmol/L (ref 98–111)
GFR calc non Af Amer: >60 mL/min (ref >60)
Glucose, Bld: 185 mg/dL — ABNORMAL HIGH (ref 70–99)
Potassium: 3.7 mmol/L (ref 3.5–5.1)
Sodium: 138 mmol/L (ref 135–145)

## 2018-06-25 LAB — POCT I-STAT, CHEM 8
BUN: 13 mg/dL (ref 6–20)
Calcium, Ion: 1.29 mmol/L (ref 1.15–1.40)
Chloride: 100 mmol/L (ref 98–111)
Creatinine, Ser: 0.7 mg/dL (ref 0.44–1.00)
Glucose, Bld: 177 mg/dL — ABNORMAL HIGH (ref 70–99)
HCT: 37 % (ref 36.0–46.0)
Hemoglobin: 12.6 g/dL (ref 12.0–15.0)
Potassium: 3.7 mmol/L (ref 3.5–5.1)
Sodium: 138 mmol/L (ref 135–145)
TCO2: 28 mmol/L (ref 22–32)

## 2018-06-25 MED ORDER — CALCIUM CARBONATE ANTACID 500 MG PO CHEW
2.0000 | CHEWABLE_TABLET | ORAL | Status: DC
  Administered 2018-06-25: 400 mg via ORAL

## 2018-06-25 MED ORDER — ACD FORMULA A 0.73-2.45-2.2 GM/100ML VI SOLN
Status: AC
  Administered 2018-06-25: 500 mL via INTRAVENOUS
  Filled 2018-06-25: qty 500

## 2018-06-25 MED ORDER — HEPARIN SODIUM (PORCINE) 1000 UNIT/ML IJ SOLN: 1000.0000 [IU] | Freq: Once | INTRAMUSCULAR | Status: DC

## 2018-06-25 MED ORDER — ACD FORMULA A 0.73-2.45-2.2 GM/100ML VI SOLN
500.0000 mL | Status: DC
  Administered 2018-06-25: 500 mL via INTRAVENOUS

## 2018-06-25 MED ORDER — ACETAMINOPHEN 325 MG PO TABS: 650.0000 mg | ORAL_TABLET | ORAL | Status: DC | PRN

## 2018-06-25 MED ORDER — DIPHENHYDRAMINE HCL 25 MG PO CAPS: 25.0000 mg | ORAL_CAPSULE | Freq: Four times a day (QID) | ORAL | Status: DC | PRN

## 2018-06-25 MED ORDER — HEPARIN SODIUM (PORCINE) 1000 UNIT/ML IJ SOLN
INTRAMUSCULAR | Status: AC
  Filled 2018-06-25: qty 4

## 2018-06-25 MED ORDER — SODIUM CHLORIDE 0.9 % IV SOLN
INTRAVENOUS | Status: AC
  Administered 2018-06-25 (×3): via INTRAVENOUS_CENTRAL
  Filled 2018-06-25 (×3): qty 200

## 2018-06-25 MED ORDER — CALCIUM CARBONATE ANTACID 500 MG PO CHEW
CHEWABLE_TABLET | ORAL | Status: AC
  Administered 2018-06-25: 400 mg via ORAL
  Filled 2018-06-25: qty 4

## 2018-06-25 MED ORDER — SODIUM CHLORIDE 0.9 % IV SOLN
2.0000 g | Freq: Once | INTRAVENOUS | Status: AC
  Administered 2018-06-25: 2 g via INTRAVENOUS
  Filled 2018-06-25: qty 20

## 2018-06-25 NOTE — Progress Notes (Signed)
tx completed without complications; well tolerated; patient denies pain; n/v; dizziness or paresthesia. Accompanied to the main entrance on a wheelchair to go back to school. Patient advised her next TPE is 07/04/2018.

## 2018-06-27 ENCOUNTER — Inpatient Hospital Stay (HOSPITAL_COMMUNITY): Admission: RE | Admit: 2018-06-27 | Payer: Managed Care, Other (non HMO) | Source: Ambulatory Visit

## 2018-07-02 ENCOUNTER — Encounter (HOSPITAL_COMMUNITY): Payer: Managed Care, Other (non HMO)

## 2018-07-04 ENCOUNTER — Encounter (HOSPITAL_COMMUNITY): Payer: Managed Care, Other (non HMO)

## 2018-07-04 ENCOUNTER — Non-Acute Institutional Stay (HOSPITAL_COMMUNITY)
Admission: AD | Admit: 2018-07-04 | Discharge: 2018-07-04 | Disposition: A | Discharge status: Home / Self Care | Payer: Managed Care, Other (non HMO) | Source: Ambulatory Visit | Attending: Neurology | Admitting: Neurology

## 2018-07-04 DIAGNOSIS — G7001 Myasthenia gravis with (acute) exacerbation: Secondary | ICD-10-CM | POA: Insufficient documentation

## 2018-07-04 LAB — BASIC METABOLIC PANEL
Anion gap: 11 (ref 5–15)
BUN: 8 mg/dL (ref 6–20)
CO2: 26 mmol/L (ref 22–32)
Calcium: 9.6 mg/dL (ref 8.9–10.3)
Chloride: 102 mmol/L (ref 98–111)
Creatinine, Ser: 0.71 mg/dL (ref 0.44–1.00)
GFR calc Af Amer: >60 mL/min (ref >60)
Glucose, Bld: 122 mg/dL — ABNORMAL HIGH (ref 70–99)
Potassium: 4 mmol/L (ref 3.5–5.1)
Sodium: 139 mmol/L (ref 135–145)

## 2018-07-04 LAB — POCT I-STAT 7, (LYTES, BLD GAS, ICA,H+H)
Acid-Base Excess: 2 mmol/L (ref 0.0–2.0)
BICARBONATE: 28.4 mmol/L — AB (ref 20.0–28.0)
Calcium, Ion: 1.28 mmol/L (ref 1.15–1.40)
HCT: 36 % (ref 36.0–46.0)
Hemoglobin: 12.2 g/dL (ref 12.0–15.0)
O2 Saturation: 66 %
Potassium: 4 mmol/L (ref 3.5–5.1)
Sodium: 138 mmol/L (ref 135–145)
TCO2: 30 mmol/L (ref 22–32)
pCO2 arterial: 50.8 mmHg — ABNORMAL HIGH (ref 32.0–48.0)
pH, Arterial: 7.356 (ref 7.350–7.450)
pO2, Arterial: 36 mmHg — CL (ref 83.0–108.0)

## 2018-07-04 MED ORDER — ACETAMINOPHEN 325 MG PO TABS: 650.0000 mg | ORAL_TABLET | ORAL | Status: DC | PRN

## 2018-07-04 MED ORDER — HEPARIN SODIUM (PORCINE) 1000 UNIT/ML IJ SOLN
INTRAMUSCULAR | Status: AC
  Filled 2018-07-04: qty 4

## 2018-07-04 MED ORDER — SODIUM CHLORIDE 0.9 % IV SOLN
3.0000 g | Freq: Once | INTRAVENOUS | Status: AC
  Administered 2018-07-04: 3 g via INTRAVENOUS
  Filled 2018-07-04: qty 30

## 2018-07-04 MED ORDER — ACD FORMULA A 0.73-2.45-2.2 GM/100ML VI SOLN
Status: AC
  Filled 2018-07-04: qty 500

## 2018-07-04 MED ORDER — SODIUM CHLORIDE 0.9 % IV SOLN
INTRAVENOUS | Status: DC
  Administered 2018-07-04 (×2): via INTRAVENOUS_CENTRAL
  Filled 2018-07-04 (×3): qty 200

## 2018-07-04 MED ORDER — ACD FORMULA A 0.73-2.45-2.2 GM/100ML VI SOLN
500.0000 mL | Status: DC
  Administered 2018-07-04: 500 mL via INTRAVENOUS

## 2018-07-04 MED ORDER — CALCIUM CARBONATE ANTACID 500 MG PO CHEW
2.0000 | CHEWABLE_TABLET | ORAL | Status: DC
  Administered 2018-07-04: 400 mg via ORAL

## 2018-07-04 MED ORDER — DIPHENHYDRAMINE HCL 25 MG PO CAPS: 25.0000 mg | ORAL_CAPSULE | Freq: Four times a day (QID) | ORAL | Status: DC | PRN

## 2018-07-04 MED ORDER — HEPARIN SODIUM (PORCINE) 1000 UNIT/ML IJ SOLN
1000.0000 [IU] | Freq: Once | INTRAMUSCULAR | Status: AC
  Administered 2018-07-04: 1000 [IU]

## 2018-07-04 MED ORDER — CALCIUM CARBONATE ANTACID 500 MG PO CHEW
CHEWABLE_TABLET | ORAL | Status: AC
  Filled 2018-07-04: qty 2

## 2018-07-04 NOTE — Progress Notes (Signed)
Tolerated plasmapheresis treatment with no issues, post treatment pt has no complaints, vital sign stable, denies pain. Patient dc home with family

## 2018-07-11 ENCOUNTER — Encounter: Payer: Self-pay | Admitting: *Deleted

## 2018-07-16 ENCOUNTER — Ambulatory Visit (HOSPITAL_COMMUNITY)
Admission: RE | Admit: 2018-07-16 | Discharge: 2018-07-16 | Disposition: A | Discharge status: Home / Self Care | Payer: Managed Care, Other (non HMO) | Source: Ambulatory Visit | Attending: Neurology | Admitting: Neurology

## 2018-07-16 DIAGNOSIS — Z79899 Other long term (current) drug therapy: Secondary | ICD-10-CM | POA: Insufficient documentation

## 2018-07-16 DIAGNOSIS — G7001 Myasthenia gravis with (acute) exacerbation: Secondary | ICD-10-CM | POA: Diagnosis present

## 2018-07-16 LAB — POCT I-STAT EG7
Acid-Base Excess: 3 mmol/L — ABNORMAL HIGH (ref 0.0–2.0)
Bicarbonate: 29.9 mmol/L — ABNORMAL HIGH (ref 20.0–28.0)
Calcium, Ion: 1.26 mmol/L (ref 1.15–1.40)
HCT: 39 % (ref 36.0–46.0)
Hemoglobin: 13.3 g/dL (ref 12.0–15.0)
O2 Saturation: 61 %
Potassium: 4 mmol/L (ref 3.5–5.1)
Sodium: 140 mmol/L (ref 135–145)
TCO2: 31 mmol/L (ref 22–32)
pCO2, Ven: 51.8 mmHg (ref 44.0–60.0)
pH, Ven: 7.369 (ref 7.250–7.430)
pO2, Ven: 33 mmHg (ref 32.0–45.0)

## 2018-07-16 MED ORDER — ACETAMINOPHEN 325 MG PO TABS: 650.0000 mg | ORAL_TABLET | ORAL | Status: DC | PRN

## 2018-07-16 MED ORDER — DIPHENHYDRAMINE HCL 25 MG PO CAPS: 25.0000 mg | ORAL_CAPSULE | Freq: Four times a day (QID) | ORAL | Status: DC | PRN

## 2018-07-16 MED ORDER — HEPARIN SODIUM (PORCINE) 1000 UNIT/ML IJ SOLN: 1000.0000 [IU] | Freq: Once | INTRAMUSCULAR | Status: DC

## 2018-07-16 MED ORDER — ACD FORMULA A 0.73-2.45-2.2 GM/100ML VI SOLN
500.0000 mL | Status: DC
  Administered 2018-07-16: 500 mL via INTRAVENOUS

## 2018-07-16 MED ORDER — SODIUM CHLORIDE 0.9 % IV SOLN: Freq: Once | INTRAVENOUS | Status: DC

## 2018-07-16 MED ORDER — SODIUM CHLORIDE 0.9 % IV SOLN
INTRAVENOUS | Status: AC
  Administered 2018-07-16 (×3): via INTRAVENOUS_CENTRAL
  Filled 2018-07-16 (×3): qty 200

## 2018-07-16 MED ORDER — ACD FORMULA A 0.73-2.45-2.2 GM/100ML VI SOLN: 500.0000 mL | Status: DC

## 2018-07-16 MED ORDER — SODIUM CHLORIDE 0.9 % IV SOLN: 2.0000 g | Freq: Once | INTRAVENOUS | Status: DC

## 2018-07-16 MED ORDER — CALCIUM CARBONATE ANTACID 500 MG PO CHEW: 2.0000 | CHEWABLE_TABLET | ORAL | Status: DC

## 2018-07-16 MED ORDER — CALCIUM CARBONATE ANTACID 500 MG PO CHEW
CHEWABLE_TABLET | ORAL | Status: AC
  Administered 2018-07-16: 400 mg
  Filled 2018-07-16: qty 2

## 2018-07-16 MED ORDER — CALCIUM GLUCONATE 10 % IV SOLN: 2.0000 g | Freq: Once | INTRAVENOUS | Status: DC

## 2018-07-16 MED ORDER — HEPARIN SODIUM (PORCINE) 1000 UNIT/ML IJ SOLN
INTRAMUSCULAR | Status: AC
  Filled 2018-07-16: qty 1

## 2018-07-16 MED ORDER — SODIUM CHLORIDE 0.9 % IV SOLN
2.0000 g | Freq: Once | INTRAVENOUS | Status: AC
  Administered 2018-07-16: 2 g via INTRAVENOUS
  Filled 2018-07-16: qty 20

## 2018-07-16 MED ORDER — ACD FORMULA A 0.73-2.45-2.2 GM/100ML VI SOLN
Status: AC
  Filled 2018-07-16: qty 500

## 2018-07-18 ENCOUNTER — Telehealth: Payer: Self-pay | Admitting: Neurology

## 2018-07-18 NOTE — Telephone Encounter (Signed)
April from the [employer] wants to speak with you about this patient's authorization. Please call her back at [phone]. Thanks!

## 2018-07-19 NOTE — Telephone Encounter (Signed)
Left message for Suzanne Nguyen  to call me back 

## 2018-07-23 ENCOUNTER — Ambulatory Visit: Payer: Managed Care, Other (non HMO) | Admitting: Family Medicine

## 2018-07-23 ENCOUNTER — Non-Acute Institutional Stay (HOSPITAL_COMMUNITY)
Admission: AD | Admit: 2018-07-23 | Discharge: 2018-07-23 | Disposition: A | Discharge status: Home / Self Care | Payer: Managed Care, Other (non HMO) | Source: Ambulatory Visit | Attending: Neurology | Admitting: Neurology

## 2018-07-23 ENCOUNTER — Encounter: Payer: Self-pay | Admitting: Neurology

## 2018-07-23 ENCOUNTER — Ambulatory Visit: Payer: Managed Care, Other (non HMO) | Admitting: Neurology

## 2018-07-23 VITALS — BP 120/88 | HR 123 | Ht 65.0 in | Wt 195.4 lb

## 2018-07-23 DIAGNOSIS — G7001 Myasthenia gravis with (acute) exacerbation: Secondary | ICD-10-CM | POA: Insufficient documentation

## 2018-07-23 DIAGNOSIS — Z79899 Other long term (current) drug therapy: Secondary | ICD-10-CM | POA: Diagnosis not present

## 2018-07-23 DIAGNOSIS — Z79890 Hormone replacement therapy (postmenopausal): Secondary | ICD-10-CM | POA: Diagnosis not present

## 2018-07-23 LAB — POCT I-STAT EG7
Acid-Base Excess: 5 mmol/L — ABNORMAL HIGH (ref 0.0–2.0)
Bicarbonate: 31.8 mmol/L — ABNORMAL HIGH (ref 20.0–28.0)
Calcium, Ion: 1.25 mmol/L (ref 1.15–1.40)
HCT: 36 % (ref 36.0–46.0)
Hemoglobin: 12.2 g/dL (ref 12.0–15.0)
O2 Saturation: 60 %
Potassium: 3.7 mmol/L (ref 3.5–5.1)
Sodium: 140 mmol/L (ref 135–145)
TCO2: 33 mmol/L — AB (ref 22–32)
pCO2, Ven: 54.7 mmHg (ref 44.0–60.0)
pH, Ven: 7.372 (ref 7.250–7.430)
pO2, Ven: 33 mmHg (ref 32.0–45.0)

## 2018-07-23 LAB — BASIC METABOLIC PANEL
Anion gap: 11 (ref 5–15)
BUN: 12 mg/dL (ref 6–20)
CO2: 28 mmol/L (ref 22–32)
Calcium: 9.7 mg/dL (ref 8.9–10.3)
Chloride: 100 mmol/L (ref 98–111)
Creatinine, Ser: 0.65 mg/dL (ref 0.44–1.00)
GFR calc Af Amer: >60 mL/min (ref >60)
GFR calc non Af Amer: >60 mL/min (ref >60)
Glucose, Bld: 144 mg/dL — ABNORMAL HIGH (ref 70–99)
Potassium: 3.6 mmol/L (ref 3.5–5.1)
Sodium: 139 mmol/L (ref 135–145)

## 2018-07-23 MED ORDER — HEPARIN SODIUM (PORCINE) 1000 UNIT/ML IJ SOLN
INTRAMUSCULAR | Status: AC
  Administered 2018-07-23: 3200 [IU]
  Filled 2018-07-23: qty 3

## 2018-07-23 MED ORDER — CALCIUM CARBONATE ANTACID 500 MG PO CHEW
2.0000 | CHEWABLE_TABLET | ORAL | Status: AC
  Administered 2018-07-23 (×2): 400 mg via ORAL

## 2018-07-23 MED ORDER — CALCIUM CARBONATE ANTACID 500 MG PO CHEW
CHEWABLE_TABLET | ORAL | Status: AC
  Administered 2018-07-23: 400 mg via ORAL
  Filled 2018-07-23: qty 4

## 2018-07-23 MED ORDER — SODIUM CHLORIDE 0.9 % IV SOLN
2.0000 g | Freq: Once | INTRAVENOUS | Status: AC
  Administered 2018-07-23: 2 g via INTRAVENOUS
  Filled 2018-07-23: qty 20

## 2018-07-23 MED ORDER — HEPARIN SODIUM (PORCINE) 1000 UNIT/ML IJ SOLN
1000.0000 [IU] | Freq: Once | INTRAMUSCULAR | Status: AC
  Administered 2018-07-23: 3200 [IU]

## 2018-07-23 MED ORDER — DIPHENHYDRAMINE HCL 25 MG PO CAPS: 25.0000 mg | ORAL_CAPSULE | Freq: Four times a day (QID) | ORAL | Status: DC | PRN

## 2018-07-23 MED ORDER — ACD FORMULA A 0.73-2.45-2.2 GM/100ML VI SOLN
Status: AC
  Administered 2018-07-23: 500 mL via INTRAVENOUS
  Filled 2018-07-23: qty 500

## 2018-07-23 MED ORDER — ACD FORMULA A 0.73-2.45-2.2 GM/100ML VI SOLN
500.0000 mL | Status: DC
  Administered 2018-07-23: 500 mL via INTRAVENOUS

## 2018-07-23 MED ORDER — ACETAMINOPHEN 325 MG PO TABS: 650.0000 mg | ORAL_TABLET | ORAL | Status: DC | PRN

## 2018-07-23 MED ORDER — SODIUM CHLORIDE 0.9 % IV SOLN
INTRAVENOUS | Status: AC
  Administered 2018-07-23 (×3): via INTRAVENOUS_CENTRAL
  Filled 2018-07-23 (×3): qty 200

## 2018-07-23 NOTE — Progress Notes (Signed)
tx completed without complication; pt denies pain, dizziness, weakness, SOB, or n/v. accompanied the patient to the main entrance on a wheelchair to go home.

## 2018-07-23 NOTE — Progress Notes (Signed)
Follow-up Visit   Date: 07/23/18    Suzanne Nguyen MRN: [id number] DOB: [birth year]   Interim History: Suzanne Nguyen is a 23 y.o. right-handed African American female with PCOS with refractory seropositive myasthenia gravis s/p thymectomy returning to the clinic for follow-up of myasthenia gravis.  The patient was accompanied to the clinic by self.  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  Over the next few days, she started getting tired easily and noticed difficulty with chewing, double vision, and droopiness of the right eye.  She was hospitalized at [facility] 3/19 - 08/24/2017 where MRI brain and CT A neck was unremarkable.  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  She was started on mestinon '30mg'$  TID and prednisone '10mg'$ .  She takes mestinon '30mg'$  at 7am, 3pm, and 10pm which has improved her ability to open her eyes.  She establish care with me in early April, at which time I started prednisone 30 mg which did not have marked benefit.  IVIG was started and improved her bulbar weakness.  She was continued on monthly IVIG over the summer when she returned home and while under the care of Dr. Thomasene Lot at [facility].   She underwent robotic assisted thymectomy on 12/18/2017 which required taking the left phrenic nerve.  Following surgery, she received IVIG, increased prednisone to '40mg'$  daily and then again admitted for plasmapheresis, which was completed in early August.  Her prednisone was increased to '60mg'$  daily and she was started on Cellcept '500mg'$  BID in September.   She was hospitalized from 9/13 - 02/25/2018 with myasthenia exacerbation due to progressive dysphagia and double vision.  She had 5 cycles of PLEX which significantly helped control her  weakness.  She has been continued on an extended out-patient course of twice per week for 3 weeks, then continued with weekly sessions.     UPDATE 06/20/2018:  She is back from her winter break and did well with weekly PLEX at [facility].  She received her first series of meningococcal vaccination on 1/7.  She remains on prednisone 50 mg/d, Cellcept '500mg'$  BID, and mestinon '60mg'$  QID.  She continues to have right ptosis and intermittent double vision especially at end gaze.  She gets very fatigued especially at the end of the day, and finds it difficult to do anything after 8pm.  She continues to have very rare spells of choking.  She has shortness of breath when laying flat, and sleeps propped up against pillows.  She denies any new weakness of the arms or legs.  No neck heaviness.  UPDATE 07/23/2018:  She is here for follow-up visit.  She continues to be on prednisone '50mg'$ /d, cellcept '500mg'$  BID, weekly PLEX, and mestinon '60mg'$  7am, noon, 4pm, and 8pm.  She has noticed that her strength and overall fatigue is better.  Eyes are staying open longer and she has less double vision.  Unfortunately, she still can't raise her cheeks when smiling.  She has mild dysarthria after prolonged talking.  She is now approved for Soliris and she is waiting to hear back from her insurance where she can get infusion.  Her plasmapheresis will need to be scheduled around [facility].    Medications:  Current Outpatient Medications on File Prior to Visit  Medication Sig Dispense Refill  .  CALCIUM PO Take 600 mg by mouth 2 (two) times daily. With vitamin D3 800 units    . cholecalciferol (VITAMIN D) 1000 units tablet Take 1,000 Units by mouth daily.    Marland Kitchen ibuprofen (ADVIL,MOTRIN) 200 MG tablet Take 200 mg by mouth every 6 (six) hours as needed for mild pain.    . Multiple Vitamin (MULTIVITAMIN) tablet Take 1 tablet by mouth daily.    . mycophenolate (CELLCEPT) 500 MG tablet Take 1 tablet (500 mg total) by mouth 2  (two) times daily. 60 tablet 5  . predniSONE (DELTASONE) 20 MG tablet Take 2.5 tablets (50 mg total) by mouth daily with breakfast. 90 tablet 5  . pyridostigmine (MESTINON) 60 MG tablet Take 1.5 tablet at 6am, 11am, 3pm, and 8pm. 240 tablet 0  . sulfamethoxazole-trimethoprim (BACTRIM DS,SEPTRA DS) 800-160 MG tablet Take 1 tablet by mouth every Monday, Wednesday, and Friday. 30 tablet 6   No current facility-administered medications on file prior to visit.     Allergies:  Allergies  Allergen Reactions  . Magnesium-Containing Compounds Anaphylaxis    paralysis  . Corticosteroids     Worsen symptoms of MYASTHENIA GRAVES  . Other Other (See Comments)    Mycins  . Procainamide     Irregular Heart beat  . Soy Allergy Itching    Review of Systems:  CONSTITUTIONAL: No fevers, chills, night sweats, or weight loss.  EYES: +visual changes or eye pain ENT: No hearing changes.  No history of nose bleeds.   RESPIRATORY: No cough, wheezing and shortness of breath.   CARDIOVASCULAR: Negative for chest pain, and palpitations.   GI: No for abdominal discomfort, blood in stools or black stools.  No recent change in bowel habits.   GU:  No history of incontinence.   MUSCLOSKELETAL: No history of joint pain or swelling.  No myalgias.   SKIN: Negative for lesions, rash, and itching.   ENDOCRINE: Negative for cold or heat intolerance, polydipsia or goiter.   PSYCH:  No depression +anxiety symptoms.   NEURO: As Above.   Vital Signs:  BP 120/88   Pulse (!) 123   Ht '5\' 5"'$  (1.651 m)   Wt 195 lb 6 oz (88.6 kg)   SpO2 97%   BMI 32.51 kg/m     General Medical Exam:   General:  Well appearing, comfortable  Eyes/ENT: see cranial nerve examination.   Neck:   No carotid bruits. Respiratory:  Clear to auscultation, good air entry bilaterally.   Cardiac:  Tachycardic rate and rhythm, no murmur.   Ext:  No edema  Neurological Exam: MENTAL STATUS including orientation to time, place, person, recent  and remote memory, attention span and concentration, language, and fund of knowledge is normal.  Speech is not dysarthric.  CRANIAL NERVES:  Pupils equal round and reactive to light.  There is mild right ptosis, worse with sustained up gaze.  Extraocular muscles are conjugate and intact bilaterally.  Facial muscles show mild weakness of the orbicularis oris, orbicularis oculi, and buccinator weakness, which is stable.   Tongue is midline and strength is 5/5.  Tongue side-to-side movements are normal.  MOTOR:  Motor strength is 5/5 in all extremities, including neck flexion. There is evidence of mild fatigability with neck flexion, limb strength does not fatigue.  COORDINATION/GAIT: Gait narrow based and stable.  She is easily able to stand up from a low chair without using arms to push off.   Data: Labs 08/22/2017:  AChR binding 7.86*, blocking 73*, modulating 58*,  TSH 0.98, SSA/B neg, ACE 62, ANA neg, HIV neg, CRP 3.3*, ESR 28*  Lab Results  Component Value Date   CREATININE 0.65 07/23/2018   BUN 12 07/23/2018   NA 140 07/23/2018   K 3.7 07/23/2018   CL 100 07/23/2018   CO2 28 07/23/2018   Lab Results  Component Value Date   WBC 14.4 (H) 06/25/2018   HGB 12.2 07/23/2018   HCT 36.0 07/23/2018   MCV 86.5 06/25/2018   PLT 311 06/25/2018    CT head and neck w contrast 08/22/2017:   1. Anterior mediastinal mass, most consistent with thymoma. This supports the clinical concern for myasthenia gravis. 2. No arterial abnormality of the head or neck. 3. No intracranial abnormality.  MRI brain wwo contrast 08/22/2017:   1. Negative brain MRI. No acute intracranial abnormality identified. 2. Partially empty sella.    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  Diagnosed in March 2019 with 3 hospitalizations, no MG crisis requiring intubation.  Due to persistent bulbar weakness, her prednisone was increased to '60mg'$   daily and she was readmitted in September for plasmapheresis.  She completed 5 sessions of plasmapheresis while hospitalized and had resolution of dysarthria and improve dysphasia.   She has been getting weekly PLEX since this time and we have been able to avoid readmission for MG exacerbation. Exam shows improved extraocular eye movements and limb strength.  She continues to have mild right ptosis and facial weakness, especially with smile.   1.  Plasmapheresis scheduled for 2/27 and every 2 weeks thereafter with plans to taper   2.  Soliris has been approved, we are awaiting insurance to determine which facility patient can have infusion.  Infusions will be weekly x 4 weeks and will need to be AFTER plasmapheresis to avoid removal of drug  3.  Continue prednisone to '50mg'$ . Start to taper once Solaris has been started  4.  Continue CellCept 500 mg twice daily   5.  Continue Mestinon 60 mg at 6 AM, 10 AM, 3 PM, and 8 PM.    Long term use of corticosteroids.  Continue calcium and vitamin D supplements Continue pepcid  Continue Bactrim DS Monday Wednesday Friday for PCP prophylaxis.     Return to clinic in 1 month  Greater than 50% of this 25 minute visit was spent in counseling, explanation of diagnosis, planning of further management, and coordination of care.     Thank you for allowing me to participate in patient's care.  If I can answer any additional questions, I would be pleased to do so.    Sincerely,    Cordia Miklos K. Posey Pronto, DO

## 2018-07-23 NOTE — Patient Instructions (Addendum)
We will reschedule plasmapheresis to 2/27 and then every 2 weeks  Reduce mestinon to 60mg  three times daily.  Take extra dose only as needed  Return to clinic in 1 month

## 2018-07-24 ENCOUNTER — Encounter: Payer: Self-pay | Admitting: *Deleted

## 2018-07-25 ENCOUNTER — Ambulatory Visit: Payer: Managed Care, Other (non HMO) | Admitting: Family Medicine

## 2018-07-29 ENCOUNTER — Telehealth: Payer: Self-pay | Admitting: Neurology

## 2018-07-29 NOTE — Telephone Encounter (Signed)
I spoke with Suzanne Nguyen and let her know that I am waiting for [employer] to call me back to make sure [facility] is an approved site for the infusion.

## 2018-07-29 NOTE — Telephone Encounter (Signed)
Lavernne from short stay called regarding this patient and she needs to speak with you regarding if her Insurance was straightened out? She said the patient had called to set up appointment. Please Call. Thanks

## 2018-07-30 ENCOUNTER — Telehealth: Payer: Self-pay | Admitting: *Deleted

## 2018-07-30 NOTE — Telephone Encounter (Signed)
Patient will be on Spring Break so she will not start the Soliris infusion until March 12.   This week's infusion has been cancelled.

## 2018-08-01 ENCOUNTER — Telehealth: Payer: Self-pay | Admitting: Neurology

## 2018-08-01 ENCOUNTER — Non-Acute Institutional Stay (HOSPITAL_COMMUNITY)
Admission: AD | Admit: 2018-08-01 | Discharge: 2018-08-01 | Disposition: A | Discharge status: Home / Self Care | Payer: Managed Care, Other (non HMO) | Source: Ambulatory Visit | Attending: Neurology | Admitting: Neurology

## 2018-08-01 DIAGNOSIS — G7001 Myasthenia gravis with (acute) exacerbation: Secondary | ICD-10-CM | POA: Diagnosis present

## 2018-08-01 LAB — POCT I-STAT EG7
Acid-Base Excess: 1 mmol/L (ref 0.0–2.0)
Bicarbonate: 27.2 mmol/L (ref 20.0–28.0)
Calcium, Ion: 1.2 mmol/L (ref 1.15–1.40)
HCT: 36 % (ref 36.0–46.0)
Hemoglobin: 12.2 g/dL (ref 12.0–15.0)
O2 Saturation: 66 %
PO2 VEN: 36 mmHg (ref 32.0–45.0)
Potassium: 3.7 mmol/L (ref 3.5–5.1)
Sodium: 141 mmol/L (ref 135–145)
TCO2: 29 mmol/L (ref 22–32)
pCO2, Ven: 47.7 mmHg (ref 44.0–60.0)
pH, Ven: 7.365 (ref 7.250–7.430)

## 2018-08-01 MED ORDER — ACETAMINOPHEN 325 MG PO TABS: 650.0000 mg | ORAL_TABLET | ORAL | Status: DC | PRN

## 2018-08-01 MED ORDER — ACD FORMULA A 0.73-2.45-2.2 GM/100ML VI SOLN
500.0000 mL | Status: DC
  Administered 2018-08-01: 500 mL via INTRAVENOUS

## 2018-08-01 MED ORDER — SODIUM CHLORIDE 0.9 % IV SOLN
2.0000 g | Freq: Once | INTRAVENOUS | Status: AC
  Administered 2018-08-01: 2 g via INTRAVENOUS
  Filled 2018-08-01: qty 20

## 2018-08-01 MED ORDER — SODIUM CHLORIDE 0.9 % IV SOLN
INTRAVENOUS | Status: AC
  Administered 2018-08-01 (×3): via INTRAVENOUS_CENTRAL
  Filled 2018-08-01 (×3): qty 200

## 2018-08-01 MED ORDER — ACD FORMULA A 0.73-2.45-2.2 GM/100ML VI SOLN
Status: AC
  Filled 2018-08-01: qty 500

## 2018-08-01 MED ORDER — DIPHENHYDRAMINE HCL 25 MG PO CAPS: 25.0000 mg | ORAL_CAPSULE | Freq: Four times a day (QID) | ORAL | Status: DC | PRN

## 2018-08-01 MED ORDER — HEPARIN SODIUM (PORCINE) 1000 UNIT/ML IJ SOLN
3200.0000 [IU] | Freq: Once | INTRAMUSCULAR | Status: AC
  Administered 2018-08-01: 3200 [IU]

## 2018-08-01 MED ORDER — HEPARIN SODIUM (PORCINE) 1000 UNIT/ML IJ SOLN
INTRAMUSCULAR | Status: AC
  Filled 2018-08-01: qty 4

## 2018-08-01 MED ORDER — CALCIUM CARBONATE ANTACID 500 MG PO CHEW
CHEWABLE_TABLET | ORAL | Status: AC
  Administered 2018-08-01: 400 mg
  Filled 2018-08-01: qty 2

## 2018-08-01 NOTE — Telephone Encounter (Signed)
Need to wait 2 months from the date of her first immunization, please ask them when her first series was.  Thanks.

## 2018-08-01 NOTE — Telephone Encounter (Signed)
Received a call from [facility] with [facility]. [phone] opt 2 (pharmacy). She wants to know if patient can have vaccine (baxsero/menveo [meningococcal vaccines]) a week earlier than scheduled. She asked that we call her back today or call tomorrow and ask for Cristie Hem to let them know.  Dr. Posey Pronto - please advise.

## 2018-08-01 NOTE — Telephone Encounter (Signed)
Left message on machine for Suzanne Nguyen to call back to let me know the date of immunization so that Dr. Posey Pronto can decide if okay to proceed early. Awaiting call back.

## 2018-08-01 NOTE — Telephone Encounter (Signed)
Received approval for plasma exchange from [employer] valid 08/01/2018-10/30/2018. Verdene Lennert, Midwife at infusion center [phone]), made aware. Message sent to patient.

## 2018-08-02 ENCOUNTER — Encounter (HOSPITAL_COMMUNITY): Payer: Managed Care, Other (non HMO)

## 2018-08-02 ENCOUNTER — Inpatient Hospital Stay (HOSPITAL_COMMUNITY)
Admission: RE | Admit: 2018-08-02 | Payer: Managed Care, Other (non HMO) | Source: Ambulatory Visit | Attending: Nephrology | Admitting: Nephrology

## 2018-08-02 ENCOUNTER — Telehealth: Payer: Self-pay | Admitting: Neurology

## 2018-08-02 NOTE — Telephone Encounter (Signed)
April called regarding this patient and needing you to call her please. Thanks

## 2018-08-02 NOTE — Telephone Encounter (Signed)
Spoke with Suzanne Nguyen at [facility]. Her last immunization was June 11, 2018. He was made aware it has to be 2 months from her previous immunization, so I made him aware doing this one week early was not approved. It will need to be after 08/10/2018. He will make the patient aware.  Dr. Posey Pronto - FYI.

## 2018-08-13 ENCOUNTER — Other Ambulatory Visit: Payer: Self-pay

## 2018-08-13 ENCOUNTER — Non-Acute Institutional Stay (HOSPITAL_COMMUNITY)
Admission: AD | Admit: 2018-08-13 | Discharge: 2018-08-13 | Disposition: A | Discharge status: Home / Self Care | Payer: 59 | Source: Other Acute Inpatient Hospital | Attending: Neurology | Admitting: Neurology

## 2018-08-13 DIAGNOSIS — G7001 Myasthenia gravis with (acute) exacerbation: Secondary | ICD-10-CM | POA: Diagnosis not present

## 2018-08-13 LAB — BASIC METABOLIC PANEL
Anion gap: 10 (ref 5–15)
BUN: 7 mg/dL (ref 6–20)
CO2: 28 mmol/L (ref 22–32)
CREATININE: 0.7 mg/dL (ref 0.44–1.00)
Calcium: 9.7 mg/dL (ref 8.9–10.3)
Chloride: 100 mmol/L (ref 98–111)
GFR calc Af Amer: >60 mL/min (ref >60)
GFR calc non Af Amer: >60 mL/min (ref >60)
GLUCOSE: 216 mg/dL — AB (ref 70–99)
Potassium: 4.3 mmol/L (ref 3.5–5.1)
Sodium: 138 mmol/L (ref 135–145)

## 2018-08-13 LAB — POCT I-STAT 7, (LYTES, BLD GAS, ICA,H+H)
Acid-Base Excess: 5 mmol/L — ABNORMAL HIGH (ref 0.0–2.0)
BICARBONATE: 31.3 mmol/L — AB (ref 20.0–28.0)
Calcium, Ion: 1.28 mmol/L (ref 1.15–1.40)
HCT: 34 % — ABNORMAL LOW (ref 36.0–46.0)
Hemoglobin: 11.6 g/dL — ABNORMAL LOW (ref 12.0–15.0)
O2 Saturation: 66 %
PO2 ART: 36 mmHg — AB (ref 83.0–108.0)
Potassium: 4.2 mmol/L (ref 3.5–5.1)
Sodium: 137 mmol/L (ref 135–145)
TCO2: 33 mmol/L — ABNORMAL HIGH (ref 22–32)
pCO2 arterial: 54.9 mmHg — ABNORMAL HIGH (ref 32.0–48.0)
pH, Arterial: 7.364 (ref 7.350–7.450)

## 2018-08-13 MED ORDER — SODIUM CHLORIDE 0.9 % IV SOLN
INTRAVENOUS | Status: AC
  Administered 2018-08-13 (×3): via INTRAVENOUS_CENTRAL
  Filled 2018-08-13 (×3): qty 200

## 2018-08-13 MED ORDER — ACD FORMULA A 0.73-2.45-2.2 GM/100ML VI SOLN
Status: AC
  Filled 2018-08-13: qty 500

## 2018-08-13 MED ORDER — CALCIUM CARBONATE ANTACID 500 MG PO CHEW
2.0000 | CHEWABLE_TABLET | ORAL | Status: DC
  Administered 2018-08-13: 400 mg via ORAL

## 2018-08-13 MED ORDER — DIPHENHYDRAMINE HCL 25 MG PO CAPS: 25.0000 mg | ORAL_CAPSULE | Freq: Four times a day (QID) | ORAL | Status: DC | PRN

## 2018-08-13 MED ORDER — CALCIUM CARBONATE ANTACID 500 MG PO CHEW
CHEWABLE_TABLET | ORAL | Status: AC
  Filled 2018-08-13: qty 2

## 2018-08-13 MED ORDER — ACD FORMULA A 0.73-2.45-2.2 GM/100ML VI SOLN: 500.0000 mL | Status: DC

## 2018-08-13 MED ORDER — HEPARIN SODIUM (PORCINE) 1000 UNIT/ML IJ SOLN
1000.0000 [IU] | Freq: Once | INTRAMUSCULAR | Status: AC
  Administered 2018-08-13: 1000 [IU]

## 2018-08-13 MED ORDER — SODIUM CHLORIDE 0.9 % IV SOLN
2.0000 g | Freq: Once | INTRAVENOUS | Status: DC
  Filled 2018-08-13: qty 20

## 2018-08-13 MED ORDER — HEPARIN SODIUM (PORCINE) 1000 UNIT/ML IJ SOLN
INTRAMUSCULAR | Status: AC
  Filled 2018-08-13: qty 4

## 2018-08-13 MED ORDER — ACETAMINOPHEN 325 MG PO TABS: 650.0000 mg | ORAL_TABLET | ORAL | Status: DC | PRN

## 2018-08-13 NOTE — Progress Notes (Signed)
Tolerated Pheresis treatment with no issues, stable at discharge, Discharged home

## 2018-08-14 ENCOUNTER — Telehealth: Payer: Self-pay | Admitting: Neurology

## 2018-08-14 NOTE — Telephone Encounter (Signed)
I spoke with [place] and gave v/o for flushes and ana kit.

## 2018-08-14 NOTE — Telephone Encounter (Signed)
[facility] [place] called regarding this patient and needing a new order. Please Call. Thanks

## 2018-08-15 ENCOUNTER — Inpatient Hospital Stay (HOSPITAL_COMMUNITY): Admission: RE | Admit: 2018-08-15 | Payer: Managed Care, Other (non HMO) | Source: Ambulatory Visit

## 2018-08-15 ENCOUNTER — Encounter (HOSPITAL_COMMUNITY): Payer: Managed Care, Other (non HMO)

## 2018-08-22 ENCOUNTER — Ambulatory Visit: Payer: 59 | Admitting: Neurology

## 2018-08-22 ENCOUNTER — Other Ambulatory Visit: Payer: Self-pay

## 2018-08-22 ENCOUNTER — Encounter (HOSPITAL_COMMUNITY): Payer: Self-pay

## 2018-08-22 ENCOUNTER — Encounter: Payer: Self-pay | Admitting: Neurology

## 2018-08-22 VITALS — BP 110/80 | HR 98 | Temp 98.2°F | Ht 65.0 in | Wt 208.4 lb

## 2018-08-22 DIAGNOSIS — G7001 Myasthenia gravis with (acute) exacerbation: Secondary | ICD-10-CM | POA: Diagnosis not present

## 2018-08-22 MED ORDER — PREDNISONE 20 MG PO TABS: 50.0000 mg | ORAL_TABLET | Freq: Every day | ORAL | 0 refills | Status: AC

## 2018-08-22 MED ORDER — PYRIDOSTIGMINE BROMIDE 60 MG PO TABS: ORAL_TABLET | ORAL | 0 refills | Status: DC

## 2018-08-22 MED ORDER — SULFAMETHOXAZOLE-TRIMETHOPRIM 800-160 MG PO TABS: 1.0000 | ORAL_TABLET | ORAL | 0 refills | Status: DC

## 2018-08-22 MED ORDER — MYCOPHENOLATE MOFETIL 500 MG PO TABS: 500.0000 mg | ORAL_TABLET | Freq: Two times a day (BID) | ORAL | 0 refills | Status: AC

## 2018-08-22 NOTE — Patient Instructions (Signed)
Continue Solaris at home - [employer] and let them know your situation  OK to hold off on further plasmapheresis for now, given dramatic improvement with Solaris.    Continue prednisone 50mg  daily with plan to slowly taper  Continue Cellcept 500mg  BID  Reduce mestinon 60mg  three times daily at 8A, 1P, 6P.  OK to take extra dose as needed  Follow-up with Dr. Thomasene Lot for ongoing management  It has been a joy taking care of you.  All the best!  Glad we got your smile back.

## 2018-08-22 NOTE — Progress Notes (Signed)
Follow-up Visit   Date: 08/22/18    Suzanne Nguyen MRN: [id number] DOB: 1995-09-19   Interim History: Suzanne Nguyen is a 23 y.o. right-handed African American female with PCOS with refractory seropositive myasthenia gravis s/p thymectomy returning to the clinic for follow-up of myasthenia gravis.  The patient was accompanied to the clinic by self.  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  Over the next few days, she started getting tired easily and noticed difficulty with chewing, double vision, and droopiness of the right eye.  She was hospitalized at [facility] 3/19 - 08/24/2017 where MRI brain and CT A neck was unremarkable.  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  She was started on mestinon 29m TID and prednisone 170m  She takes mestinon 3019mt 7am, 3pm, and 10pm which has improved her ability to open her eyes.  She establish care with me in early April, at which time I started prednisone 30 mg which did not have marked benefit.  IVIG was started and improved her bulbar weakness.  She was continued on monthly IVIG over the summer when she returned home and [facility] She underwent robotic assisted thymectomy on 12/18/2017 which required taking the left phrenic nerve.  Following surgery, she received IVIG, increased prednisone to 7m36mily and then again admitted for plasmapheresis, which was completed in early August.  Her prednisone was increased to 60mg67mly and she was started on Cellcept 500mg 2min September.   She was hospitalized from 9/13 - 02/25/2018 with myasthenia exacerbation due to progressive dysphagia and double vision.  She had 5 cycles of PLEX which significantly helped control her  weakness.  She has been continued on an extended out-patient course of twice per week for 3 weeks, then continued with weekly sessions.     UPDATE 07/23/2018:  She continues to be on prednisone 50mg/d51mllcept 500mg BI49meekly PLEX, and mestinon 60mg 7am67mon, 4pm, and 8pm.  She has noticed that her strength and overall fatigue is better.  Eyes are staying open longer and she has less double vision.  Unfortunately, she still can't raise her cheeks when smiling.  She has mild dysarthria after prolonged talking.  She is now approved for Soliris and she is waiting to hear back from her insurance where she can get infusion.  Her plasmapheresis will need to be scheduled around [facility]  She is here for 1 month follow-up visit.  She received Solaris last week (3/12) and again this morning which she is tolerating very well.  Last PLEX was 3/10.  She began noticing improved facial weakness within a few days of her initial dose.  In fact, she feels that her smile is almost back to normal!  She has more energy and is able to deep clean her apartment without difficulty in preparation to return home to [place] due to college closing.   She denies double vision, droopy eyelids, difficulty swallowing/talking, jaw fatigue, or limb weakness.    Medications:  Current Outpatient Medications on File Prior to Visit  Medication Sig Dispense Refill   CALCIUM PO Take 600 mg by mouth 2 (two) times daily. With vitamin D3 800 units     cholecalciferol (VITAMIN D)  1000 units tablet Take 1,000 Units by mouth daily.     ibuprofen (ADVIL,MOTRIN) 200 MG tablet Take 200 mg by mouth every 6 (six) hours as needed for mild pain.     Multiple Vitamin (MULTIVITAMIN) tablet Take 1 tablet by mouth daily.     No current facility-administered medications on file prior to visit.     Allergies:  Allergies  Allergen Reactions   Magnesium-Containing Compounds Anaphylaxis    paralysis   Corticosteroids     Worsen  symptoms of MYASTHENIA GRAVES   Other Other (See Comments)    Mycins   Procainamide     Irregular Heart beat   Soy Allergy Itching    Review of Systems:  CONSTITUTIONAL: No fevers, chills, night sweats, or weight loss.  EYES: +visual changes or eye pain ENT: No hearing changes.  No history of nose bleeds.   RESPIRATORY: No cough, wheezing and shortness of breath.   CARDIOVASCULAR: Negative for chest pain, and palpitations.   GI: No for abdominal discomfort, blood in stools or black stools.  No recent change in bowel habits.   GU:  No history of incontinence.   MUSCLOSKELETAL: No history of joint pain or swelling.  No myalgias.   SKIN: Negative for lesions, rash, and itching.   ENDOCRINE: Negative for cold or heat intolerance, polydipsia or goiter.   PSYCH:  No depression +anxiety symptoms.   NEURO: As Above.   Vital Signs:  BP 110/80    Pulse 98    Temp 98.2 F (36.8 C) (Oral)    Ht _0  (1.651 m)    Wt 208 lb 6 oz (94.5 kg)    SpO2 97%    BMI 34.68 kg/m     General Medical Exam:   General:  Well appearing, comfortable  Eyes/ENT: see cranial nerve examination.   Neck:   No carotid bruits. Respiratory:  Clear to auscultation, good air entry bilaterally.   Cardiac:  Regular rate and rhythm, no murmur.   Ext:  No edema  Neurological Exam: MENTAL STATUS including orientation to time, place, person, recent and remote memory, attention span and concentration, language, and fund of knowledge is normal.  Speech is not dysarthric.  CRANIAL NERVES:  Pupils equal round and reactive to light.  There is no ptosis at rest or with sustained upgaze (improved).  Extraocular muscles are conjugate and intact bilaterally.  Facial muscles show intact orbicularis oris and orbicularis oculi.  She has mild weakness with maintaining air in cheeks.  Smile is improved, no longer transverse.  Tongue is midline and strength is 5/5.  Tongue side-to-side movements are normal.  MOTOR:  Motor strength is  5/5 in all extremities, including neck flexion. No fatigability.   COORDINATION/GAIT: Gait narrow based and stable.  She is easily able to stand up from a low chair without using arms to push off.   Data: Labs 08/22/2017:  AChR binding 7.86*, blocking 73*, modulating 58*, TSH 0.98, SSA/B neg, ACE 62, ANA neg, HIV neg, CRP 3.3*, ESR 28*  Lab Results  Component Value Date   CREATININE 0.70 08/13/2018   BUN 7 08/13/2018   NA 137 08/13/2018   K 4.2 08/13/2018   CL 100 08/13/2018   CO2 28 08/13/2018   Lab Results  Component Value Date   WBC 14.4 (H) 06/25/2018   HGB 11.6 (L) 08/13/2018   HCT 34.0 (L) 08/13/2018   MCV 86.5 06/25/2018   PLT 311 06/25/2018    CT head and neck w contrast  08/22/2017:   1. Anterior mediastinal mass, most consistent with thymoma. This supports the clinical concern for myasthenia gravis. 2. No arterial abnormality of the head or neck. 3. No intracranial abnormality.  MRI brain wwo contrast 08/22/2017:   1. Negative brain MRI. No acute intracranial abnormality identified. 2. Partially empty sella.    IMPRESSION/PLAN: 1.  Refractory seropositive generalized myasthenia gravis s/p thymectomy (diagnosed March 2019) without acute exacerbation.  She has been hospitalized 3 times with MG exacerbation/near crisis, but has not required intubation.  She has been very refractory to medications and managed with intermittent plasmapheresis, Cellcept 517m BID (September 2019), and high dose corticosteroids (prednisone 50-646md).   Starting in March 2020, she began Solaris and within her first treatment has noticed marked benefit.  She no longer has droopiness of the eyelids, smile is symmetric, and there is no limb weakness.   Due to COVID-19, her college is closed and she will be returning home to [place] 3/20.  She will resume Solaris at home and we will contact her insurance to help coordinate local administration. OK to hold off on further plasmapheresis  for now, given dramatic improvement with Solaris. She will eventually need to have her central line removed. Continue prednisone 5088maily with plan to slowly taper Continue Cellcept 500m4mD Reduce mestinon 60mg23mee times daily at 8A, 1P, 6P.  OK to take extra dose as needed  2.  Long term use of corticosteroids.  Continue calcium and vitamin D supplements Continue pepcid  Continue Bactrim DS MWF for PCP prophylaxis.     She [facility] for Suzanne Nguyen and I am very pleased to see that we are finally able to get her disease under control.  Going forward, the goal will be tapering her prednisone and consolidating her immunosuppressant regimen.    Greater than 50% of this 30 minute visit was spent in counseling, explanation of diagnosis, planning of further management, and coordination of care.   Thank you for allowing me to participate in patient's care.  If I can answer any additional questions, I would be pleased to do so.    Sincerely,    Trinh Sanjose K. PatelPosey Pronto

## 2018-08-28 ENCOUNTER — Ambulatory Visit: Payer: Managed Care, Other (non HMO) | Admitting: Neurology

## 2018-08-28 ENCOUNTER — Ambulatory Visit: Payer: Managed Care, Other (non HMO) | Admitting: Family Medicine

## 2018-08-29 ENCOUNTER — Ambulatory Visit: Payer: Managed Care, Other (non HMO) | Admitting: Family Medicine

## 2018-10-01 ENCOUNTER — Telehealth: Payer: Self-pay | Admitting: Neurology

## 2018-10-01 NOTE — Telephone Encounter (Signed)
Called Suzanne Nguyen x 2 but call could not be completed.  Will try again.

## 2018-10-01 NOTE — Telephone Encounter (Signed)
[facility] called regarding this patient and her medications. That company only has orders for them to supply Solaris but patients Catheter requires Heparin  (Flush). Patient is wanting to know if Dr. Posey Pronto would like to give them orders to supply this as well? Thanks

## 2018-10-01 NOTE — Telephone Encounter (Signed)
That would be fine, but please inform [facility] that pt is now being followed by Dr. Junious Silk at [facility] and future orders will need to go through his office.

## 2018-10-01 NOTE — Telephone Encounter (Signed)
Suzanne Nguyen [phone]/ Thanks

## 2018-10-01 NOTE — Telephone Encounter (Signed)
Please advise 

## 2018-10-02 NOTE — Telephone Encounter (Signed)
Attempted to contact Minette Brine again but call would not go through.

## 2018-10-02 NOTE — Telephone Encounter (Signed)
Attempted to contact [place] again.  Call will not go through.

## 2018-10-03 ENCOUNTER — Other Ambulatory Visit: Payer: Self-pay

## 2018-10-03 ENCOUNTER — Encounter: Payer: Self-pay | Admitting: *Deleted

## 2018-10-03 ENCOUNTER — Telehealth (INDEPENDENT_AMBULATORY_CARE_PROVIDER_SITE_OTHER): Payer: Managed Care, Other (non HMO) | Admitting: Neurology

## 2018-10-03 DIAGNOSIS — G7000 Myasthenia gravis without (acute) exacerbation: Secondary | ICD-10-CM | POA: Diagnosis not present

## 2018-10-03 DIAGNOSIS — T82594A Other mechanical complication of infusion catheter, initial encounter: Secondary | ICD-10-CM

## 2018-10-03 NOTE — Progress Notes (Signed)
Note faxed.

## 2018-10-03 NOTE — Telephone Encounter (Signed)
See e visit note

## 2018-10-03 NOTE — Progress Notes (Signed)
Virtual Visit via Video Note The purpose of this virtual visit is to provide medical care while limiting exposure to the novel coronavirus.    Consent was obtained for video visit:  Yes.   Answered questions that patient had about telehealth interaction:  Yes.   I discussed the limitations, risks, security and privacy concerns of performing an evaluation and management service by telemedicine. I also discussed with the patient that there may be a patient responsible charge related to this service. The patient expressed understanding and agreed to proceed.  Pt location: Home Physician Location: office Name of referring provider:  Antony Blackbird, MD I connected with Janira Bias-Johnson at patients initiation/request on 10/03/2018 at 11:30 AM EDT by video enabled telemedicine application and verified that I am speaking with the correct person using two identifiers. Pt MRN:  [id number] Pt DOB:  [birth year] Video Participants:  Benson [place] Bias-Johnson   History of Present Illness: This is a 23 y.o. female returning for follow-up of seropositive myasthenia gravis.  She was last seen in the office on March 19th.  She returned home to [place] after her college closed due to coronavirus and has been getting Solaris infusions through [employer].  She remains on prednisone 50mg /d and mestinon 60mg  three times daily.  She has been doing well and noticing intermittent droopiness of the eyelids only during menstruation.  Otherwise, no double vision, difficulty swallowing, or limb weakness.  She is scheduled to see Dr. Wonda Horner on May 7th for ongoing management.    I was called by her nurse from [place] due to concern that her tunneled apheresis catheter was not getting regular maintenance, as she has not required any additional plasmapheresis.  Patient has noticed that over the past 3 weeks, there is a dark blue discoloration to the catheter lumen.  There is no pain or swelling.  She has not had the line used since  her last plasmapheresis here on March 10th.     Observations/Objective:   Patient is awake, alert, and appears comfortable.  Oriented x 4.   Extraocular muscles are intact. No ptosis at rest or with sustained upgaze.  Face is symmetric.  Smile is symmetric and not flat. Speech is not dysarthric. Antigravity in all extremities.  She is able to stand up without using arms to push off.  No pronator drift. Gait appears normal. Catheter line shows dark blood products in the lumen.     Assessment and Plan:  1.  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  She is currently not requiring use of tunneled catheter and best to have it removed safely.  Unfortunately, she is no longer here in [place], where the line was originally inserted; therefore, I have asked her to call Dr. Ronni Rumble office and see if they can coordinate line removal through interventional radiology.  If there is delay in doing this, I urged her to go directly to the ER for evaluation and management. I have spoken to Fredna Dow [facility] nurse) and Alean Rinne ([place]) about her care coordination and do not think it is safe to administer heparin flush due to high likelihood of line-related thrombus and risk of clot propagation, if this is not performed safely.  2.  Refractory myasthenia gravis s/p thymectomy.  Clinically, she is doing great on Solaris and is tolerating this well.  She remains on high dose prednisone 50mg  which will need to be tapered safely when she can be closely monitored.  She currently has an appointment next week with Dr. Thomasene Lot,  unless she can ben seen sooner.    Follow Up Instructions:   I discussed the assessment and treatment plan with the patient. The patient was provided an opportunity to ask questions and all were answered. The patient agreed with the plan and demonstrated an understanding of the instructions.   The patient was advised to call back or seek an in-person evaluation if  the symptoms worsen or if the condition fails to improve as anticipated.  Total time spent:  45 minutes     Alda Berthold, DO

## 2018-10-04 ENCOUNTER — Telehealth: Payer: Self-pay | Admitting: Neurology

## 2018-10-04 NOTE — Telephone Encounter (Signed)
Patient left VM with after hours stating that she talked to Dr. Loel Dubonnet and agreed to removing catheter. Thanks!

## 2018-10-04 NOTE — Telephone Encounter (Signed)
FYI

## 2018-11-11 DIAGNOSIS — T82594A Other mechanical complication of infusion catheter, initial encounter: Secondary | ICD-10-CM

## 2018-11-12 ENCOUNTER — Other Ambulatory Visit: Payer: Self-pay

## 2018-11-12 DIAGNOSIS — T82594A Other mechanical complication of infusion catheter, initial encounter: Secondary | ICD-10-CM

## 2018-11-14 ENCOUNTER — Telehealth: Payer: Self-pay

## 2018-11-14 NOTE — Telephone Encounter (Signed)
Spoke with interventional radiology and scheduled port removal but patient could not make it.  Patient given information and will schedule appointment accordingly.

## 2018-11-15 ENCOUNTER — Ambulatory Visit (HOSPITAL_COMMUNITY): Admission: RE | Admit: 2018-11-15 | Payer: Managed Care, Other (non HMO) | Source: Ambulatory Visit

## 2018-11-21 ENCOUNTER — Other Ambulatory Visit: Payer: Self-pay | Admitting: Radiology

## 2018-11-25 ENCOUNTER — Encounter (HOSPITAL_COMMUNITY): Payer: Self-pay | Admitting: Student

## 2018-11-25 ENCOUNTER — Ambulatory Visit (HOSPITAL_COMMUNITY)
Admission: RE | Admit: 2018-11-25 | Discharge: 2018-11-25 | Disposition: A | Discharge status: Home / Self Care | Payer: Managed Care, Other (non HMO) | Source: Ambulatory Visit | Attending: Neurology | Admitting: Neurology

## 2018-11-25 ENCOUNTER — Other Ambulatory Visit: Payer: Self-pay | Admitting: Neurology

## 2018-11-25 ENCOUNTER — Other Ambulatory Visit: Payer: Self-pay

## 2018-11-25 DIAGNOSIS — T82594A Other mechanical complication of infusion catheter, initial encounter: Secondary | ICD-10-CM

## 2018-11-25 DIAGNOSIS — Z452 Encounter for adjustment and management of vascular access device: Secondary | ICD-10-CM | POA: Insufficient documentation

## 2018-11-25 HISTORY — PX: IR REMOVAL TUN CV CATH W/O FL: IMG2289

## 2018-11-25 LAB — CBC
HCT: 39.6 % (ref 36.0–46.0)
Hemoglobin: 11.4 g/dL — ABNORMAL LOW (ref 12.0–15.0)
MCH: 23.2 pg — ABNORMAL LOW (ref 26.0–34.0)
MCHC: 28.8 g/dL — ABNORMAL LOW (ref 30.0–36.0)
MCV: 80.7 fL (ref 80.0–100.0)
Platelets: 294 K/uL (ref 150–400)
RBC: 4.91 MIL/uL (ref 3.87–5.11)
RDW: 16.6 % — ABNORMAL HIGH (ref 11.5–15.5)
WBC: 15.2 K/uL — ABNORMAL HIGH (ref 4.0–10.5)
nRBC: 0 % (ref 0.0–0.2)

## 2018-11-25 MED ORDER — SODIUM CHLORIDE 0.9 % IV SOLN: INTRAVENOUS | Status: DC

## 2018-11-25 MED ORDER — LIDOCAINE HCL 1 % IJ SOLN
INTRAMUSCULAR | Status: DC | PRN
  Administered 2018-11-25: 8 mL

## 2018-11-25 MED ORDER — CEFAZOLIN SODIUM-DEXTROSE 2-4 GM/100ML-% IV SOLN: 2.0000 g | INTRAVENOUS | Status: DC

## 2018-11-25 MED ORDER — LIDOCAINE HCL 1 % IJ SOLN
INTRAMUSCULAR | Status: AC
  Filled 2018-11-25: qty 20

## 2018-11-25 MED ORDER — CHLORHEXIDINE GLUCONATE 4 % EX LIQD
CUTANEOUS | Status: AC
  Filled 2018-11-25: qty 15

## 2018-11-25 NOTE — Discharge Instructions (Addendum)
Implanted Port Removal, Care After °This sheet gives you information about how to care for yourself after your procedure. Your health care provider may also give you more specific instructions. If you have problems or questions, contact your health care provider. °What can I expect after the procedure? °After the procedure, it is common to have: °· Soreness or pain near your incision. °· Some swelling or bruising near your incision. °Follow these instructions at home: °Medicines °· Take over-the-counter and prescription medicines only as told by your health care provider. °· If you were prescribed an antibiotic medicine, take it as told by your health care provider. Do not stop taking the antibiotic even if you start to feel better. °Bathing °· Do not take baths, swim, or use a hot tub until your health care provider approves. Ask your health care provider if you can take showers. You may only be allowed to take sponge baths. °Incision care ° °· Follow instructions from your health care provider about how to take care of your incision. Make sure you: °? Wash your hands with soap and water before you change your bandage (dressing). If soap and water are not available, use hand sanitizer. °? Change your dressing as told by your health care provider. °? Keep your dressing dry. °? Leave stitches (sutures), skin glue, or adhesive strips in place. These skin closures may need to stay in place for 2 weeks or longer. If adhesive strip edges start to loosen and curl up, you may trim the loose edges. Do not remove adhesive strips completely unless your health care provider tells you to do that. °· Check your incision area every day for signs of infection. Check for: °? More redness, swelling, or pain. °? More fluid or blood. °? Warmth. °? Pus or a bad smell. °Driving ° °· Do not drive for 24 hours if you were given a medicine to help you relax (sedative) during your procedure. °· If you did not receive a sedative, ask your  health care provider when it is safe to drive. °Activity °· Return to your normal activities as told by your health care provider. Ask your health care provider what activities are safe for you. °· Do not lift anything that is heavier than 10 lb (4.5 kg), or the limit that you are told, until your health care provider says that it is safe. °· Do not do activities that involve lifting your arms over your head. °General instructions °· Do not use any products that contain nicotine or tobacco, such as cigarettes and e-cigarettes. These can delay healing. If you need help quitting, ask your health care provider. °· Keep all follow-up visits as told by your health care provider. This is important. °Contact a health care provider if: °· You have more redness, swelling, or pain around your incision. °· You have more fluid or blood coming from your incision. °· Your incision feels warm to the touch. °· You have pus or a bad smell coming from your incision. °· You have pain that is not relieved by your pain medicine. °Get help right away if you have: °· A fever or chills. °· Chest pain. °· Difficulty breathing. °Summary °· After the procedure, it is common to have pain, soreness, swelling, or bruising near your incision. °· If you were prescribed an antibiotic medicine, take it as told by your health care provider. Do not stop taking the antibiotic even if you start to feel better. °· Do not drive for 24 hours   if you were given a sedative during your procedure. °· Return to your normal activities as told by your health care provider. Ask your health care provider what activities are safe for you. °This information is not intended to replace advice given to you by your health care provider. Make sure you discuss any questions you have with your health care provider. °Document Released: 05/03/2015 Document Revised: 07/05/2017 Document Reviewed: 07/05/2017 °Elsevier Interactive Patient Education © 2019 Elsevier  Inc. ° ° °Moderate Conscious Sedation, Adult, Care After °These instructions provide you with information about caring for yourself after your procedure. Your health care provider may also give you more specific instructions. Your treatment has been planned according to current medical practices, but problems sometimes occur. Call your health care provider if you have any problems or questions after your procedure. °What can I expect after the procedure? °After your procedure, it is common: °· To feel sleepy for several hours. °· To feel clumsy and have poor balance for several hours. °· To have poor judgment for several hours. °· To vomit if you eat too soon. °Follow these instructions at home: °For at least 24 hours after the procedure: ° °· Do not: °? Participate in activities where you could fall or become injured. °? Drive. °? Use heavy machinery. °? Drink alcohol. °? Take sleeping pills or medicines that cause drowsiness. °? Make important decisions or sign legal documents. °? Take care of children on your own. °· Rest. °Eating and drinking °· Follow the diet recommended by your health care provider. °· If you vomit: °? Drink water, juice, or soup when you can drink without vomiting. °? Make sure you have little or no nausea before eating solid foods. °General instructions °· Have a responsible adult stay with you until you are awake and alert. °· Take over-the-counter and prescription medicines only as told by your health care provider. °· If you smoke, do not smoke without supervision. °· Keep all follow-up visits as told by your health care provider. This is important. °Contact a health care provider if: °· You keep feeling nauseous or you keep vomiting. °· You feel light-headed. °· You develop a rash. °· You have a fever. °Get help right away if: °· You have trouble breathing. °This information is not intended to replace advice given to you by your health care provider. Make sure you discuss any questions  you have with your health care provider. °Document Released: 03/12/2013 Document Revised: 10/25/2015 Document Reviewed: 09/11/2015 °Elsevier Interactive Patient Education © 2019 Elsevier Inc. ° °

## 2019-06-13 ENCOUNTER — Other Ambulatory Visit: Payer: Self-pay

## 2019-06-13 ENCOUNTER — Telehealth: Payer: Self-pay | Admitting: Neurology

## 2019-06-13 NOTE — Telephone Encounter (Signed)
Patient is no longer followed here as she moved back home to [place].  Please let [facility] know to contact her new neurologist, Dr. Junious Silk at [facility]. Thanks.

## 2019-06-13 NOTE — Telephone Encounter (Signed)
I could not find Solars on her medication list.

## 2019-06-13 NOTE — Telephone Encounter (Signed)
New prescription for the Sellaris medication to the [facility] because of the new insurance. Thanks!

## 2019-06-16 ENCOUNTER — Telehealth: Payer: Self-pay

## 2019-06-16 NOTE — Telephone Encounter (Signed)
Called [facility] and gave them pt's new MD information at [facility]

## 2019-06-18 ENCOUNTER — Telehealth: Payer: Self-pay | Admitting: Neurology

## 2019-06-18 NOTE — Telephone Encounter (Signed)
Can you give any insite to her last vaccine that was given in March, I do not see it. Any thoughts, I am to call patient back tomorrow. Thanks

## 2019-06-18 NOTE — Telephone Encounter (Signed)
Patient called and requested a call from the nurse. [place] and needs some information about infusions she was getting under guidance from Dr. Posey Pronto.   She said she abruptly stopped a medication and needs some information as soon as possible.

## 2019-06-19 NOTE — Telephone Encounter (Signed)
I contacted patient and advised of Dr.Patels notes and she will follow with Dr. Thomasene Lot

## 2019-06-19 NOTE — Telephone Encounter (Signed)
Please let her know that her Solaris should be continued by Dr. Thomasene Lot, her local neurologist.  I have asked [facility] (who was giving her infusions) to contact their office so she can continue to get the medication.  Unfortunately, I am not following her any longer, so she will need to speak with Dr. Ronni Rumble office about needing a prescription for it.

## 2019-07-04 ENCOUNTER — Telehealth: Payer: Self-pay | Admitting: *Deleted

## 2019-07-04 NOTE — Telephone Encounter (Signed)
Faxed document to [facility]--[phone]

## 2019-07-09 ENCOUNTER — Telehealth: Payer: Self-pay | Admitting: Neurology

## 2019-07-09 NOTE — Telephone Encounter (Signed)
Please advise, I believe you worked on this

## 2019-07-09 NOTE — Telephone Encounter (Signed)
After further evaluation it is seen that the patient is no longer followed at this clinic. Called patient and she is aware and will contact her Neurologist is [place].

## 2019-07-09 NOTE — Telephone Encounter (Signed)
The following message was left with [employer] on 07/09/19 at 12:01 PM.  Caller states that she recently had paperwork filled out for an infusion she is on. She states it was denied due to an incorrect dosage listed on the paperwork. She needs to speak to Dr. Serita Grit nurse regarding this matter.

## 2019-11-28 ENCOUNTER — Telehealth: Payer: Self-pay | Admitting: Neurology

## 2019-11-28 NOTE — Telephone Encounter (Signed)
Patient mother called to make appt for patient to see Dr Posey Pronto. She has appt on 03-05-20 but is having problems with her Myosthesis gravis and wants to speak to someone about what is going on   Please call

## 2019-11-28 NOTE — Telephone Encounter (Signed)
Pt is undergoing chemotherapy and radiation, having lots of treatments having lots of mysasthenia gravis issues, I asked to see neurologist in [place]

## 2020-03-05 ENCOUNTER — Other Ambulatory Visit: Payer: Self-pay

## 2020-03-05 ENCOUNTER — Ambulatory Visit (INDEPENDENT_AMBULATORY_CARE_PROVIDER_SITE_OTHER): Payer: BC Managed Care – PPO | Admitting: Neurology

## 2020-03-05 ENCOUNTER — Encounter: Payer: Self-pay | Admitting: Neurology

## 2020-03-05 VITALS — BP 152/89 | HR 129 | Ht 65.0 in | Wt 230.2 lb

## 2020-03-05 DIAGNOSIS — G7000 Myasthenia gravis without (acute) exacerbation: Secondary | ICD-10-CM

## 2020-03-05 NOTE — Patient Instructions (Signed)
It was great to see you!  Get your COVID vaccine

## 2020-03-05 NOTE — Progress Notes (Signed)
Follow-up Visit   Date: 03/05/20   Suzanne Nguyen MRN: [id number] DOB: [birth year]   Interim History: Suzanne Nguyen is a 24 y.o. 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  She continues to be followed by Dr. Thomasene Lot at [facility] and wanted to visit to give me an update.  Her myasthenia gravis was doing quite well towards the end of 2019 where her Solaris was discontinued and she was maintained on prednisone 40mg /d.  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  It was decided to manage this with chemotherapy and radiation.  She was on prednisone 40mg  until January then stopped it in February when she started chemotherapy.  By April, she was getting short of breath and developed droopy eyelids.  She was hospitalized in June and treated and IVIG and prednisone which helped.  She is on prednisone 20mg  daily and restarted Solaris every 2 weeks.  Currently, she is doing much better and denies any double vision, droopy eyelids, dysphagia, jaw fatigue, or limb weakness.   Medications:  Current Outpatient Medications on File Prior to Visit  Medication Sig Dispense Refill  . calcium-vitamin D (OSCAL WITH D) 500-200 MG-UNIT tablet Take 1 tablet by mouth daily with breakfast.    . ibuprofen (ADVIL,MOTRIN) 200 MG tablet Take 200 mg by mouth every 6 (six) hours as needed for headache or mild pain.     . Multiple Vitamin (MULTIVITAMIN) tablet Take 1 tablet by mouth daily.    . mycophenolate (CELLCEPT) 500 MG tablet Take 1 tablet (500 mg total) by mouth 2 (two) times daily. 180 tablet 0  . predniSONE (DELTASONE) 20 MG tablet Take 2.5 tablets (50 mg total) by mouth daily with breakfast. (Patient taking differently: Take 45-50 mg by mouth See admin instructions. Alternate taking 45mg  one day and 50mg  the next.) 225 tablet 0  . pyridostigmine (MESTINON) 60 MG  tablet Take 1 tablet at 8am, 1pm, and 6pm.  OK to take an extra dose as needed. (Patient not taking: Reported on 03/05/2020) 270 tablet 0   No current facility-administered medications on file prior to visit.    Allergies:  Allergies  Allergen Reactions  . Magnesium-Containing Compounds Anaphylaxis    IV magnesium caused paralysis  . Aminoglycosides Other (See Comments)    Told to avoid due to myasthenia gravis  . Macrolides And Ketolides Other (See Comments)    Told to avoid due to myasthenia gravis  . Quinolones Other (See Comments)    Told to avoid due to myasthenia gravis  . Tetracyclines & Related Other (See Comments)    Told to avoid due to myasthenia gravis  . Corticosteroids     Worsen symptoms of MYASTHENIA GRAVES  . Procainamide     Irregular Heart beat  . Soy Allergy Itching    Vital Signs:  BP (!) 152/89 (BP Location: Left Arm, Patient Position: Sitting, Cuff Size: Large)   Pulse (!) 129   Ht 5\' 5"  (1.651 m)   Wt 230 lb 3.2 oz (104.4 kg)   SpO2 93%   BMI 38.31 kg/m    General Medical Exam:   General:  Well appearing, comfortable  Neurological Exam: MENTAL STATUS including orientation to time, place, person, recent and remote memory, attention span and concentration, language, and fund of knowledge is normal.  Speech is not dysarthric.  CRANIAL NERVES:  No visual field defects.  Pupils equal round and reactive to light.  Normal conjugate, extra-ocular eye movements in all directions of gaze.  No ptosis.  Face is symmetric, no facial diplegia. Oribicularis oculi, oribulcaris oris, and buccinator is 5/5  MOTOR:  Motor strength is 5/5 in all extremities, no fatigability.  No atrophy, fasciculations or abnormal movements.  No pronator drift.  Tone is normal.    COORDINATION/GAIT: She is able to stand up without using arms to push off.  Gait narrow based and stable.   Data: n/a  IMPRESSION/PLAN: Seropositive myasthenia gravis s/p thymectomy (2019) with recurrence  (2021) s/p chemotherapy and radiation  - Clinically without exacerbation and doing well.    - She is very steroid responsive and has done well on Solaris as maintenance and responded exceedingly well to PLEX for acute exacerbation  - I agree with her management as planned by Dr. Ronni Rumble team  - Continue prednisone 20mg  and taper as instructed.  Monitor for signs of weakness  - Continue Solaris every 2 weeks  Return to clinic as needed  Total time spent reviewing records, interview, history/exam, counseling and documentation on day of encounter:  30 min    Thank you for allowing me to participate in patient's care.  If I can answer any additional questions, I would be pleased to do so.    Sincerely,    Jerri Glauser K. Posey Pronto, DO

## 2020-03-12 IMAGING — MR MR HEAD WO/W CM
8 of 10 series · 27 of 48 positions shown · IV contrast (multihance)
Comparison: Prior CT from earlier the same day.

CLINICAL DATA: Initial evaluation for acute dizziness, diplopia,
right eyelid lacking, headache.

EXAM:
MRI HEAD WITHOUT AND WITH CONTRAST
TECHNIQUE: Multiplanar, multiecho pulse sequences of the brain and surrounding
structures were obtained without and with intravenous contrast.
CONTRAST:  20mL MULTIHANCE GADOBENATE DIMEGLUMINE 529 MG/ML IV SOLN

[Series 5001: ax dwi_tracew · axial · 3.0mm · 1.44mm/px · z∈[-79,+61]mm · 4 of 80 slices shown]
[im 1/80]
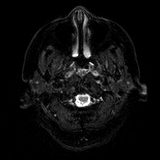
[im 27/80]
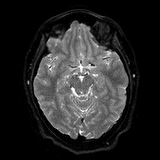
[im 53/80]
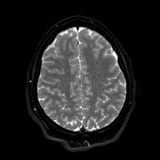
[im 80/80]
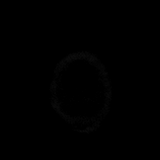

[Series 6001: ax dwi_adc · axial · 3.0mm · 1.44mm/px · 1 of 40 slices shown]
[im 1/40]
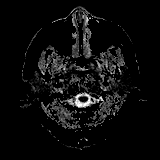

[Series 7001: FLAIR · axial · 3.0mm · 0.45mm/px · z∈[-100,+47]mm · 3 of 42 slices shown]
[im 1/42]
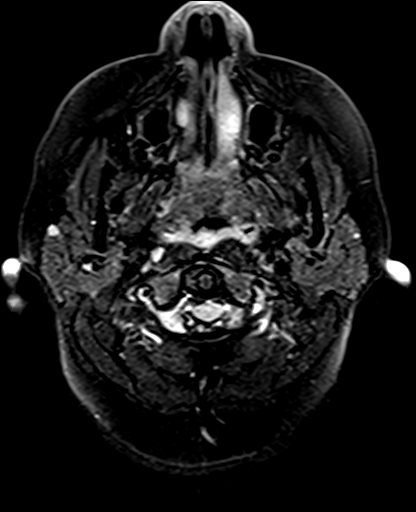
[im 21/42]
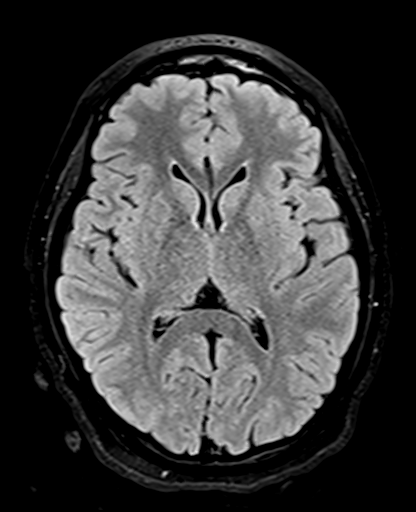
[im 42/42]
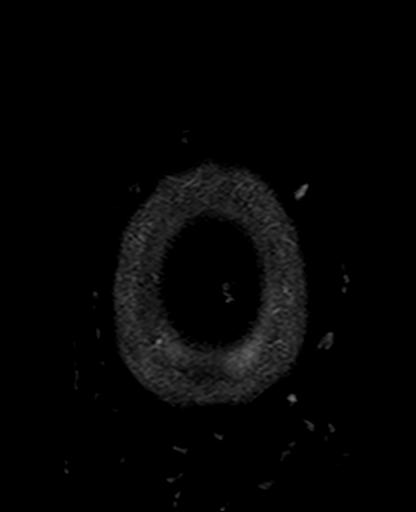

[Series 8001: T1 · sagittal · 5.0mm · 0.72mm/px · 2 of 31 slices shown]
[im 1/31]
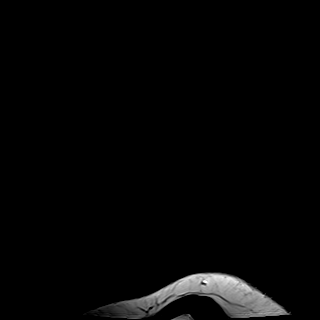
[im 31/31]
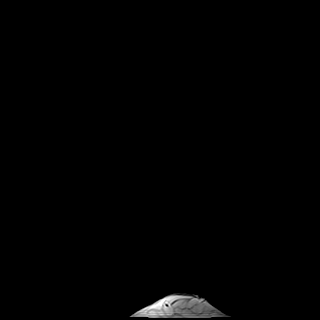

[Series 9001: T2 · axial · 4.0mm · 0.72mm/px · z∈[-104,+40]mm · 2 of 30 slices shown]
[im 1/30]
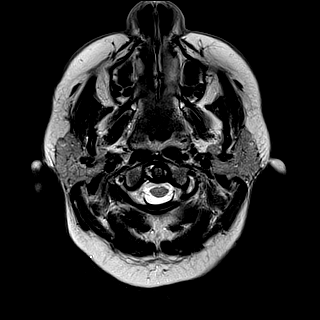
[im 30/30]
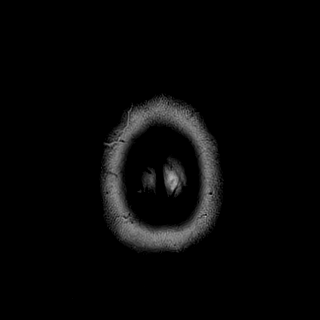

[t2_space_dark-fluid_sag_p2_ns-ir · sagittal · 1.0mm · 0.49mm/px · 11 of 192 slices shown]
[im 1/192]
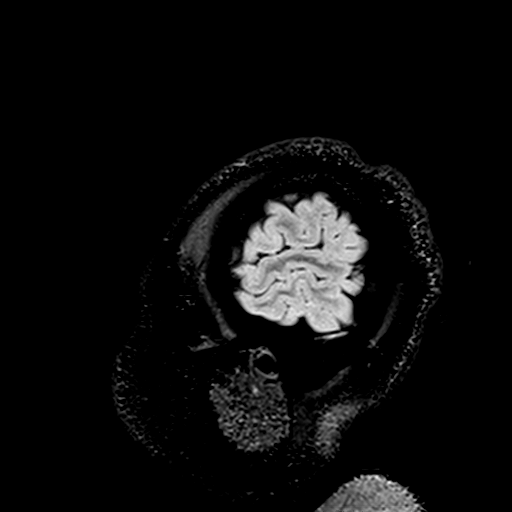
[im 18/192]
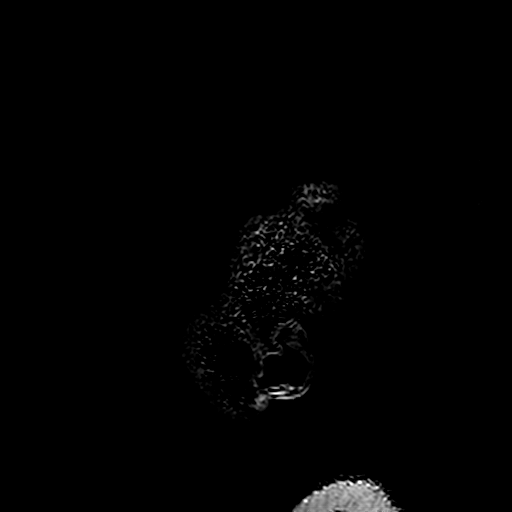
[im 35/192]
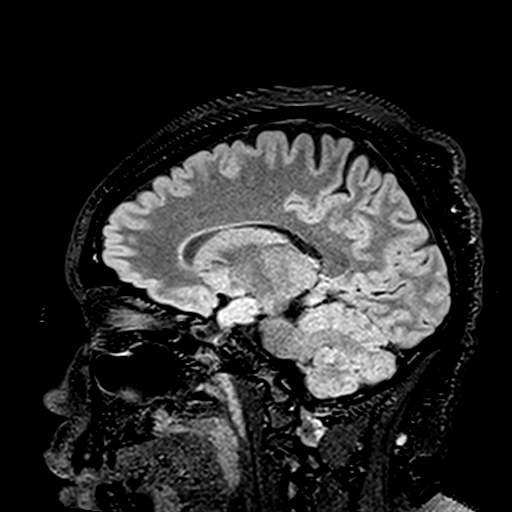
[im 53/192]
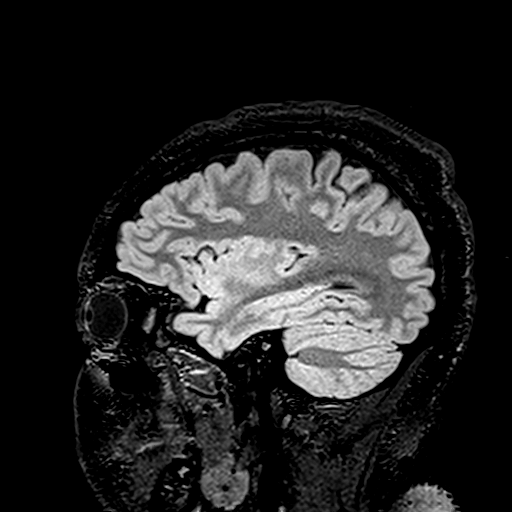
[im 70/192]
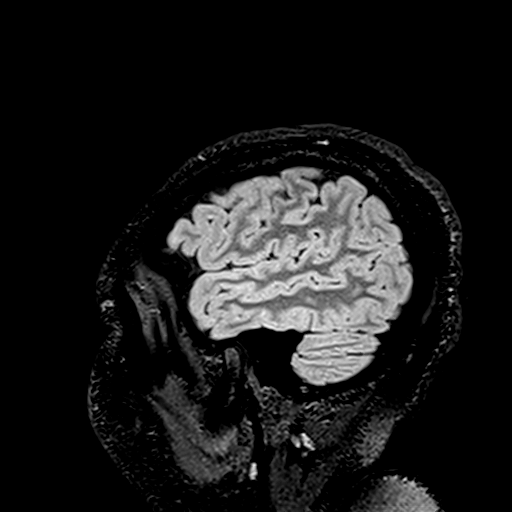
[im 87/192]
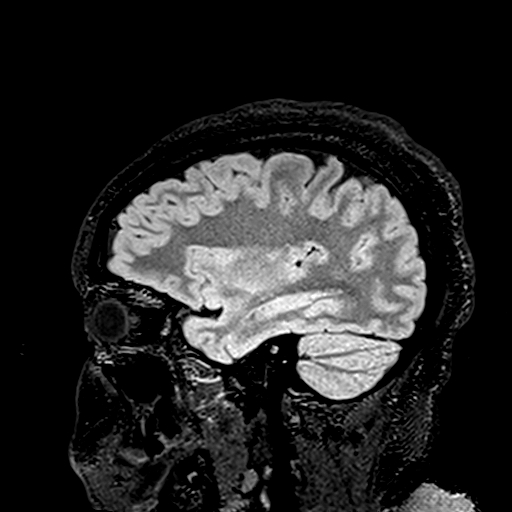
[im 105/192]
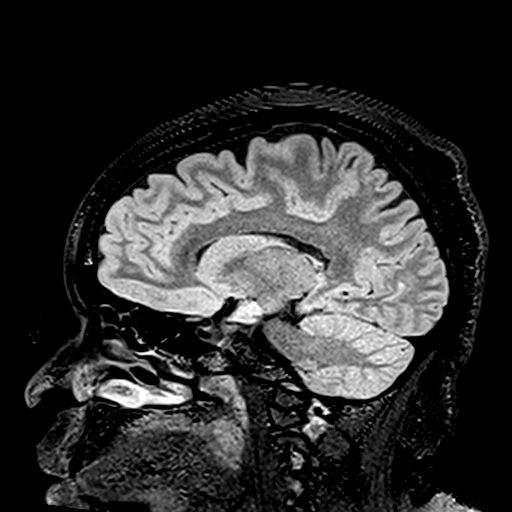
[im 122/192]
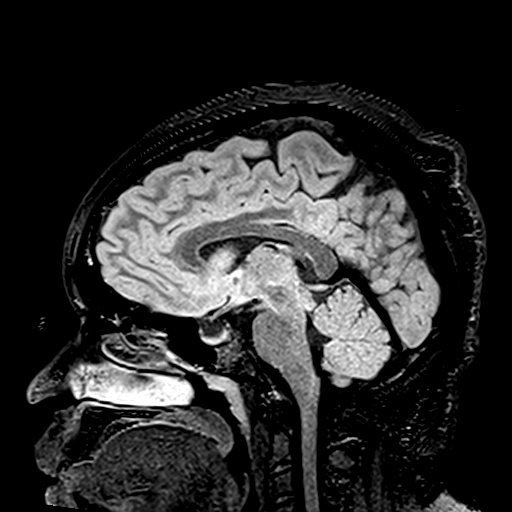
[im 139/192]
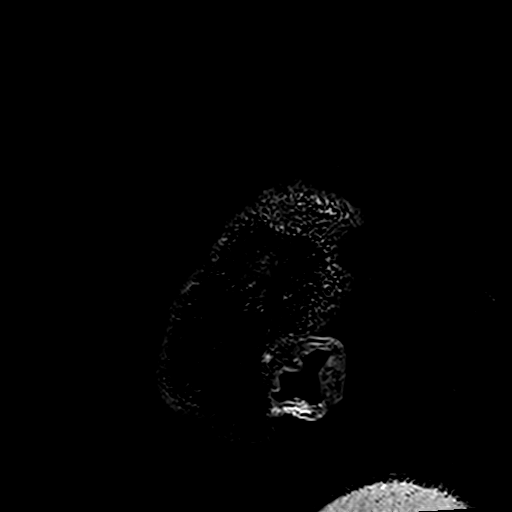
[im 157/192]
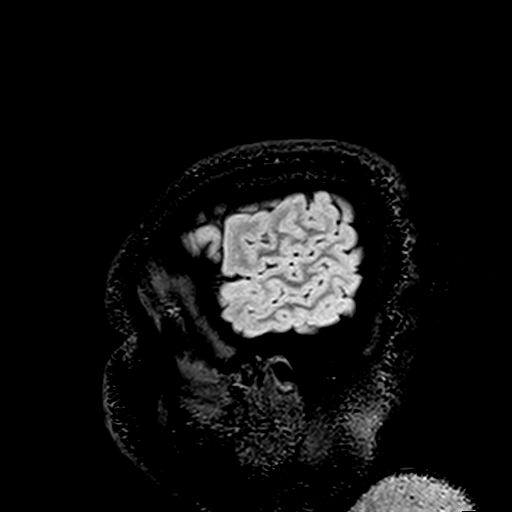
[im 192/192]
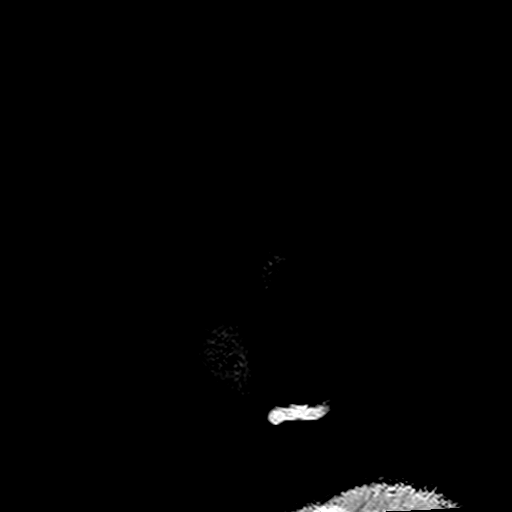

[T2 post-contrast · coronal · 5.0mm · 0.72mm/px · 2 of 28 slices shown]
[im 1/28]
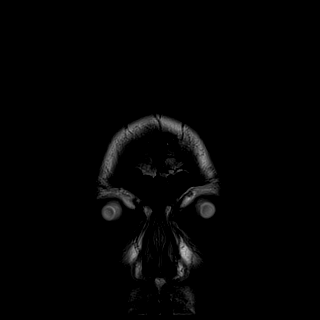
[im 28/28]
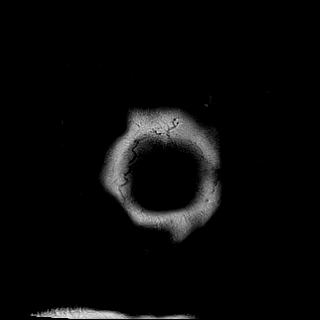

[T1 post-contrast · coronal · 5.0mm · 0.34mm/px · 2 of 28 slices shown]
[im 1/28]
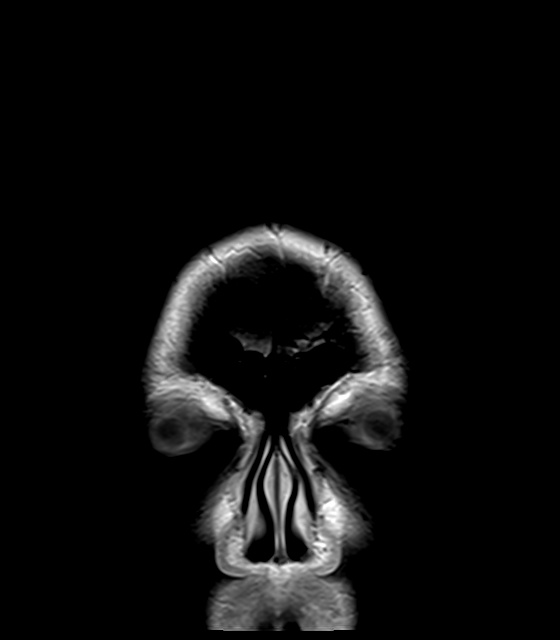
[im 28/28]
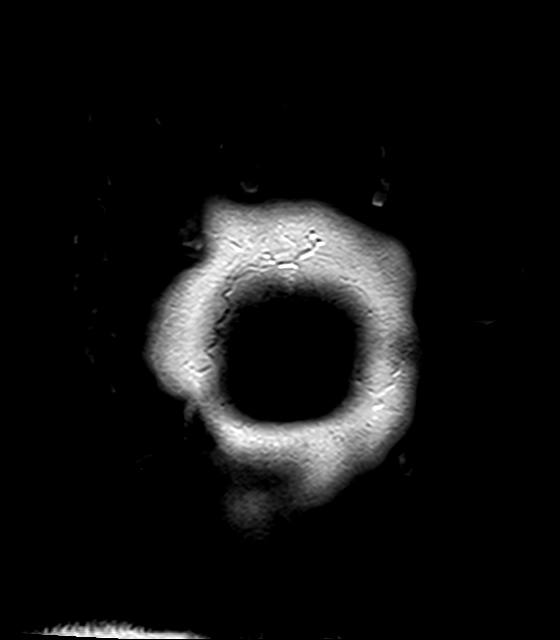

[27 of 48 positions shown; findings below may reference images not displayed]

FINDINGS: Brain: Cerebral volume within normal limits. No focal parenchymal
signal abnormality. No abnormal foci of restricted diffusion to
suggest acute or subacute ischemia. Gray-white matter
differentiation maintained. No encephalomalacia to suggest chronic
infarction.

No mass lesion, midline shift or mass effect. No hydrocephalus. No
extra-axial fluid collection. Major dural sinuses grossly patent. No
abnormal enhancement.

Incidental note made of a partially empty sella. Suprasellar region
normal. Midline structures intact.

Vascular: Major intracranial vascular flow voids are maintained and
are normal in appearance.

Skull and upper cervical spine: Craniocervical junction within
normal limits. Upper cervical spine unremarkable. Bone marrow signal
intensity within normal limits. No scalp soft tissue abnormality.

Sinuses/Orbits: Globes and orbital soft tissues within normal
limits. Paranasal sinuses are clear. No mastoid effusion. Inner ear
structures normal.

Other: None.
IMPRESSION: 1. Negative brain MRI. No acute intracranial abnormality identified.
2. Partially empty sella.

## 2020-03-12 IMAGING — CT CT HEAD W/O CM
3 series · 15 of 47 positions shown, 18 images · non-contrast
Comparison: None.

CLINICAL DATA: Drooping right eyelid and double vision since
[REDACTED].

EXAM:
CT HEAD WITHOUT CONTRAST
TECHNIQUE: Contiguous axial images were obtained from the base of the skull
through the vertex without intravenous contrast.

[Series 3: head 5.0 h30s · axial · 0.46mm/px · z∈[-199,-59]mm · 9 of 34 slices shown, 12 images]
[im 3/34  brain]
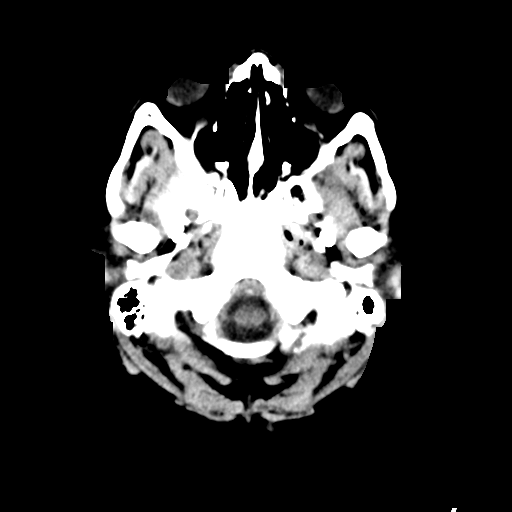
[im 3/34  bone]
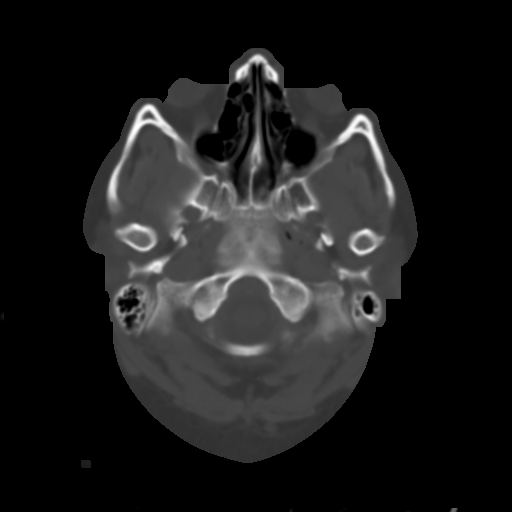
[im 6/34  brain]
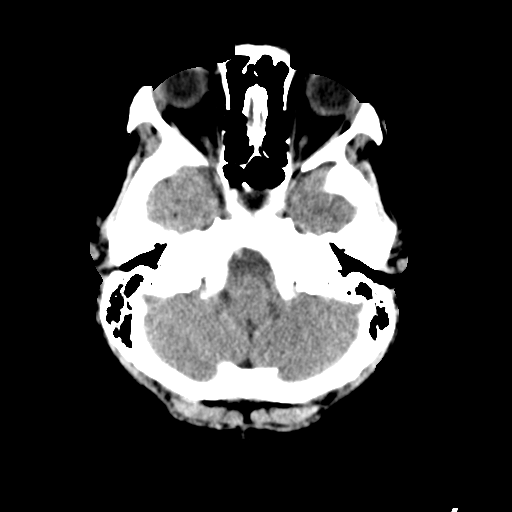
[im 10/34  brain]
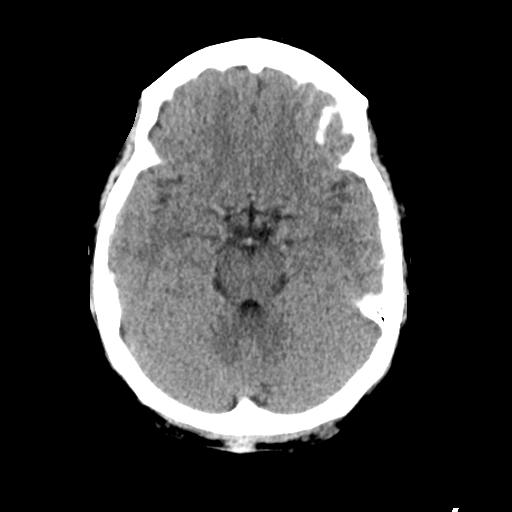
[im 13/34  brain]
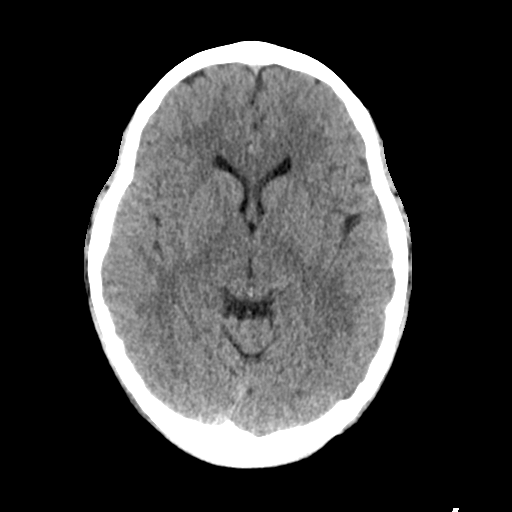
[im 18/34  brain]
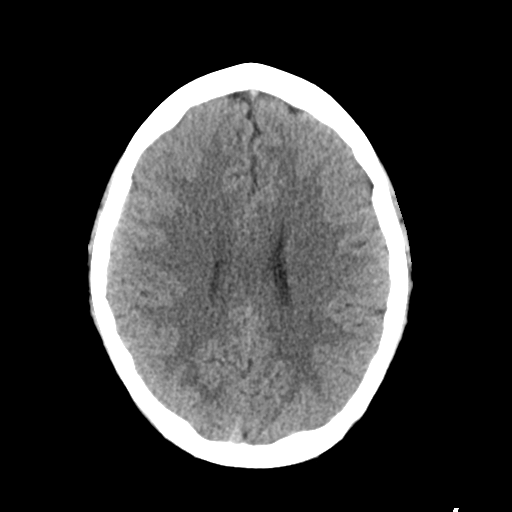
[im 18/34  bone]
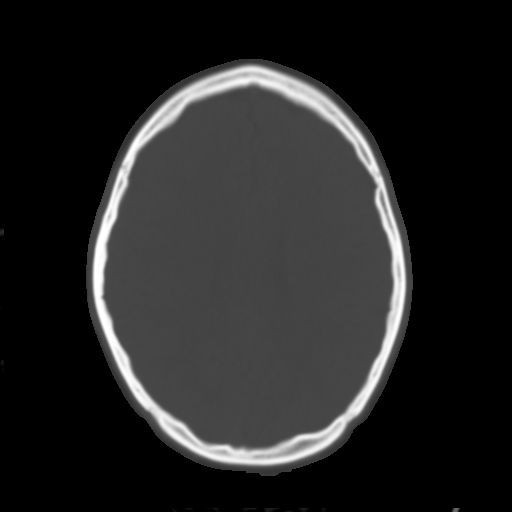
[im 21/34  brain]
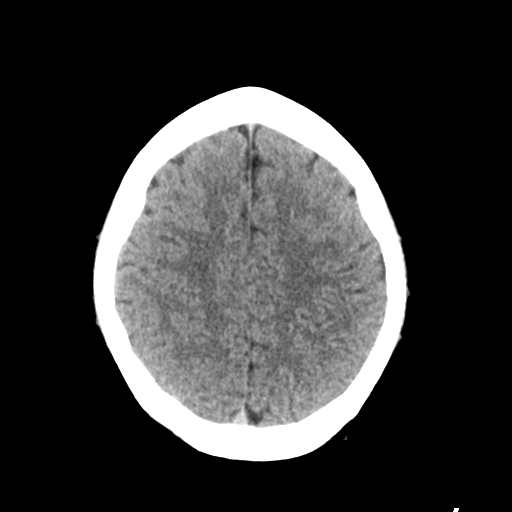
[im 24/34  brain]
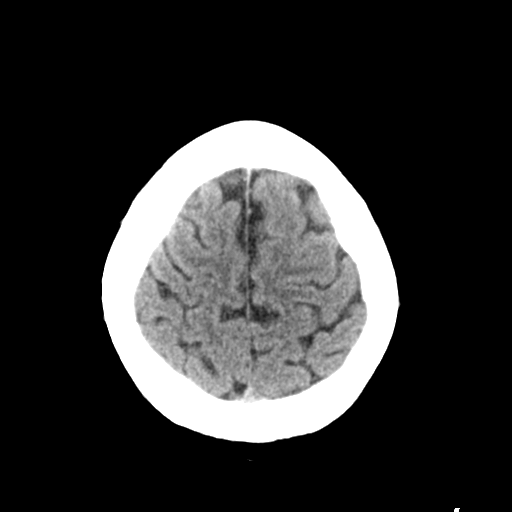
[im 28/34  brain]
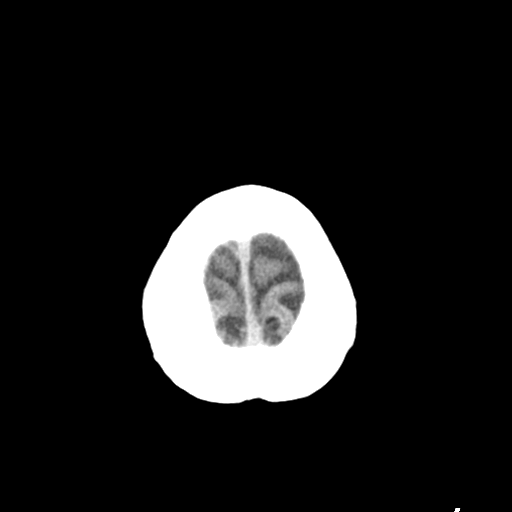
[im 31/34  brain]
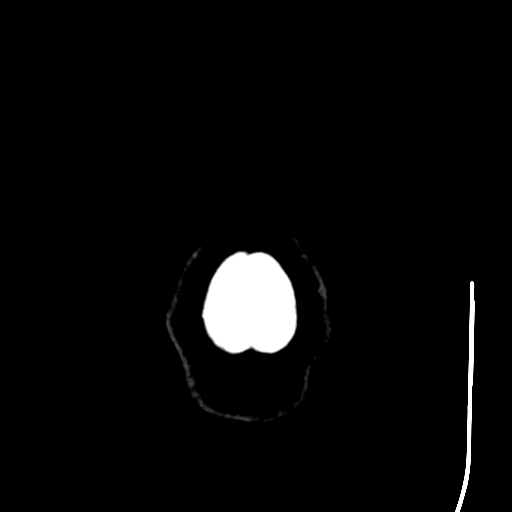
[im 31/34  bone]
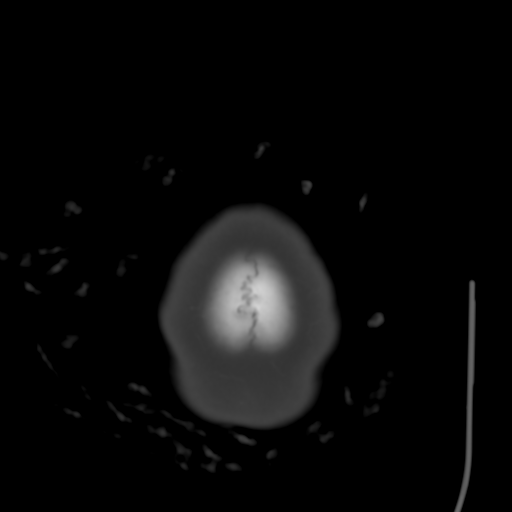

[Series 5: head 3.0 mpr cor · coronal · 0.32mm/px · 3 of 71 slices shown]
[im 24/71  brain]
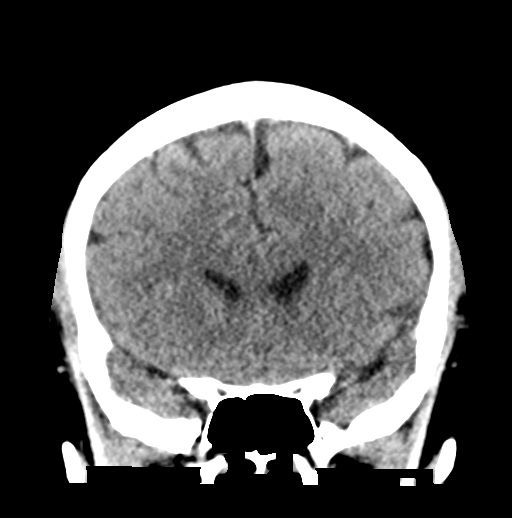
[im 32/71  brain]
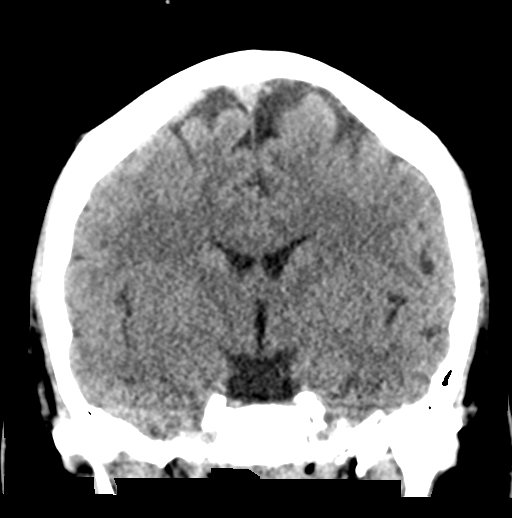
[im 39/71  brain]
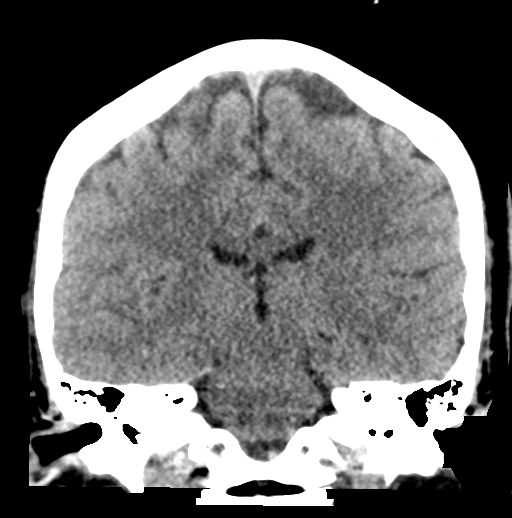

[Series 6: head 3.0 mpr sag · sagittal · 0.32mm/px · 3 of 58 slices shown]
[im 20/58  brain]
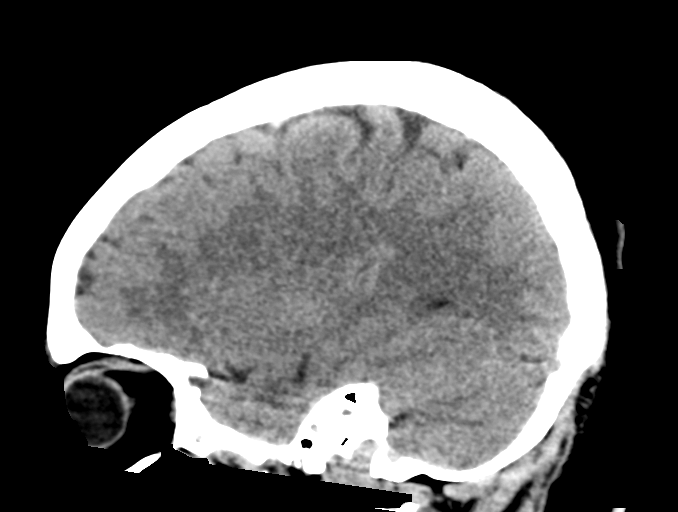
[im 29/58  brain]
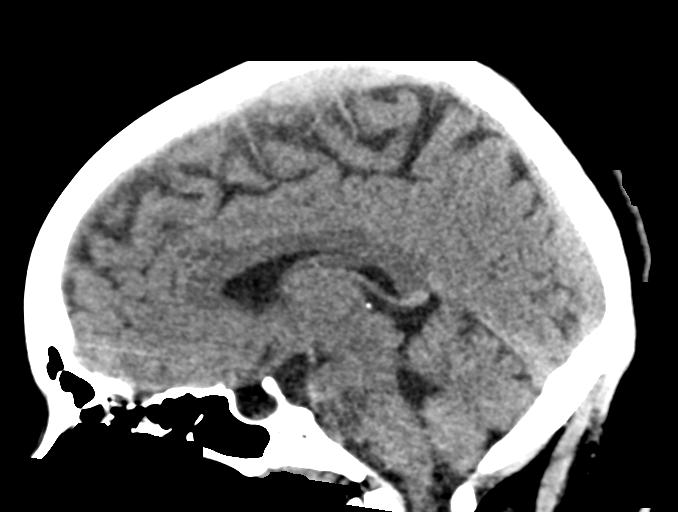
[im 39/58  brain]
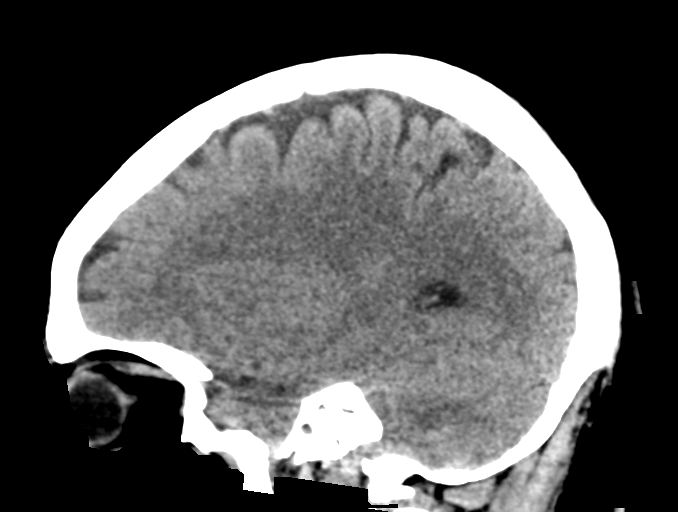

[15 of 47 positions shown; findings below may reference images not displayed]

FINDINGS: BRAIN: The ventricles and sulci are normal. No intraparenchymal
hemorrhage, mass effect nor midline shift. No acute large vascular
territory infarcts. Grey-white matter distinction is maintained. The
basal ganglia are unremarkable. No abnormal extra-axial fluid
collections. Basal cisterns are not effaced and midline. The
brainstem and cerebellar hemispheres are without acute
abnormalities. Anatomic variant partially empty pituitary sella.

VASCULAR: Unremarkable.

SKULL/SOFT TISSUES: No skull fracture. No significant soft tissue
swelling.

ORBITS/SINUSES: The included ocular globes and orbital contents are
normal.The mastoid air cells are clear. The included paranasal
sinuses are well-aerated.

OTHER: None.
IMPRESSION: Anatomic variant partially empty pituitary sella. No acute
intracranial abnormality.
